# Patient Record
Sex: Female | Born: 1935 | Race: White | Hispanic: No | Marital: Married | Smoking: Never smoker
Health system: Southern US, Community
[De-identification: ages and names within clinical notes are randomized; demographics above are authoritative.]

## PROBLEM LIST (undated history)

## (undated) DIAGNOSIS — I714 Abdominal aortic aneurysm, without rupture, unspecified: Secondary | ICD-10-CM

## (undated) DIAGNOSIS — D492 Neoplasm of unspecified behavior of bone, soft tissue, and skin: Secondary | ICD-10-CM

## (undated) DIAGNOSIS — M79672 Pain in left foot: Secondary | ICD-10-CM

## (undated) DIAGNOSIS — R51 Headache: Secondary | ICD-10-CM

## (undated) DIAGNOSIS — M1990 Unspecified osteoarthritis, unspecified site: Secondary | ICD-10-CM

## (undated) DIAGNOSIS — E559 Vitamin D deficiency, unspecified: Secondary | ICD-10-CM

## (undated) DIAGNOSIS — I2510 Atherosclerotic heart disease of native coronary artery without angina pectoris: Secondary | ICD-10-CM

## (undated) DIAGNOSIS — E782 Mixed hyperlipidemia: Secondary | ICD-10-CM

## (undated) DIAGNOSIS — I493 Ventricular premature depolarization: Secondary | ICD-10-CM

## (undated) DIAGNOSIS — M899 Disorder of bone, unspecified: Secondary | ICD-10-CM

## (undated) DIAGNOSIS — R0689 Other abnormalities of breathing: Secondary | ICD-10-CM

## (undated) DIAGNOSIS — N959 Unspecified menopausal and perimenopausal disorder: Secondary | ICD-10-CM

## (undated) DIAGNOSIS — M949 Disorder of cartilage, unspecified: Secondary | ICD-10-CM

## (undated) DIAGNOSIS — D3709 Neoplasm of uncertain behavior of other specified sites of the oral cavity: Secondary | ICD-10-CM

## (undated) DIAGNOSIS — E785 Hyperlipidemia, unspecified: Secondary | ICD-10-CM

## (undated) DIAGNOSIS — I10 Essential (primary) hypertension: Secondary | ICD-10-CM

## (undated) DIAGNOSIS — N815 Vaginal enterocele: Secondary | ICD-10-CM

## (undated) DIAGNOSIS — R0602 Shortness of breath: Secondary | ICD-10-CM

## (undated) DIAGNOSIS — N3941 Urge incontinence: Secondary | ICD-10-CM

## (undated) DIAGNOSIS — Z7982 Long term (current) use of aspirin: Secondary | ICD-10-CM

## (undated) DIAGNOSIS — R002 Palpitations: Secondary | ICD-10-CM

## (undated) DIAGNOSIS — M8580 Other specified disorders of bone density and structure, unspecified site: Secondary | ICD-10-CM

## (undated) DIAGNOSIS — M25872 Other specified joint disorders, left ankle and foot: Secondary | ICD-10-CM

## (undated) DIAGNOSIS — R7309 Other abnormal glucose: Secondary | ICD-10-CM

## (undated) DIAGNOSIS — R0600 Dyspnea, unspecified: Secondary | ICD-10-CM

## (undated) DIAGNOSIS — E039 Hypothyroidism, unspecified: Secondary | ICD-10-CM

## (undated) DIAGNOSIS — R7303 Prediabetes: Secondary | ICD-10-CM

## (undated) DIAGNOSIS — R319 Hematuria, unspecified: Secondary | ICD-10-CM

## (undated) DIAGNOSIS — R030 Elevated blood-pressure reading, without diagnosis of hypertension: Secondary | ICD-10-CM

## (undated) DIAGNOSIS — R5381 Other malaise: Secondary | ICD-10-CM

## (undated) DIAGNOSIS — R0989 Other specified symptoms and signs involving the circulatory and respiratory systems: Secondary | ICD-10-CM

## (undated) DIAGNOSIS — Z01818 Encounter for other preprocedural examination: Secondary | ICD-10-CM

## (undated) DIAGNOSIS — R5383 Other fatigue: Secondary | ICD-10-CM

## (undated) DIAGNOSIS — J301 Allergic rhinitis due to pollen: Secondary | ICD-10-CM

## (undated) DIAGNOSIS — I471 Supraventricular tachycardia: Secondary | ICD-10-CM

## (undated) DIAGNOSIS — I658 Occlusion and stenosis of other precerebral arteries: Secondary | ICD-10-CM

## (undated) DIAGNOSIS — Z01419 Encounter for gynecological examination (general) (routine) without abnormal findings: Secondary | ICD-10-CM

## (undated) DIAGNOSIS — E669 Obesity, unspecified: Secondary | ICD-10-CM

## (undated) DIAGNOSIS — R600 Localized edema: Secondary | ICD-10-CM

## (undated) DIAGNOSIS — I6529 Occlusion and stenosis of unspecified carotid artery: Secondary | ICD-10-CM

## (undated) DIAGNOSIS — I6523 Occlusion and stenosis of bilateral carotid arteries: Secondary | ICD-10-CM

## (undated) DIAGNOSIS — M19079 Primary osteoarthritis, unspecified ankle and foot: Secondary | ICD-10-CM

## (undated) DIAGNOSIS — D3701 Neoplasm of uncertain behavior of lip: Secondary | ICD-10-CM

## (undated) DIAGNOSIS — N8111 Cystocele, midline: Secondary | ICD-10-CM

## (undated) DIAGNOSIS — Z79899 Other long term (current) drug therapy: Secondary | ICD-10-CM

## (undated) DIAGNOSIS — R9430 Abnormal result of cardiovascular function study, unspecified: Secondary | ICD-10-CM

## (undated) DIAGNOSIS — R55 Syncope and collapse: Secondary | ICD-10-CM

## (undated) DIAGNOSIS — K599 Functional intestinal disorder, unspecified: Secondary | ICD-10-CM

## (undated) DIAGNOSIS — N200 Calculus of kidney: Secondary | ICD-10-CM

## (undated) DIAGNOSIS — R079 Chest pain, unspecified: Secondary | ICD-10-CM

## (undated) DIAGNOSIS — N8110 Cystocele, unspecified: Secondary | ICD-10-CM

## (undated) DIAGNOSIS — N8112 Cystocele, lateral: Secondary | ICD-10-CM

## (undated) DIAGNOSIS — D3705 Neoplasm of uncertain behavior of pharynx: Secondary | ICD-10-CM

## (undated) HISTORY — DX: Abdominal aortic aneurysm, without rupture, unspecified: I71.40

## (undated) HISTORY — DX: Neoplasm of unspecified behavior of bone, soft tissue, and skin: D49.2

## (undated) HISTORY — DX: Chest pain, unspecified: R07.9

## (undated) HISTORY — DX: Hyperlipidemia, unspecified: E78.5

## (undated) HISTORY — DX: Cystocele, unspecified: N81.10

## (undated) HISTORY — DX: Hematuria, unspecified: R31.9

## (undated) HISTORY — DX: Urge incontinence: N39.41

## (undated) HISTORY — DX: Other specified joint disorders, left ankle and foot: M25.872

## (undated) HISTORY — PX: CYSTOSCOPY: SUR368

## (undated) HISTORY — DX: Headache: R51

## (undated) HISTORY — DX: Morbid (severe) obesity due to excess calories: E66.01

## (undated) HISTORY — PX: REPLACEMENT TOTAL KNEE: SUR1224

## (undated) HISTORY — DX: Hypothyroidism, unspecified: E03.9

## (undated) HISTORY — DX: Elevated blood-pressure reading, without diagnosis of hypertension: R03.0

## (undated) HISTORY — DX: Calculus of kidney: N20.0

## (undated) HISTORY — DX: Other malaise: R53.81

## (undated) HISTORY — DX: Palpitations: R00.2

## (undated) HISTORY — DX: Neoplasm of uncertain behavior of pharynx: D37.05

## (undated) HISTORY — DX: Vitamin D deficiency, unspecified: E55.9

## (undated) HISTORY — DX: Mixed hyperlipidemia: E78.2

## (undated) HISTORY — DX: Unspecified osteoarthritis, unspecified site: M19.90

## (undated) HISTORY — DX: Long term (current) use of aspirin: Z79.82

## (undated) HISTORY — DX: Allergic rhinitis due to pollen: J30.1

## (undated) HISTORY — DX: Encounter for other preprocedural examination: Z01.818

## (undated) HISTORY — DX: Cystocele, midline: N81.11

## (undated) HISTORY — DX: Occlusion and stenosis of bilateral carotid arteries: I65.23

## (undated) HISTORY — DX: Unspecified menopausal and perimenopausal disorder: N95.9

## (undated) HISTORY — DX: Dyspnea, unspecified: R06.00

## (undated) HISTORY — DX: Essential (primary) hypertension: I10

## (undated) HISTORY — DX: Localized edema: R60.0

## (undated) HISTORY — DX: Other specified disorders of bone density and structure, unspecified site: M85.80

## (undated) HISTORY — DX: Supraventricular tachycardia: I47.1

## (undated) HISTORY — DX: Ventricular premature depolarization: I49.3

## (undated) HISTORY — DX: Primary osteoarthritis, unspecified ankle and foot: M19.079

## (undated) HISTORY — DX: Other long term (current) drug therapy: Z79.899

## (undated) HISTORY — DX: Other specified symptoms and signs involving the circulatory and respiratory systems: R09.89

## (undated) HISTORY — DX: Neoplasm of uncertain behavior of other specified sites of the oral cavity: D37.09

## (undated) HISTORY — DX: Shortness of breath: R06.02

## (undated) HISTORY — DX: Encounter for gynecological examination (general) (routine) without abnormal findings: Z01.419

## (undated) HISTORY — DX: Prediabetes: R73.03

## (undated) HISTORY — DX: Vaginal enterocele: N81.5

## (undated) HISTORY — DX: Other abnormal glucose: R73.09

## (undated) HISTORY — DX: Functional intestinal disorder, unspecified: K59.9

## (undated) HISTORY — DX: Other fatigue: R53.83

## (undated) HISTORY — DX: Abdominal aortic aneurysm, without rupture: I71.4

## (undated) HISTORY — DX: Neoplasm of uncertain behavior of lip: D37.01

## (undated) HISTORY — DX: Cystocele, lateral: N81.12

## (undated) HISTORY — DX: Occlusion and stenosis of unspecified carotid artery: I65.29

## (undated) HISTORY — DX: Abnormal result of cardiovascular function study, unspecified: R94.30

## (undated) HISTORY — DX: Syncope and collapse: R55

## (undated) HISTORY — DX: Occlusion and stenosis of other precerebral arteries: I65.8

## (undated) HISTORY — DX: Pain in left foot: M79.672

## (undated) HISTORY — DX: Disorder of bone, unspecified: M89.9

## (undated) HISTORY — DX: Disorder of cartilage, unspecified: M94.9

## (undated) HISTORY — DX: Other abnormalities of breathing: R06.89

## (undated) HISTORY — DX: Obesity, unspecified: E66.9

---

## 1998-06-03 ENCOUNTER — Emergency Department (HOSPITAL_COMMUNITY): Admission: EM | Admit: 1998-06-03 | Discharge: 1998-06-03 | Payer: Self-pay | Admitting: Emergency Medicine

## 1998-06-03 ENCOUNTER — Encounter: Payer: Self-pay | Admitting: Emergency Medicine

## 2004-07-15 ENCOUNTER — Emergency Department (HOSPITAL_COMMUNITY): Admission: EM | Admit: 2004-07-15 | Discharge: 2004-07-15 | Payer: Self-pay | Admitting: Podiatry

## 2004-12-05 ENCOUNTER — Encounter: Admission: RE | Admit: 2004-12-05 | Discharge: 2004-12-05 | Payer: Self-pay | Admitting: Family Medicine

## 2005-12-10 ENCOUNTER — Encounter: Admission: RE | Admit: 2005-12-10 | Discharge: 2005-12-10 | Payer: Self-pay | Admitting: Family Medicine

## 2006-12-25 ENCOUNTER — Encounter: Admission: RE | Admit: 2006-12-25 | Discharge: 2006-12-25 | Payer: Self-pay | Admitting: Family Medicine

## 2007-12-26 ENCOUNTER — Encounter: Admission: RE | Admit: 2007-12-26 | Discharge: 2007-12-26 | Payer: Self-pay | Admitting: Family Medicine

## 2009-01-03 ENCOUNTER — Encounter: Admission: RE | Admit: 2009-01-03 | Discharge: 2009-01-03 | Payer: Self-pay | Admitting: Family Medicine

## 2010-01-05 ENCOUNTER — Encounter: Admission: RE | Admit: 2010-01-05 | Discharge: 2010-01-05 | Payer: Self-pay | Admitting: Family Medicine

## 2010-12-13 ENCOUNTER — Other Ambulatory Visit: Payer: Self-pay | Admitting: Family Medicine

## 2010-12-13 DIAGNOSIS — Z1231 Encounter for screening mammogram for malignant neoplasm of breast: Secondary | ICD-10-CM

## 2011-01-17 ENCOUNTER — Ambulatory Visit: Payer: Self-pay

## 2011-01-22 ENCOUNTER — Ambulatory Visit
Admission: RE | Admit: 2011-01-22 | Discharge: 2011-01-22 | Disposition: A | Discharge status: Home / Self Care | Payer: Medicare Other | Source: Ambulatory Visit | Attending: Family Medicine | Admitting: Family Medicine

## 2011-01-22 DIAGNOSIS — Z1231 Encounter for screening mammogram for malignant neoplasm of breast: Secondary | ICD-10-CM

## 2011-11-02 DIAGNOSIS — D3701 Neoplasm of uncertain behavior of lip: Secondary | ICD-10-CM | POA: Insufficient documentation

## 2011-11-02 DIAGNOSIS — D3709 Neoplasm of uncertain behavior of other specified sites of the oral cavity: Secondary | ICD-10-CM

## 2011-11-02 DIAGNOSIS — D3705 Neoplasm of uncertain behavior of pharynx: Secondary | ICD-10-CM

## 2011-11-02 DIAGNOSIS — J301 Allergic rhinitis due to pollen: Secondary | ICD-10-CM

## 2011-11-02 HISTORY — DX: Allergic rhinitis due to pollen: J30.1

## 2011-11-02 HISTORY — DX: Neoplasm of uncertain behavior of lip: D37.01

## 2012-03-07 ENCOUNTER — Other Ambulatory Visit: Payer: Self-pay | Admitting: Family Medicine

## 2012-03-07 DIAGNOSIS — Z1231 Encounter for screening mammogram for malignant neoplasm of breast: Secondary | ICD-10-CM

## 2012-04-16 ENCOUNTER — Ambulatory Visit
Admission: RE | Admit: 2012-04-16 | Discharge: 2012-04-16 | Disposition: A | Discharge status: Home / Self Care | Payer: Medicare Other | Source: Ambulatory Visit | Attending: Family Medicine | Admitting: Family Medicine

## 2012-04-16 DIAGNOSIS — Z1231 Encounter for screening mammogram for malignant neoplasm of breast: Secondary | ICD-10-CM

## 2012-12-09 DIAGNOSIS — R0689 Other abnormalities of breathing: Secondary | ICD-10-CM

## 2012-12-09 DIAGNOSIS — R5383 Other fatigue: Secondary | ICD-10-CM

## 2012-12-09 DIAGNOSIS — I658 Occlusion and stenosis of other precerebral arteries: Secondary | ICD-10-CM

## 2012-12-09 DIAGNOSIS — I10 Essential (primary) hypertension: Secondary | ICD-10-CM | POA: Insufficient documentation

## 2012-12-09 DIAGNOSIS — R5381 Other malaise: Secondary | ICD-10-CM

## 2012-12-09 DIAGNOSIS — R0989 Other specified symptoms and signs involving the circulatory and respiratory systems: Secondary | ICD-10-CM

## 2012-12-09 DIAGNOSIS — R0600 Dyspnea, unspecified: Secondary | ICD-10-CM | POA: Insufficient documentation

## 2012-12-09 DIAGNOSIS — I6529 Occlusion and stenosis of unspecified carotid artery: Secondary | ICD-10-CM | POA: Insufficient documentation

## 2012-12-09 DIAGNOSIS — Z7982 Long term (current) use of aspirin: Secondary | ICD-10-CM

## 2012-12-09 DIAGNOSIS — R030 Elevated blood-pressure reading, without diagnosis of hypertension: Secondary | ICD-10-CM | POA: Insufficient documentation

## 2012-12-09 DIAGNOSIS — R519 Headache, unspecified: Secondary | ICD-10-CM

## 2012-12-09 DIAGNOSIS — R079 Chest pain, unspecified: Secondary | ICD-10-CM

## 2012-12-09 DIAGNOSIS — Z79899 Other long term (current) drug therapy: Secondary | ICD-10-CM

## 2012-12-09 DIAGNOSIS — R0602 Shortness of breath: Secondary | ICD-10-CM | POA: Insufficient documentation

## 2012-12-09 DIAGNOSIS — R9430 Abnormal result of cardiovascular function study, unspecified: Secondary | ICD-10-CM

## 2012-12-09 DIAGNOSIS — Z01818 Encounter for other preprocedural examination: Secondary | ICD-10-CM

## 2012-12-09 DIAGNOSIS — I6523 Occlusion and stenosis of bilateral carotid arteries: Secondary | ICD-10-CM | POA: Insufficient documentation

## 2012-12-09 DIAGNOSIS — R51 Headache: Secondary | ICD-10-CM

## 2012-12-09 HISTORY — DX: Essential (primary) hypertension: I10

## 2012-12-09 HISTORY — DX: Dyspnea, unspecified: R06.00

## 2012-12-09 HISTORY — DX: Occlusion and stenosis of unspecified carotid artery: I65.29

## 2012-12-09 HISTORY — DX: Dyspnea, unspecified: R06.89

## 2012-12-09 HISTORY — DX: Occlusion and stenosis of other precerebral arteries: I65.8

## 2012-12-09 HISTORY — DX: Abnormal result of cardiovascular function study, unspecified: R94.30

## 2012-12-09 HISTORY — DX: Elevated blood-pressure reading, without diagnosis of hypertension: R03.0

## 2012-12-09 HISTORY — DX: Other fatigue: R53.83

## 2012-12-09 HISTORY — DX: Occlusion and stenosis of bilateral carotid arteries: I65.23

## 2012-12-09 HISTORY — DX: Other long term (current) drug therapy: Z79.899

## 2012-12-09 HISTORY — DX: Chest pain, unspecified: R07.9

## 2012-12-09 HISTORY — DX: Long term (current) use of aspirin: Z79.82

## 2012-12-09 HISTORY — DX: Encounter for other preprocedural examination: Z01.818

## 2012-12-09 HISTORY — DX: Shortness of breath: R06.02

## 2012-12-09 HISTORY — DX: Other malaise: R53.81

## 2012-12-09 HISTORY — DX: Headache, unspecified: R51.9

## 2012-12-09 HISTORY — DX: Other specified symptoms and signs involving the circulatory and respiratory systems: R09.89

## 2013-05-04 ENCOUNTER — Other Ambulatory Visit: Payer: Self-pay

## 2013-05-04 DIAGNOSIS — Z1231 Encounter for screening mammogram for malignant neoplasm of breast: Secondary | ICD-10-CM

## 2013-06-05 ENCOUNTER — Ambulatory Visit
Admission: RE | Admit: 2013-06-05 | Discharge: 2013-06-05 | Disposition: A | Discharge status: Home / Self Care | Payer: Medicare Other | Source: Ambulatory Visit

## 2013-06-05 DIAGNOSIS — Z1231 Encounter for screening mammogram for malignant neoplasm of breast: Secondary | ICD-10-CM

## 2013-10-05 ENCOUNTER — Encounter: Payer: Self-pay | Admitting: Cardiovascular Disease

## 2013-10-05 ENCOUNTER — Ambulatory Visit (INDEPENDENT_AMBULATORY_CARE_PROVIDER_SITE_OTHER): Payer: Medicare Other | Admitting: Cardiovascular Disease

## 2013-10-05 VITALS — BP 150/94 | HR 78 | Ht 64.0 in | Wt 214.0 lb

## 2013-10-05 DIAGNOSIS — R5383 Other fatigue: Secondary | ICD-10-CM

## 2013-10-05 DIAGNOSIS — R55 Syncope and collapse: Secondary | ICD-10-CM

## 2013-10-05 DIAGNOSIS — R002 Palpitations: Secondary | ICD-10-CM

## 2013-10-05 DIAGNOSIS — I10 Essential (primary) hypertension: Secondary | ICD-10-CM

## 2013-10-05 DIAGNOSIS — R5381 Other malaise: Secondary | ICD-10-CM

## 2013-10-05 HISTORY — DX: Palpitations: R00.2

## 2013-10-05 HISTORY — DX: Essential (primary) hypertension: I10

## 2013-10-05 HISTORY — DX: Syncope and collapse: R55

## 2013-10-05 MED ORDER — ACEBUTOLOL HCL 200 MG PO CAPS: 200.0000 mg | ORAL_CAPSULE | Freq: Every day | ORAL | Status: DC

## 2013-10-05 NOTE — Patient Instructions (Signed)
Your physician recommends that you schedule a follow-up appointment in: [place] physician has recommended you make the following change in your medication:  STOP  BYSTOLIC  START SECTRAL  546 MG  [place] physician has recommended that you wear an event monitor. Event monitors are medical devices that record the heart's electrical activity. Doctors most often [place] these monitors to diagnose arrhythmias. Arrhythmias are problems with the speed or rhythm of the heartbeat. The monitor is a small, portable device. You can wear one while you do your normal daily activities. This is usually used to diagnose what is causing palpitations/syncope (passing out).  Your physician has requested that you have an echocardiogram. Echocardiography is a painless test that uses sound waves to create images of your heart. It provides your doctor with information about the size and shape of your heart and how well your heart's chambers and valves are working. This procedure takes approximately one hour. There are no restrictions for this procedure.

## 2013-10-05 NOTE — Progress Notes (Signed)
Patient ID: Shannon Walker, female   DOB: September 02, 1932, 80 y.o.   MRN: [id number]   80 yo referred by Dr Bea Graff for palpitations.  She has HTN Rx with [employer] giving her hard time about it.  Previously seen by [facility] with normal stress tests and event monitor ? Results.  She has no documented cardiac disease.  She has flip flops mostly at night No sycope She also has these stereotypical spells last month where "something comes over me"  Feels drained and like she is going to pass out. Lasts a few minutes then she's tired all day  Not related to palpitations or postural changes.  No chest pain or dyspnea She seem a bit depressed and at times tired of caring for husband.  She wants to change bystolic.  Apparently insurance is giving her problem  Been on it for years for HTN  Borderline DM From obesity with A1c6.1 No other CAD risk factors   Hct 45.6  Cr .66  LDL 115 TSH 1.4      ROS: Denies fever, malais, weight loss, blurry vision, decreased visual acuity, cough, sputum, SOB, hemoptysis, pleuritic pain, palpitaitons, heartburn, abdominal pain, melena, lower extremity edema, claudication, or rash.  All other systems reviewed and negative   General: Affect appropriate Healthy:  appears stated age 80: normal Neck supple with no adenopathy JVP normal no bruits no thyromegaly Lungs clear with no wheezing and good diaphragmatic motion Heart:  S1/S2 no murmur,rub, gallop or click PMI normal Abdomen: benighn, BS positve, no tenderness, no AAA no bruit.  No HSM or HJR Distal pulses intact with no bruits No edema Neuro non-focal Skin warm and dry No muscular weakness  Medications Current Outpatient Prescriptions  Medication Sig Dispense Refill  . Cholecalciferol (VITAMIN D PO) Take by mouth.      . levothyroxine (SYNTHROID, LEVOTHROID) 100 MCG tablet Take 100 mcg by mouth daily before breakfast.      . nebivolol (BYSTOLIC) 5 MG tablet Take 5 mg by mouth daily.      . Bromfenac  Sodium (PROLENSA) 0.07 % SOLN Apply to eye daily.      Marland Kitchen gatifloxacin (ZYMAXID) 0.5 % SOLN 1 drop.       No current facility-administered medications for this visit.    Allergies Iodine  Family History: No family history on file.  Social History: History   Social History  . Marital Status: Married    Spouse Name: N/A    Number of Children: N/A  . Years of Education: N/A   Occupational History  . Not on file.   Social History Main Topics  . Smoking status: Never Smoker   . Smokeless tobacco: Not on file  . Alcohol Use: Not on file  . Drug Use: Not on file  . Sexual Activity: Not on file   Other Topics Concern  . Not on file   Social History Narrative  . No narrative on file    Electrocardiogram:  SR rate 78 poor R wave progression cannot r/o anterior MI   Assessment and Plan

## 2013-10-05 NOTE — Assessment & Plan Note (Signed)
Not clear why sympathomimetic agent chosen in first place.  She wants a new one  Will see if sectral is covered by insurance

## 2013-10-05 NOTE — Assessment & Plan Note (Signed)
Benign sounding non exertional and more at night  Event monitor

## 2013-10-05 NOTE — Assessment & Plan Note (Signed)
Etiology not clear with normal exam and no dyspnea or chest pain doubt cardiac etiology Check event monitior  and echo

## 2013-10-14 ENCOUNTER — Encounter (INDEPENDENT_AMBULATORY_CARE_PROVIDER_SITE_OTHER): Payer: Medicare Other

## 2013-10-14 ENCOUNTER — Ambulatory Visit (HOSPITAL_COMMUNITY): Payer: Medicare Other | Attending: Internal Medicine | Admitting: Cardiology

## 2013-10-14 ENCOUNTER — Encounter: Payer: Self-pay | Admitting: *Deleted

## 2013-10-14 DIAGNOSIS — R002 Palpitations: Secondary | ICD-10-CM

## 2013-10-14 DIAGNOSIS — R55 Syncope and collapse: Secondary | ICD-10-CM

## 2013-10-14 DIAGNOSIS — R5383 Other fatigue: Secondary | ICD-10-CM

## 2013-10-14 NOTE — Progress Notes (Signed)
Patient ID: Shannon Walker, female   DOB: 31-Aug-1932, 80 y.o.   MRN: [id number] E-Cardio verite 30 day cardiac event monitor applied to patient.

## 2013-10-14 NOTE — Progress Notes (Signed)
Echo performed. 

## 2013-11-20 ENCOUNTER — Telehealth: Payer: Self-pay | Admitting: *Deleted

## 2013-11-20 NOTE — Telephone Encounter (Signed)
PT AWARE OF MONITOR  RESULTS  PER  DR NISHAN  NSR  ARTIFACT  NO ARRHYTHMIA ./CY

## 2014-01-08 ENCOUNTER — Telehealth: Payer: Self-pay | Admitting: Cardiovascular Disease

## 2014-01-08 NOTE — Telephone Encounter (Signed)
CALLED OFFICE   RECORDING  STATES  OFFICE IS CLOSED FOR THE  DAY .Adonis Housekeeper

## 2014-01-08 NOTE — Telephone Encounter (Signed)
New message      Question about patients bp medication

## 2014-01-12 NOTE — Telephone Encounter (Signed)
CLOSED  FOR  LUNCH WILL TRY LATER .Shannon Walker

## 2014-01-15 NOTE — Telephone Encounter (Signed)
SPOKE WITH  [facility]  PT  THINKS   THAT   CURRENT MED  BYSTOLIC  IS NOT  THE PREFERRED  CHOICE  OF   DR Johnsie Cancel  . REVIEWED  OFFICE  NOTE APPEARS   THE  MAIN   ISSUE  IS  INS  COVERING  MED  PER  ANGIE  WILL DISCUSS WITH  PT .Adonis Housekeeper

## 2014-06-01 ENCOUNTER — Other Ambulatory Visit: Payer: Self-pay | Admitting: Internal Medicine

## 2014-06-01 DIAGNOSIS — R42 Dizziness and giddiness: Secondary | ICD-10-CM

## 2014-06-02 ENCOUNTER — Ambulatory Visit
Admission: RE | Admit: 2014-06-02 | Discharge: 2014-06-02 | Disposition: A | Discharge status: Home / Self Care | Payer: Medicare Other | Source: Ambulatory Visit | Attending: Internal Medicine | Admitting: Internal Medicine

## 2014-06-02 DIAGNOSIS — R42 Dizziness and giddiness: Secondary | ICD-10-CM

## 2014-06-02 MED ORDER — GADOBENATE DIMEGLUMINE 529 MG/ML IV SOLN
19.0000 mL | Freq: Once | INTRAVENOUS | Status: AC | PRN
Start: 2014-06-02 — End: 2014-06-02
  Administered 2014-06-02: 19 mL via INTRAVENOUS

## 2014-06-30 ENCOUNTER — Encounter (HOSPITAL_COMMUNITY): Payer: Self-pay | Admitting: Physical Medicine and Rehabilitation

## 2014-06-30 ENCOUNTER — Emergency Department (HOSPITAL_COMMUNITY): Payer: Medicare Other

## 2014-06-30 ENCOUNTER — Emergency Department (HOSPITAL_COMMUNITY)
Admission: EM | Admit: 2014-06-30 | Discharge: 2014-06-30 | Disposition: A | Discharge status: Home / Self Care | Payer: Medicare Other | Attending: Emergency Medicine | Admitting: Emergency Medicine

## 2014-06-30 DIAGNOSIS — Z87442 Personal history of urinary calculi: Secondary | ICD-10-CM | POA: Diagnosis not present

## 2014-06-30 DIAGNOSIS — Z79899 Other long term (current) drug therapy: Secondary | ICD-10-CM | POA: Insufficient documentation

## 2014-06-30 DIAGNOSIS — Z8639 Personal history of other endocrine, nutritional and metabolic disease: Secondary | ICD-10-CM | POA: Insufficient documentation

## 2014-06-30 DIAGNOSIS — Z87448 Personal history of other diseases of urinary system: Secondary | ICD-10-CM | POA: Insufficient documentation

## 2014-06-30 DIAGNOSIS — R002 Palpitations: Secondary | ICD-10-CM

## 2014-06-30 DIAGNOSIS — Z8739 Personal history of other diseases of the musculoskeletal system and connective tissue: Secondary | ICD-10-CM | POA: Insufficient documentation

## 2014-06-30 DIAGNOSIS — I10 Essential (primary) hypertension: Secondary | ICD-10-CM | POA: Insufficient documentation

## 2014-06-30 DIAGNOSIS — Z85828 Personal history of other malignant neoplasm of skin: Secondary | ICD-10-CM | POA: Diagnosis not present

## 2014-06-30 DIAGNOSIS — Z7982 Long term (current) use of aspirin: Secondary | ICD-10-CM | POA: Diagnosis not present

## 2014-06-30 LAB — CBC WITH DIFFERENTIAL/PLATELET
BASOS ABS: 0 10*3/uL (ref 0.0–0.1)
BASOS PCT: 0 % (ref 0–1)
EOS ABS: 0.3 10*3/uL (ref 0.0–0.7)
Eosinophils Relative: 4 % (ref 0–5)
HEMATOCRIT: 44.2 % (ref 36.0–46.0)
HEMOGLOBIN: 15.1 g/dL — AB (ref 12.0–15.0)
Lymphocytes Relative: 42 % (ref 12–46)
Lymphs Abs: 2.7 10*3/uL (ref 0.7–4.0)
MCH: 33 pg (ref 26.0–34.0)
MCHC: 34.2 g/dL (ref 30.0–36.0)
MCV: 96.5 fL (ref 78.0–100.0)
MONOS PCT: 9 % (ref 3–12)
Monocytes Absolute: 0.6 10*3/uL (ref 0.1–1.0)
NEUTROS PCT: 45 % (ref 43–77)
Neutro Abs: 2.8 10*3/uL (ref 1.7–7.7)
Platelets: 271 10*3/uL (ref 150–400)
RBC: 4.58 MIL/uL (ref 3.87–5.11)
RDW: 13.1 % (ref 11.5–15.5)
WBC: 6.5 10*3/uL (ref 4.0–10.5)

## 2014-06-30 LAB — COMPREHENSIVE METABOLIC PANEL
ALBUMIN: 3.8 g/dL (ref 3.5–5.2)
ALT: 26 U/L (ref 0–35)
AST: 27 U/L (ref 0–37)
Alkaline Phosphatase: 87 U/L (ref 39–117)
Anion gap: 6 (ref 5–15)
BUN: 11 mg/dL (ref 6–23)
CALCIUM: 9 mg/dL (ref 8.4–10.5)
CO2: 28 mmol/L (ref 19–32)
CREATININE: 0.75 mg/dL (ref 0.50–1.10)
Chloride: 105 mmol/L (ref 96–112)
GFR calc non Af Amer: 78 mL/min — ABNORMAL LOW (ref >90)
GLUCOSE: 158 mg/dL — AB (ref 70–99)
POTASSIUM: 3.7 mmol/L (ref 3.5–5.1)
SODIUM: 139 mmol/L (ref 135–145)
TOTAL PROTEIN: 7.2 g/dL (ref 6.0–8.3)
Total Bilirubin: 0.2 mg/dL — ABNORMAL LOW (ref 0.3–1.2)

## 2014-06-30 LAB — I-STAT TROPONIN, ED: TROPONIN I, POC: 0 ng/mL (ref 0.00–0.08)

## 2014-06-30 LAB — D-DIMER, QUANTITATIVE: D-Dimer, Quant: 0.56 ug/mL-FEU — ABNORMAL HIGH (ref 0.00–0.48)

## 2014-06-30 LAB — BRAIN NATRIURETIC PEPTIDE: B Natriuretic Peptide: 16.9 pg/mL (ref 0.0–100.0)

## 2014-06-30 MED ORDER — ACEBUTOLOL HCL 200 MG PO CAPS: 200.0000 mg | ORAL_CAPSULE | Freq: Every day | ORAL | Status: DC

## 2014-06-30 NOTE — Discharge Instructions (Signed)
See Dr. Tamala Julian or Dr. Johnsie Cancel for follow up care and treatment in 2-3 weeks.   Palpitations A palpitation is the feeling that your heartbeat is irregular or is faster than normal. It may feel like your heart is fluttering or skipping a beat. Palpitations are usually not a serious problem. However, in some cases, you may need further medical evaluation. CAUSES  Palpitations can be caused by:  Smoking.  Caffeine or other stimulants, such as diet pills or energy drinks.  Alcohol.  Stress and anxiety.  Strenuous physical activity.  Fatigue.  Certain medicines.  Heart disease, especially if you have a history of irregular heart rhythms (arrhythmias), such as atrial fibrillation, atrial flutter, or supraventricular tachycardia.  An improperly working pacemaker or defibrillator. DIAGNOSIS  To find the cause of your palpitations, your health care provider will take your medical history and perform a physical exam. Your health care provider may also have you take a test called an ambulatory electrocardiogram (ECG). An ECG records your heartbeat patterns over a 24-hour period. You may also have other tests, such as:  Transthoracic echocardiogram (TTE). During echocardiography, sound waves are used to evaluate how blood flows through your heart.  Transesophageal echocardiogram (TEE).  Cardiac monitoring. This allows your health care provider to monitor your heart rate and rhythm in real time.  Holter monitor. This is a portable device that records your heartbeat and can help diagnose heart arrhythmias. It allows your health care provider to track your heart activity for several days, if needed.  Stress tests by exercise or by giving medicine that makes the heart beat faster. TREATMENT  Treatment of palpitations depends on the cause of your symptoms and can vary greatly. Most cases of palpitations do not require any treatment other than time, relaxation, and monitoring your symptoms. Other  causes, such as atrial fibrillation, atrial flutter, or supraventricular tachycardia, usually require further treatment. HOME CARE INSTRUCTIONS   Avoid:  Caffeinated coffee, tea, soft drinks, diet pills, and energy drinks.  Chocolate.  Alcohol.  Stop smoking if you smoke.  Reduce your stress and anxiety. Things that can help you relax include:  A method of controlling things in your body, such as your heartbeats, with your mind (biofeedback).  Yoga.  Meditation.  Physical activity such as swimming, jogging, or walking.  Get plenty of rest and sleep. SEEK MEDICAL CARE IF:   You continue to have a fast or irregular heartbeat beyond 24 hours.  Your palpitations occur more often. SEEK IMMEDIATE MEDICAL CARE IF:  You have chest pain or shortness of breath.  You have a severe headache.  You feel dizzy or you faint. MAKE SURE YOU:  Understand these instructions.  Will watch your condition.  Will get help right away if you are not doing well or get worse. Document Released: 05/11/2000 Document Revised: 05/19/2013 Document Reviewed: 07/13/2011 [facility] Patient Information 2015 [place], [place]. This information is not intended to replace advice given to you by your health care provider. Make sure you discuss any questions you have with your health care provider.

## 2014-06-30 NOTE — ED Notes (Signed)
Pt presents to department for evaluation of palpitations. Pt states she feels like her heart is "racing." symptoms ongoing intermittently for several months. Denies chest pain. Respirations unlabored. Pt is alert and oriented x4.

## 2014-06-30 NOTE — ED Notes (Signed)
Pt taken to rest room in a wheelchair by this nurse.

## 2014-06-30 NOTE — ED Provider Notes (Addendum)
CSN: [id number]     Arrival date & time 06/30/14  0721 History   First MD Initiated Contact with Patient 06/30/14 [id number]     Chief Complaint  Patient presents with  . Palpitations     (Consider location/radiation/quality/duration/timing/severity/associated sxs/prior Treatment) HPI  Shannon Walker is a 81 y.o. female who is here for palpitations.  She notices palpitations, usually in the morning, but sometimes throughout the day, for many months.  She has previously been evaluated by her PCP, and a cardiologist.  She indicates that she is not happy with the assessment by her cardiologist.  He prescribed a medication, beta blocker, but she stopped taking it.  She denies any associated chest pain, weakness, dizziness, nausea, vomiting, cough or shortness of breath.  She took her usual morning medicines today, and ate some breakfast.  There are no other known modifying factors.    Past Medical History  Diagnosis Date  . Vaginal enterocele, congenital or acquired   . Unspecified functional disorder of intestine   . Cystocele, midline   . Cystocele, lateral   . Routine gynecological examination   . Hematuria, unspecified   . Other and unspecified hyperlipidemia   . Hypertension   . Unspecified hypothyroidism   . Other malaise and fatigue   . Unspecified menopausal and postmenopausal disorder   . Morbid obesity   . Calculus of kidney   . Localized osteoarthrosis not specified whether primary or secondary, unspecified site   . Disorder of bone and cartilage, unspecified   . Other abnormal glucose   . Neoplasm of unspecified nature of bone, soft tissue, and skin   . Urge incontinence   . Vitamin D deficiency    History reviewed. No pertinent past surgical history. No family history on file. History  Substance Use Topics  . Smoking status: Never Smoker   . Smokeless tobacco: Not on file  . Alcohol Use: No   OB History    No data available     Review of Systems  All other  systems reviewed and are negative.     Allergies  Iodine  Home Medications   Prior to Admission medications   Medication Sig Start Date End Date Taking? Authorizing Provider  amLODipine (NORVASC) 5 MG tablet Take 5 mg by mouth daily.   Yes Historical Provider, MD  aspirin EC 81 MG tablet Take 81 mg by mouth daily.   Yes Historical Provider, MD  levothyroxine (SYNTHROID, LEVOTHROID) 100 MCG tablet Take 100 mcg by mouth daily before breakfast.   Yes Historical Provider, MD  Vitamin D, Cholecalciferol, 400 UNITS CAPS Take 400 Units by mouth daily.   Yes Historical Provider, MD  acebutolol (SECTRAL) 200 MG capsule Take 1 capsule (200 mg total) by mouth daily. Patient not taking: Reported on 06/30/2014 10/05/13   Josue Hector, MD   BP 138/87 mmHg  Pulse 85  Temp(Src) 97.8 F (36.6 C) (Oral)  Resp 16  SpO2 98% Physical Exam  Constitutional: She is oriented to person, place, and time. She appears well-developed.  Elderly, frail  HENT:  Head: Normocephalic and atraumatic.  Right Ear: External ear normal.  Left Ear: External ear normal.  Eyes: Conjunctivae and EOM are normal. Pupils are equal, round, and reactive to light.  Neck: Normal range of motion and phonation normal. Neck supple.  Cardiovascular: Normal rate, regular rhythm and normal heart sounds.   Pulmonary/Chest: Effort normal and breath sounds normal. She exhibits no bony tenderness.  Abdominal: Soft. There is no tenderness.  Musculoskeletal: Normal range of motion. She exhibits no edema or tenderness.  Neurological: She is alert and oriented to person, place, and time. No cranial nerve deficit or sensory deficit. She exhibits normal muscle tone. Coordination normal.  Skin: Skin is warm, dry and intact.  Psychiatric: She has a normal mood and affect. Her behavior is normal. Judgment and thought content normal.  Nursing note and vitals reviewed.   ED Course  Procedures (including critical care time)  Documentation of  the medical records indicates that she was evaluated for this problem in May, last year, was prescribed acebutolol.  Medications - No data to display  Patient Vitals for the past 24 hrs:  BP Temp Temp src Pulse Resp SpO2  06/30/14 0915 126/70 mmHg - - 70 14 94 %  06/30/14 0900 118/74 mmHg - - 68 15 93 %  06/30/14 0845 114/65 mmHg - - 72 15 95 %  06/30/14 0830 139/76 mmHg - - 76 14 96 %  06/30/14 0815 121/69 mmHg - - 77 14 98 %  06/30/14 0755 138/87 mmHg - - 85 16 98 %  06/30/14 0726 148/84 mmHg 97.8 F (36.6 C) Oral 110 18 98 %    9:56 AM Reevaluation with update and discussion. After initial assessment and treatment, an updated evaluation reveals she is comfortable now.  She has not had any recurrent palpitations.  She is interested in restarting her Sectral, to treat palpitations.  Findings discussed with patient and husband, all questions answered. [place] Review Labs Reviewed  CBC WITH DIFFERENTIAL/PLATELET - Abnormal; Notable for the following:    Hemoglobin 15.1 (*)    All other components within normal limits  COMPREHENSIVE METABOLIC PANEL  D-DIMER, QUANTITATIVE  BRAIN NATRIURETIC PEPTIDE  I-STAT TROPOININ, ED    Imaging Review Dg Chest 2 View  06/30/2014   CLINICAL DATA:  On and off palpitations for the last few days - has had this issue in the past and doctors say that have not found anything wrong - nonsmoker, no heart or lung issues known, htn  EXAM: CHEST - 2 VIEW  COMPARISON:  None available  FINDINGS: Low lung volumes. Somewhat coarse interstitial markings with some patchy subsegmental atelectasis versus early interstitial infiltrates or scarring in the lung bases. No confluent airspace disease. Heart size upper limits normal for technique. Surgical clips near the GE junction and in the right upper abdomen. No pneumothorax. No effusion. There is a dextroscoliosis of before thoracolumbar spine.  IMPRESSION: 1. Low volumes with patchy bibasilar subsegmental  atelectasis versus early infiltrates.   Electronically Signed   By: Arne Cleveland M.D.   On: 06/30/2014 07:53     EKG Interpretation   Date/Time:  Wednesday June 30 2014 07:27:18 EST Ventricular Rate:  103 PR Interval:  132 QRS Duration: 64 QT Interval:  322 QTC Calculation: 421 R Axis:   -44 Text Interpretation:  Sinus tachycardia Left axis deviation Low voltage  QRS Possible Anterolateral infarct , age undetermined Abnormal ECG No old  tracing to compare Confirmed by [facility]  MD, Delila Kuklinski [phone]) on 06/30/2014  7:45:19 AM      MDM   Final diagnoses:  Palpitations      Palpitations, with noncompliance with prescribed medical treatment.  She has a normal age adjusted d-dimer. No evidence for ACS, PE, pneumonia, or metabolic instability.  Nursing Notes Reviewed/ Care Coordinated Applicable Imaging Reviewed Interpretation of Laboratory Data incorporated into ED treatment  The patient appears reasonably screened and/or stabilized for  discharge and I doubt any other medical condition or other [facility] requiring further screening, evaluation, or treatment in the ED at this time prior to discharge.  Plan: Home Medications- Restart Sectral; Home Treatments- rest; return here if the recommended treatment, does not improve the symptoms; Recommended follow up- Cardiology f/u in 2-3 weeks and prn     Richarda Blade, MD 06/30/14 [place], MD 06/30/14 1038

## 2014-08-19 ENCOUNTER — Other Ambulatory Visit: Payer: Self-pay

## 2014-08-19 DIAGNOSIS — Z1231 Encounter for screening mammogram for malignant neoplasm of breast: Secondary | ICD-10-CM

## 2014-08-25 ENCOUNTER — Ambulatory Visit
Admission: RE | Admit: 2014-08-25 | Discharge: 2014-08-25 | Disposition: A | Discharge status: Home / Self Care | Payer: Medicare Other | Source: Ambulatory Visit

## 2014-08-25 DIAGNOSIS — Z1231 Encounter for screening mammogram for malignant neoplasm of breast: Secondary | ICD-10-CM

## 2015-08-11 ENCOUNTER — Other Ambulatory Visit: Payer: Self-pay

## 2015-08-11 DIAGNOSIS — Z1231 Encounter for screening mammogram for malignant neoplasm of breast: Secondary | ICD-10-CM

## 2015-08-26 ENCOUNTER — Ambulatory Visit
Admission: RE | Admit: 2015-08-26 | Discharge: 2015-08-26 | Disposition: A | Discharge status: Home / Self Care | Payer: Medicare Other | Source: Ambulatory Visit

## 2015-08-26 DIAGNOSIS — Z1231 Encounter for screening mammogram for malignant neoplasm of breast: Secondary | ICD-10-CM

## 2015-09-16 DIAGNOSIS — M8580 Other specified disorders of bone density and structure, unspecified site: Secondary | ICD-10-CM | POA: Insufficient documentation

## 2015-09-16 DIAGNOSIS — E039 Hypothyroidism, unspecified: Secondary | ICD-10-CM

## 2015-09-16 DIAGNOSIS — R7303 Prediabetes: Secondary | ICD-10-CM

## 2015-09-16 DIAGNOSIS — E669 Obesity, unspecified: Secondary | ICD-10-CM

## 2015-09-16 DIAGNOSIS — E782 Mixed hyperlipidemia: Secondary | ICD-10-CM

## 2015-09-16 DIAGNOSIS — D492 Neoplasm of unspecified behavior of bone, soft tissue, and skin: Secondary | ICD-10-CM

## 2015-09-16 DIAGNOSIS — M19079 Primary osteoarthritis, unspecified ankle and foot: Secondary | ICD-10-CM

## 2015-09-16 DIAGNOSIS — N8110 Cystocele, unspecified: Secondary | ICD-10-CM | POA: Insufficient documentation

## 2015-09-16 DIAGNOSIS — N959 Unspecified menopausal and perimenopausal disorder: Secondary | ICD-10-CM | POA: Insufficient documentation

## 2015-09-16 DIAGNOSIS — E559 Vitamin D deficiency, unspecified: Secondary | ICD-10-CM | POA: Insufficient documentation

## 2015-09-16 HISTORY — DX: Other specified disorders of bone density and structure, unspecified site: M85.80

## 2015-09-16 HISTORY — DX: Prediabetes: R73.03

## 2015-09-16 HISTORY — DX: Mixed hyperlipidemia: E78.2

## 2015-09-16 HISTORY — DX: Primary osteoarthritis, unspecified ankle and foot: M19.079

## 2015-09-16 HISTORY — DX: Cystocele, unspecified: N81.10

## 2015-09-16 HISTORY — DX: Unspecified menopausal and perimenopausal disorder: N95.9

## 2015-09-16 HISTORY — DX: Hypothyroidism, unspecified: E03.9

## 2015-09-16 HISTORY — DX: Obesity, unspecified: E66.9

## 2015-09-16 HISTORY — DX: Neoplasm of unspecified behavior of bone, soft tissue, and skin: D49.2

## 2016-05-11 DIAGNOSIS — N3941 Urge incontinence: Secondary | ICD-10-CM | POA: Insufficient documentation

## 2016-07-27 ENCOUNTER — Other Ambulatory Visit: Payer: Self-pay | Admitting: Internal Medicine

## 2016-07-27 DIAGNOSIS — Z1231 Encounter for screening mammogram for malignant neoplasm of breast: Secondary | ICD-10-CM

## 2016-08-27 ENCOUNTER — Ambulatory Visit
Admission: RE | Admit: 2016-08-27 | Discharge: 2016-08-27 | Disposition: A | Discharge status: Home / Self Care | Payer: Medicare Other | Source: Ambulatory Visit | Attending: Internal Medicine | Admitting: Internal Medicine

## 2016-08-27 DIAGNOSIS — Z1231 Encounter for screening mammogram for malignant neoplasm of breast: Secondary | ICD-10-CM

## 2017-05-23 DIAGNOSIS — I471 Supraventricular tachycardia, unspecified: Secondary | ICD-10-CM | POA: Insufficient documentation

## 2017-05-23 DIAGNOSIS — I493 Ventricular premature depolarization: Secondary | ICD-10-CM

## 2017-05-23 HISTORY — DX: Supraventricular tachycardia: I47.1

## 2017-05-23 HISTORY — DX: Supraventricular tachycardia, unspecified: I47.10

## 2017-05-23 HISTORY — DX: Ventricular premature depolarization: I49.3

## 2017-06-12 DIAGNOSIS — R600 Localized edema: Secondary | ICD-10-CM

## 2017-06-12 DIAGNOSIS — M25872 Other specified joint disorders, left ankle and foot: Secondary | ICD-10-CM

## 2017-06-12 DIAGNOSIS — M79672 Pain in left foot: Secondary | ICD-10-CM

## 2017-06-12 HISTORY — DX: Localized edema: R60.0

## 2017-06-12 HISTORY — DX: Other specified joint disorders, left ankle and foot: M25.872

## 2017-06-12 HISTORY — DX: Pain in left foot: M79.672

## 2017-08-06 NOTE — Progress Notes (Signed)
Cardiology Office Note:    Date:  08/07/2017   ID:  Shannon Walker, DOB 01-Dec-1932, MRN [id number]  PCP:  Shannon Camel, NP  Cardiologist:  Shannon More, MD   Referring MD: Shannon Mina., MD  ASSESSMENT:    1. SVT (supraventricular tachycardia) ([place])   2. Essential hypertension    PLAN:    In order of problems listed above:  1. Having multiple episodes during the day and at night of brief racing of her heart.  During the day she takes a deep breath and coughs to break the episode at night she will need to get up and pace in her bedroom for 5-10 minutes before it stops.  She has documented SVT previously has been on a beta-blocker and I asked her to resume 1 and if not symptomatically improved [place] with an event monitor and consider an antiarrhythmic drug at the next visit.  At this time I do not think she requires anticoagulation and does not have a documented atrial fibrillation.  She will closely monitor heart rate blood pressure at home record and notify us if abnormal  2. Is poorly controlled not a target blood pressure runs 191-478 systolic at home and should respond to in addition of beta-blocker to her calcium channel blocker  Next appointment in 3 weeks   Medication Adjustments/Labs and Tests Ordered: Current medicines are reviewed at length with the patient today.  Concerns regarding medicines are outlined above.  Orders Placed This Encounter  Procedures  . EKG 12-Lead  . EKG 12-Lead   Meds ordered this encounter  Medications  . acebutolol (SECTRAL) 200 MG capsule    Sig: Take 1 capsule (200 mg total) by mouth 2 (two) times daily.    Dispense:  180 capsule    Refill:  3     Chief Complaint  Patient presents with  . New Patient (Initial Visit)    to evaluate fast heart rate    History of Present Illness:    Shannon Walker is a 84 y.o. female with a history of SVT, hypertension, hypothyroidism, and hyperlipdemia who is being seen today for  the evaluation of PSVT at the request of Shannon Mina., MD.  I had seen her in the past since December she is been having episodes of brief rapid heart rhythm has documented SVT and atrial tachycardia and wants to reenter my practice.  It has been greater than 3 years.  She notices that her heart will race during the day she will cough and it stops at night it is Walker severe last up to 5-10 minutes and improved spontaneously.  This is caused her to be exhausted but she has no exercise intolerance chest pain shortness of breath TIA or syncope.  She is taking no proarrhythmic drugs.  She took ACE butyl all in the past and was discontinued when she had symptomatic hypotension.  Presently her problem is hypertension and she remains out of control despite taking calcium channel blocker twice daily.  She has thyroid disease which is managed by her PCP Past Medical History:  Diagnosis Date  . Acquired hypothyroidism 09/16/2015   Last Assessment & Plan:  Relevant Hx: Course: Daily Update: Today's Plan:continue to follow her reviewed her last level for her  Electronically signed by: Shannon Camel, NP 11/16/15 1241  . Allergic rhinitis due to pollen 11/02/2011  . Arthritis, midfoot 09/16/2015  . Bilateral carotid artery stenosis 12/09/2012  . Calculus of kidney   . Chest  pain 12/09/2012  . Cystocele, lateral   . Cystocele, midline   . Cystocele, unspecified (CODE) 09/16/2015  . Disorder of bone and cartilage, unspecified   . Dyspnea and respiratory abnormality 12/09/2012  . Edema of left foot 06/12/2017  . Elevated blood pressure reading 12/09/2012  . Encounter for long-term (current) use of aspirin 12/09/2012  . Encounter for long-term (current) use of other medications 12/09/2012  . Headache 12/09/2012  . Hematuria, unspecified   . HTN (hypertension) 10/05/2013  . Hypertension   . Left foot pain 06/12/2017  . Localized osteoarthrosis not specified whether primary or secondary, unspecified site   .  Malaise and fatigue 12/09/2012   Last Assessment & Plan:  Relevant Hx: Course: Daily Update: Today's Plan:she is feeling her energy is returning for her of which she is really pleased with  Electronically signed by: Shannon Camel, NP 11/16/15 1247  . Menopausal disorder 09/16/2015   Last Assessment & Plan:  Relevant Hx: Course: Daily Update: Today's Plan:this is stable for her and discussed UTI and relationship with lack of estrogen her UA here today was clear for her which she was pleased with  Electronically signed by: Shannon Camel, NP 11/16/15 1246  . Mixed hyperlipidemia 09/16/2015   Last Assessment & Plan:  Relevant Hx: Course: Daily Update: Today's Plan:plan to see her lipids when she returns in September and update them then  Electronically signed by: Shannon Camel, NP 11/16/15 1246  . Morbid obesity ([place])   . Neoplasm of uncertain behavior of lip, oral cavity, 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  . Neoplasm of unspecified nature of bone, soft tissue, and skin   . Nonspecific abnormal cardiovascular system function study 12/09/2012  . Obesity with body mass index of 30.0-39.9 09/16/2015   Last Assessment & Plan:  Relevant Hx: Course: Daily Update: Today's Plan:she is doing the next 56 days and has lost 10 pounds of which she is very proud of as this is a lifestyle change for her and she is pleased with her efforts since she has never really been able to lose weight before and is hopeful she will be able to walk some with the weight loss  Electronically signed by: Welton Flakes  . Occlusion and stenosis of carotid artery 12/09/2012  . Occlusion and stenosis of multiple and bilateral precerebral arteries 12/09/2012  . Osteopenia 09/16/2015   Last Assessment & Plan:  Relevant Hx: Course: Daily Update: Today's Plan:reviewed with her the dexa scan last done advised her of need to take calcium and vitamin d  Electronically signed by: Shannon Camel, NP 09/19/15 2042    . Other abnormal glucose   . Other and unspecified hyperlipidemia   . Other malaise and fatigue   . Other specified pre-operative examination 12/09/2012  . Palpitations 10/05/2013  . Pre-syncope 10/05/2013  . Prediabetes 09/16/2015   Last Assessment & Plan:  Relevant Hx: Course: Daily Update: Today's Plan:her last level was stable for her at 5.9 and will continue to follow and hopefully will be lower for her with her weight loss  Electronically signed by: Shannon Camel, NP 11/16/15 1242  . Routine gynecological examination   . Shortness of breath 12/09/2012  . Subfibular impingement of left lower extremity 06/12/2017  . SVT (supraventricular tachycardia) ([place]) 05/23/2017  . Symptomatic PVCs 05/23/2017  . Symptoms involving cardiovascular system 12/09/2012  . Unspecified functional disorder of intestine   . Unspecified hypothyroidism   . Unspecified menopausal and postmenopausal disorder   . Urge incontinence   .  Vaginal enterocele, congenital or acquired   . Vitamin D deficiency     Past Surgical History:  Procedure Laterality Date  . CYSTOSCOPY    . REPLACEMENT TOTAL KNEE      Current Medications: Current Meds  Medication Sig  . amLODipine (NORVASC) 5 MG tablet Take 5 mg by mouth 2 (two) times daily.   Marland Kitchen aspirin EC 81 MG tablet Take 81 mg by mouth daily.  Marland Kitchen levothyroxine (SYNTHROID, LEVOTHROID) 50 MCG tablet TAKE 2 TABLETS BY MOUTH  MONDAY AND FRIDAY AND TAKE  1 TABLET ON ALL OTHER DAYS  . Magnesium Gluconate 550 MG TABS Take 1 capsule by mouth daily.  . Vitamin D, Cholecalciferol, 1000 units CAPS Take 2,000 Units by mouth daily.      Allergies:   Bee venom; Celecoxib; Iodine; Sulfamethoxazole-trimethoprim; Nabumetone; and Other   Social History   Socioeconomic History  . Marital status: Married    Spouse name: None  . Number of children: None  . Years of education: None  . Highest education level: None  Social Needs  . Financial resource strain: None  .  Food insecurity - worry: None  . Food insecurity - inability: None  . Transportation needs - medical: None  . Transportation needs - non-medical: None  Occupational History  . None  Tobacco Use  . Smoking status: Never Smoker  . Smokeless tobacco: Never Used  Substance and Sexual Activity  . Alcohol use: No  . Drug use: No  . Sexual activity: None  Other Topics Concern  . None  Social History Narrative  . None     Family History: The patient's family history includes Clotting disorder in her father; Diabetes in her brother and sister; Heart Problems in her sister; Heart attack in her mother.  ROS:   Review of Systems  Constitution: Negative.  HENT: Negative.   Eyes: Negative.   Cardiovascular: Positive for palpitations.  Respiratory: Negative.   Endocrine: Negative.   Hematologic/Lymphatic: Negative.   Skin: Negative.   Musculoskeletal: Negative.   Gastrointestinal: Positive for abdominal pain (kidney stone).  Genitourinary: Negative.   Neurological: Negative.   Psychiatric/Behavioral: Negative.   Allergic/Immunologic: Negative.    Please see the history of present illness.     All other systems reviewed and are negative.  EKGs/Labs/Other Studies Reviewed:    The following studies were reviewed today:  Holter monitor demonstrated the following: Occasional PVCs, occasional short runs of PAT longest run 14 beats. One occasion she felt palpitations which are correlated with a PVC. No clear symptom noted with the run of SVT. It appeared to be atrial tachycardia rate of approximately 140. There was no abnormal bradycardia and no ventricular tachycardia     EKG:  EKG is  ordered today.  The ekg ordered today demonstrates Sinus tachycardia 103 BPM, LAD  Recent Labs: No results found for requested labs within last 8760 hours.  Recent Lipid Panel No results found for: CHOL, TRIG, HDL, CHOLHDL, VLDL, LDLCALC, LDLDIRECT  Physical Exam:    VS:  BP (!) 146/94 (BP  Location: Right Arm, Patient Position: Sitting, Cuff Size: Large)   Pulse (!) 103   Ht 5\' 3"  (1.6 m)   Wt 214 lb 12.8 oz (97.4 kg)   SpO2 97%   BMI 38.05 kg/m     Wt Readings from Last 3 Encounters:  08/07/17 214 lb 12.8 oz (97.4 kg)  06/02/14 200 lb (90.7 kg)  10/05/13 214 lb (97.1 kg)     GEN:  Well  nourished, well developed in no acute distress HEENT: Normal NECK: No JVD; No carotid bruits LYMPHATICS: No lymphadenopathy CARDIAC:  RRR, no murmurs, rubs, gallops RESPIRATORY:  Clear to auscultation without rales, wheezing or rhonchi  ABDOMEN: Soft, non-tender, non-distended MUSCULOSKELETAL:  No edema; No deformity  SKIN: Warm and dry NEUROLOGIC:  Alert and oriented x 3 PSYCHIATRIC:  Normal affect     Signed, Shannon More, MD  08/07/2017 4:38 PM    [facility]

## 2017-08-07 ENCOUNTER — Ambulatory Visit: Payer: Medicare Other | Admitting: Cardiology

## 2017-08-07 ENCOUNTER — Encounter: Payer: Self-pay | Admitting: Cardiology

## 2017-08-07 VITALS — BP 146/94 | HR 103 | Ht 63.0 in | Wt 214.8 lb

## 2017-08-07 DIAGNOSIS — I10 Essential (primary) hypertension: Secondary | ICD-10-CM | POA: Diagnosis not present

## 2017-08-07 DIAGNOSIS — I471 Supraventricular tachycardia: Secondary | ICD-10-CM

## 2017-08-07 MED ORDER — ACEBUTOLOL HCL 200 MG PO CAPS: 200.0000 mg | ORAL_CAPSULE | Freq: Two times a day (BID) | ORAL | 3 refills | Status: DC

## 2017-08-07 NOTE — Patient Instructions (Signed)
Medication Instructions:  Your physician has recommended you make the following change in your medication: START acebutolol 20 mg twice daily  Labwork: None  Testing/Procedures: You had an EKG today.  Go to your PCP's office in 1 week for an EKG.   Follow-Up: Your physician recommends that you schedule a follow-up appointment in: 3 weeks.  Any Other Special Instructions Will Be Listed Below (If Applicable).     If you need a refill on your cardiac medications before your next appointment, please call your pharmacy.

## 2017-08-16 ENCOUNTER — Telehealth: Payer: Self-pay | Admitting: Cardiology

## 2017-08-16 MED ORDER — AMLODIPINE BESYLATE 5 MG PO TABS: 5.0000 mg | ORAL_TABLET | Freq: Two times a day (BID) | ORAL | 3 refills | Status: DC

## 2017-08-16 NOTE — Telephone Encounter (Signed)
Spoke with Dr. Bettina Gavia, advised for patient to stop taking acebutolol at this time. Patient verbalized understanding. No further questions.

## 2017-08-16 NOTE — Telephone Encounter (Signed)
Patient states taking the acebutolol makes her heart skip beats

## 2017-08-19 ENCOUNTER — Telehealth: Payer: Self-pay | Admitting: Cardiology

## 2017-08-19 DIAGNOSIS — I471 Supraventricular tachycardia, unspecified: Secondary | ICD-10-CM

## 2017-08-19 NOTE — Addendum Note (Signed)
Addended by: Warner Mccreedy E on: 08/19/2017 10:36 AM   Modules accepted: Orders

## 2017-08-19 NOTE — Telephone Encounter (Signed)
Patient advised to come to office for an EKG. Patient scheduled for tomorrow 08/20/17 at 11 am. Patient advised that Dr. Bettina Gavia wants her to wear a 2 week event monitor. Patient verbalized understanding. Patient scheduled for event monitor tomorrow 08/20/17 at 11:30 am. No further questions.

## 2017-08-19 NOTE — Telephone Encounter (Signed)
Heart rate has been 111 since 3oclock this morning

## 2017-08-19 NOTE — Telephone Encounter (Signed)
1. office EKG 2 week event monitor

## 2017-08-19 NOTE — Telephone Encounter (Signed)
Patient's heart rate is 110 beats per minute, she is very nervous. Patient went to the ED at [facility] last weekend and the doctor told her she was sure it was afib, but by the time she got there it had quit. This doctor recommended a monitor. Patient's blood pressure goes up when her heart rate goes up. BP 150/100 at 3 am this morning. No chest pain or shortness of breath. Please advise.

## 2017-08-20 ENCOUNTER — Ambulatory Visit: Payer: Medicare Other

## 2017-08-20 ENCOUNTER — Ambulatory Visit (INDEPENDENT_AMBULATORY_CARE_PROVIDER_SITE_OTHER): Payer: Medicare Other | Admitting: Cardiology

## 2017-08-20 VITALS — BP 142/98 | HR 116

## 2017-08-20 DIAGNOSIS — I471 Supraventricular tachycardia: Secondary | ICD-10-CM

## 2017-08-20 NOTE — Progress Notes (Signed)
Patient presented today for EKG due to tachy rhythm. Patient reported feeling fatigued, short of breath, and stated her "heart was racing." EKG showed sinus tachycardia per Dr. Bettina Gavia, no changes to be made at the moment. Continue with the plan for monitor being placed today. Patient stated that she could not tolerate the beta blocker as it caused her to feel as though she was having palpitations and skipped beats. Patient was agreeable to the plan.

## 2017-08-21 DIAGNOSIS — F419 Anxiety disorder, unspecified: Secondary | ICD-10-CM | POA: Insufficient documentation

## 2017-08-21 HISTORY — DX: Anxiety disorder, unspecified: F41.9

## 2017-08-23 DIAGNOSIS — K219 Gastro-esophageal reflux disease without esophagitis: Secondary | ICD-10-CM

## 2017-08-23 HISTORY — DX: Gastro-esophageal reflux disease without esophagitis: K21.9

## 2017-08-26 ENCOUNTER — Other Ambulatory Visit: Payer: Self-pay | Admitting: Internal Medicine

## 2017-08-26 DIAGNOSIS — Z1231 Encounter for screening mammogram for malignant neoplasm of breast: Secondary | ICD-10-CM

## 2017-08-28 ENCOUNTER — Ambulatory Visit: Payer: Medicare Other | Admitting: Cardiology

## 2017-09-02 DIAGNOSIS — I471 Supraventricular tachycardia: Secondary | ICD-10-CM

## 2017-09-09 NOTE — Progress Notes (Signed)
Cardiology Office Note:    Date:  09/10/2017   ID:  Shannon Walker, DOB 12-Nov-1932, MRN [id number]  PCP:  Mayer Camel, NP  Cardiologist:  Shirlee More, MD    Referring MD: Bess Harvest*    ASSESSMENT:    1. SVT (supraventricular tachycardia) ([place])   2. Essential hypertension   3. Symptomatic PVCs    PLAN:    In order of problems listed above:  1. For arrhythmia and SVT and symptomatic PVCs appears stable and her clinical problem appears to be symptomatic sinus tachycardia without an obvious precipitant.  The rates are too slow for ectopic atrial tachycardia or inappropriate tachycardia I see unable to reassure her and I placed her on a different selective beta-blocker to mitigate symptoms.  There is no obvious precipitant I have discussed with the patient and her daughter and she is compliant with medications we will recheck her TSH and T4 but I doubt that she is thyrotoxic.  Stable blood pressure 2. Continue current treatment 4 weeks   Next appointment: 4 weeks   Medication Adjustments/Labs and Tests Ordered: Current medicines are reviewed at length with the patient today.  Concerns regarding medicines are outlined above.  Orders Placed This Encounter  Procedures  . TSH + free T4   Meds ordered this encounter  Medications  . metoprolol succinate (TOPROL-XL) 50 MG 24 hr tablet    Sig: Take 1 tablet (50 mg total) by mouth daily. Take with or immediately following a meal.    Dispense:  30 tablet    Refill:  11    Chief Complaint  Patient presents with  . Follow-up    SVT PVC's  . Palpitations    History of Present Illness:    Shannon Walker is a 84 y.o. female with a hx of SVT, hypertension, hypothyroidism, and hyperlipdemia last seen 08/07/17. She was seen in the ED 08/10/17 for palpitation and weakness found to be in sinus rhythm. ASSESSMENT:    1. SVT (supraventricular tachycardia) ([place])   2. Essential hypertension    PLAN:      In order of problems listed above:  1. Having multiple episodes during the day and at night of brief racing of her heart.  During the day she takes a deep breath and coughs to break the episode at night she will need to get up and pace in her bedroom for 5-10 minutes before it stops.  She has documented SVT previously has been on a beta-blocker and I asked her to resume 1 and if not symptomatically improved we will use   an event monitor and consider an antiarrhythmic drug at the next visit.  At this time I do not think she requires anticoagulation and does not have a documented atrial fibrillation.  She will closely monitor heart rate blood pressure at home record and notify us if abnormal  2. Is poorly controlled not a target blood pressure runs [id number] systolic at home and should respond to in addition of beta-blocker to her calcium channel blocker  Compliance with diet, lifestyle and medications: Yes She is seen along with her daughter.  She believes that a beta-blocker worsened her palpitation.  When I asked her about her level of symptoms I get 3 different answers the first is that she is better the second is she is unchanged and the third is that she is worsened.  She now is having a problem at nighttime of her heart racing rates of 110-120 the last  for 30 minutes to an hour and spontaneously resolved.  She wore an event monitor these episodes are sinus tachycardia and she has no frequent or complex arrhythmia.  There is no obvious precipitant her thyroid hormone is unchanged she takes no over-the-counter proarrhythmic drugs.  Potentially if she is sensitive as her beta-blocker was stopped have convinced her to go back on a different selective beta-blocker Toprol and if symptoms persist may require repeat event monitoring.  For completeness I will recheck her TSH and T4 free today.  She was placed on Celexa for anxiety and has had no improvement. Past Medical History:  Diagnosis Date  .  Acquired hypothyroidism 09/16/2015   Last Assessment & Plan:  Relevant Hx: Course: Daily Update: Today's Plan:continue to follow her reviewed her last level for her  Electronically signed by: Mayer Camel, NP 11/16/15 1241  . Allergic rhinitis due to pollen 11/02/2011  . Arthritis, midfoot 09/16/2015  . Bilateral carotid artery stenosis 12/09/2012  . Calculus of kidney   . Chest pain 12/09/2012  . Cystocele, lateral   . Cystocele, midline   . Cystocele, unspecified (CODE) 09/16/2015  . Disorder of bone and cartilage, unspecified   . Dyspnea and respiratory abnormality 12/09/2012  . Edema of left foot 06/12/2017  . Elevated blood pressure reading 12/09/2012  . Encounter for long-term (current) use of aspirin 12/09/2012  . Encounter for long-term (current) use of other medications 12/09/2012  . Headache 12/09/2012  . Hematuria, unspecified   . HTN (hypertension) 10/05/2013  . Hypertension   . Left foot pain 06/12/2017  . Localized osteoarthrosis not specified whether primary or secondary, unspecified site   . Malaise and fatigue 12/09/2012   Last Assessment & Plan:  Relevant Hx: Course: Daily Update: Today's Plan:she is feeling her energy is returning for her of which she is really pleased with  Electronically signed by: Mayer Camel, NP 11/16/15 1247  . Menopausal disorder 09/16/2015   Last Assessment & Plan:  Relevant Hx: Course: Daily Update: Today's Plan:this is stable for her and discussed UTI and relationship with lack of estrogen her UA here today was clear for her which she was pleased with  Electronically signed by: Mayer Camel, NP 11/16/15 1246  . Mixed hyperlipidemia 09/16/2015   Last Assessment & Plan:  Relevant Hx: Course: Daily Update: Today's Plan:plan to see her lipids when she returns in September and update them then  Electronically signed by: Mayer Camel, NP 11/16/15 1246  . Morbid obesity ([place])   . Neoplasm of uncertain behavior  of lip, oral cavity, 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  . Neoplasm of unspecified nature of bone, soft tissue, and skin   . Nonspecific abnormal cardiovascular system function study 12/09/2012  . Obesity with body mass index of 30.0-39.9 09/16/2015   Last Assessment & Plan:  Relevant Hx: Course: Daily Update: Today's Plan:she is doing the next 56 days and has lost 10 pounds of which she is very proud of as this is a lifestyle change for her and she is pleased with her efforts since she has never really been able to lose weight before and is hopeful she will be able to walk some with the weight loss  Electronically signed by: Welton Flakes  . Occlusion and stenosis of carotid artery 12/09/2012  . Occlusion and stenosis of multiple and bilateral precerebral arteries 12/09/2012  . Osteopenia 09/16/2015   Last Assessment & Plan:  Relevant Hx: Course: Daily Update: Today's Plan:reviewed with her the dexa scan last  done advised her of need to take calcium and vitamin d  Electronically signed by: Mayer Camel, NP 09/19/15 2042  . Other abnormal glucose   . Other and unspecified hyperlipidemia   . Other malaise and fatigue   . Other specified pre-operative examination 12/09/2012  . Palpitations 10/05/2013  . Pre-syncope 10/05/2013  . Prediabetes 09/16/2015   Last Assessment & Plan:  Relevant Hx: Course: Daily Update: Today's Plan:her last level was stable for her at 5.9 and will continue to follow and hopefully will be lower for her with her weight loss  Electronically signed by: Mayer Camel, NP 11/16/15 1242  . Routine gynecological examination   . Shortness of breath 12/09/2012  . Subfibular impingement of left lower extremity 06/12/2017  . SVT (supraventricular tachycardia) ([place]) 05/23/2017  . Symptomatic PVCs 05/23/2017  . Symptoms involving cardiovascular system 12/09/2012  . Unspecified functional disorder of intestine   . Unspecified hypothyroidism   . Unspecified menopausal and  postmenopausal disorder   . Urge incontinence   . Vaginal enterocele, congenital or acquired   . Vitamin D deficiency     Past Surgical History:  Procedure Laterality Date  . CYSTOSCOPY    . REPLACEMENT TOTAL KNEE      Current Medications: Current Meds  Medication Sig  . amLODipine (NORVASC) 5 MG tablet Take 1 tablet (5 mg total) by mouth 2 (two) times daily.  Marland Kitchen aspirin EC 81 MG tablet Take 81 mg by mouth daily.  . citalopram (CELEXA) 10 MG tablet Take 10 mg by mouth daily.  Marland Kitchen levothyroxine (SYNTHROID, LEVOTHROID) 50 MCG tablet TAKE 2 TABLETS BY MOUTH  MONDAY AND FRIDAY AND TAKE  1 TABLET ON ALL OTHER DAYS  . Magnesium Gluconate 550 MG TABS Take 1 capsule by mouth daily.  . Vitamin D, Cholecalciferol, 1000 units CAPS Take 2,000 Units by mouth daily.      Allergies:   Bee venom; Celecoxib; Iodine; Sulfamethoxazole-trimethoprim; Nabumetone; and Other   Social History   Socioeconomic History  . Marital status: Married    Spouse name: Not on file  . Number of children: Not on file  . Years of education: Not on file  . Highest education level: Not on file  Occupational History  . Not on file  Social Needs  . Financial resource strain: Not on file  . Food insecurity:    Worry: Not on file    Inability: Not on file  . Transportation needs:    Medical: Not on file    Non-medical: Not on file  Tobacco Use  . Smoking status: Never Smoker  . Smokeless tobacco: Never Used  Substance and Sexual Activity  . Alcohol use: No  . Drug use: No  . Sexual activity: Not on file  Lifestyle  . Physical activity:    Days per week: Not on file    Minutes per session: Not on file  . Stress: Not on file  Relationships  . Social connections:    Talks on phone: Not on file    Gets together: Not on file    Attends religious service: Not on file    Active member of club or organization: Not on file    Attends meetings of clubs or organizations: Not on file    Relationship status: Not on  file  Other Topics Concern  . Not on file  Social History Narrative  . Not on file     Family History: The patient's family history includes Clotting disorder in her  father; Diabetes in her brother and sister; Heart Problems in her sister; Heart attack in her mother. ROS:   Please see the history of present illness.    All other systems reviewed and are negative.  EKGs/Labs/Other Studies Reviewed:    The following studies were reviewed today:   Holter monitor: Narrative & Impression    Dates:                                    08/20/2017 2 09/02/2017 Indication:                              SVT Ordering;                               Dr. Bettina Gavia Interpreting:                           Dr. Bettina Gavia Baseline transmission:             Sinus tachycardia 122 bpm Atrial arrhythmia:                    One event with atrial premature contractions Ventricular arrhythmia:            5 events with ventricular premature contractions Conduction abnormality:          None Bradycardia:                           None Symptoms:                             No symptomatic events Conclusion:                             Atrial and ventricular premature contractions asymptomatic   Recent Labs: recent CBC and TSH normal No results found for requested labs within last 8760 hours.  Recent Lipid Panel No results found for: CHOL, TRIG, HDL, CHOLHDL, VLDL, LDLCALC, LDLDIRECT  Physical Exam:    VS:  BP 136/88 (BP Location: Right Arm, Patient Position: Sitting, Cuff Size: Large)   Pulse 99   Ht 5\' 3"  (1.6 m)   Wt 216 lb (98 kg)   SpO2 98%   BMI 38.26 kg/m     Wt Readings from Last 3 Encounters:  09/10/17 216 lb (98 kg)  08/07/17 214 lb 12.8 oz (97.4 kg)  06/02/14 200 lb (90.7 kg)     GEN:  Well nourished, well developed in no acute distress HEENT: Normal NECK: No JVD; No carotid bruits LYMPHATICS: No lymphadenopathy CARDIAC: RRR, no murmurs, rubs, gallops RESPIRATORY:  Clear to auscultation  without rales, wheezing or rhonchi  ABDOMEN: Soft, non-tender, non-distended MUSCULOSKELETAL:  No edema; No deformity  SKIN: Warm and dry NEUROLOGIC:  Alert and oriented x 3 PSYCHIATRIC:  Normal affect    Signed, Shirlee More, MD  09/10/2017 11:55 AM    [place]

## 2017-09-10 ENCOUNTER — Ambulatory Visit: Payer: Medicare Other | Admitting: Cardiology

## 2017-09-10 ENCOUNTER — Encounter: Payer: Self-pay | Admitting: Cardiology

## 2017-09-10 VITALS — BP 136/88 | HR 99 | Ht 63.0 in | Wt 216.0 lb

## 2017-09-10 DIAGNOSIS — I10 Essential (primary) hypertension: Secondary | ICD-10-CM

## 2017-09-10 DIAGNOSIS — I493 Ventricular premature depolarization: Secondary | ICD-10-CM | POA: Diagnosis not present

## 2017-09-10 DIAGNOSIS — I471 Supraventricular tachycardia: Secondary | ICD-10-CM

## 2017-09-10 MED ORDER — METOPROLOL SUCCINATE ER 50 MG PO TB24: 50.0000 mg | ORAL_TABLET | Freq: Every day | ORAL | 11 refills | Status: DC

## 2017-09-10 NOTE — Patient Instructions (Addendum)
Medication Instructions:  Your physician has recommended you make the following change in your medication:  START metoprolol (Toprol XL) 50 mg daily  Labwork: Your physician recommends that you have the following labs drawn: TSH, T4  Testing/Procedures: None  Follow-Up: Your physician recommends that you schedule a follow-up appointment in: 4 weeks.  Any Other Special Instructions Will Be Listed Below (If Applicable).     If you need a refill on your cardiac medications before your next appointment, please call your pharmacy.    1. Avoid all over-the-counter antihistamines except Claritin/Loratadine and Zyrtec/Cetrizine. 2. Avoid all combination including cold sinus allergies flu decongestant and sleep medications 3. You can use Robitussin DM Mucinex and Mucinex DM for cough. 4. can use Tylenol aspirin ibuprofen and naproxen but no combinations such as sleep or sinus.

## 2017-09-11 LAB — TSH+FREE T4
Free T4: 1.27 ng/dL (ref 0.82–1.77)
TSH: 2.67 u[IU]/mL (ref 0.450–4.500)

## 2017-09-13 ENCOUNTER — Ambulatory Visit
Admission: RE | Admit: 2017-09-13 | Discharge: 2017-09-13 | Disposition: A | Discharge status: Home / Self Care | Payer: Medicare Other | Source: Ambulatory Visit | Attending: Internal Medicine | Admitting: Internal Medicine

## 2017-09-13 DIAGNOSIS — Z1231 Encounter for screening mammogram for malignant neoplasm of breast: Secondary | ICD-10-CM

## 2017-10-10 ENCOUNTER — Ambulatory Visit: Payer: Medicare Other | Admitting: Cardiology

## 2017-12-04 ENCOUNTER — Other Ambulatory Visit: Payer: Self-pay

## 2017-12-04 ENCOUNTER — Telehealth: Payer: Self-pay | Admitting: Cardiology

## 2017-12-04 DIAGNOSIS — I471 Supraventricular tachycardia: Secondary | ICD-10-CM

## 2017-12-04 NOTE — Telephone Encounter (Signed)
Frequent, lets do a 48 hr holter for a quick answer

## 2017-12-04 NOTE — Telephone Encounter (Signed)
Spoke with patient regarding a 48 hour holter monitor, she was agreeable. Order has been placed will have scheduled. Thank you.

## 2017-12-04 NOTE — Telephone Encounter (Signed)
Patient states that she is having irregular hearrtrate at 120 and states she has to have some hlep she cant go on like this!!  mostyl happens at night. Please call her.

## 2017-12-04 NOTE — Telephone Encounter (Signed)
Patient states that as soon as she lays down to rest her heart rate begins to race and she is unable to rest due to this. Patient states that the only symptoms she is experiencing is she begins to feel hot. You had stated in your last note that you may consider an event monitor if her symptoms continue, not sure if you wanted to do longer than the two week monitor she had previously. Please advise?

## 2017-12-06 DIAGNOSIS — F330 Major depressive disorder, recurrent, mild: Secondary | ICD-10-CM

## 2017-12-06 HISTORY — DX: Major depressive disorder, recurrent, mild: F33.0

## 2018-01-01 ENCOUNTER — Ambulatory Visit (INDEPENDENT_AMBULATORY_CARE_PROVIDER_SITE_OTHER): Payer: Medicare Other

## 2018-01-01 DIAGNOSIS — I471 Supraventricular tachycardia: Secondary | ICD-10-CM | POA: Diagnosis not present

## 2018-01-31 ENCOUNTER — Ambulatory Visit (INDEPENDENT_AMBULATORY_CARE_PROVIDER_SITE_OTHER): Payer: Medicare Other | Admitting: Cardiology

## 2018-01-31 VITALS — BP 134/84 | HR 94 | Ht 63.0 in | Wt 214.4 lb

## 2018-01-31 DIAGNOSIS — I10 Essential (primary) hypertension: Secondary | ICD-10-CM

## 2018-01-31 DIAGNOSIS — I493 Ventricular premature depolarization: Secondary | ICD-10-CM

## 2018-01-31 DIAGNOSIS — I471 Supraventricular tachycardia: Secondary | ICD-10-CM | POA: Diagnosis not present

## 2018-01-31 MED ORDER — AMLODIPINE BESYLATE 5 MG PO TABS: 5.0000 mg | ORAL_TABLET | Freq: Two times a day (BID) | ORAL | 2 refills | Status: DC

## 2018-01-31 NOTE — Progress Notes (Signed)
Cardiology Office Note:    Date:  01/31/2018   ID:  Shannon Walker, DOB April 01, 1933, MRN [id number]  PCP:  Mayer Camel, NP  Cardiologist:  Shirlee More, MD    Referring MD: Bess Harvest*    ASSESSMENT:    1. SVT (supraventricular tachycardia) ([place])   2. Symptomatic PVCs   3. Essential hypertension    PLAN:    In order of problems listed above:  1. Stable she will continue beta-blocker 2. Improved stable continue beta-blocker 3. BP at target continue current treatment beta-blocker calcium channel blocker every 8 her prescription for Norvasc   Next appointment: One year   Medication Adjustments/Labs and Tests Ordered: Current medicines are reviewed at length with the patient today.  Concerns regarding medicines are outlined above.  No orders of the defined types were placed in this encounter.  No orders of the defined types were placed in this encounter.   No chief complaint on file.   History of Present Illness:    Shannon Walker is a 84 y.o. female with a hx of SVT, PVC's, hypertension, hypothyroidism, and hyperlipdemia  last seen 09/10/17. Compliance with diet, lifestyle and medications: Yes  Palpitations improved not frequent or severe and no sustained rapid heart rhythm.  She feels fatigued and tells me that she thinks that stress and anxiety play a role where she had someone to speak to encourage her to see her family physician for evaluation.  No chest pain syncope TIA Past Medical History:  Diagnosis Date  . Acquired hypothyroidism 09/16/2015   Last Assessment & Plan:  Relevant Hx: Course: Daily Update: Today's Plan:continue to follow her reviewed her last level for her  Electronically signed by: Mayer Camel, NP 11/16/15 1241  . Allergic rhinitis due to pollen 11/02/2011  . Arthritis, midfoot 09/16/2015  . Bilateral carotid artery stenosis 12/09/2012  . Calculus of kidney   . Chest pain 12/09/2012  . Cystocele, lateral    . Cystocele, midline   . Cystocele, unspecified (CODE) 09/16/2015  . Disorder of bone and cartilage, unspecified   . Dyspnea and respiratory abnormality 12/09/2012  . Edema of left foot 06/12/2017  . Elevated blood pressure reading 12/09/2012  . Encounter for long-term (current) use of aspirin 12/09/2012  . Encounter for long-term (current) use of other medications 12/09/2012  . Headache 12/09/2012  . Hematuria, unspecified   . HTN (hypertension) 10/05/2013  . Hypertension   . Left foot pain 06/12/2017  . Localized osteoarthrosis not specified whether primary or secondary, unspecified site   . Malaise and fatigue 12/09/2012   Last Assessment & Plan:  Relevant Hx: Course: Daily Update: Today's Plan:she is feeling her energy is returning for her of which she is really pleased with  Electronically signed by: Mayer Camel, NP 11/16/15 1247  . Menopausal disorder 09/16/2015   Last Assessment & Plan:  Relevant Hx: Course: Daily Update: Today's Plan:this is stable for her and discussed UTI and relationship with lack of estrogen her UA here today was clear for her which she was pleased with  Electronically signed by: Mayer Camel, NP 11/16/15 1246  . Mixed hyperlipidemia 09/16/2015   Last Assessment & Plan:  Relevant Hx: Course: Daily Update: Today's Plan:plan to see her lipids when she returns in September and update them then  Electronically signed by: Mayer Camel, NP 11/16/15 1246  . Morbid obesity ([place])   . Neoplasm of uncertain behavior of lip, oral cavity, 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  . Neoplasm  of unspecified nature of bone, soft tissue, and skin   . Nonspecific abnormal cardiovascular system function study 12/09/2012  . Obesity with body mass index of 30.0-39.9 09/16/2015   Last Assessment & Plan:  Relevant Hx: Course: Daily Update: Today's Plan:she is doing the next 56 days and has lost 10 pounds of which she is very proud of as this is a lifestyle change for her and  she is pleased with her efforts since she has never really been able to lose weight before and is hopeful she will be able to walk some with the weight loss  Electronically signed by: Welton Flakes  . Occlusion and stenosis of carotid artery 12/09/2012  . Occlusion and stenosis of multiple and bilateral precerebral arteries 12/09/2012  . Osteopenia 09/16/2015   Last Assessment & Plan:  Relevant Hx: Course: Daily Update: Today's Plan:reviewed with her the dexa scan last done advised her of need to take calcium and vitamin d  Electronically signed by: Mayer Camel, NP 09/19/15 2042  . Other abnormal glucose   . Other and unspecified hyperlipidemia   . Other malaise and fatigue   . Other specified pre-operative examination 12/09/2012  . Palpitations 10/05/2013  . Pre-syncope 10/05/2013  . Prediabetes 09/16/2015   Last Assessment & Plan:  Relevant Hx: Course: Daily Update: Today's Plan:her last level was stable for her at 5.9 and will continue to follow and hopefully will be lower for her with her weight loss  Electronically signed by: Mayer Camel, NP 11/16/15 1242  . Routine gynecological examination   . Shortness of breath 12/09/2012  . Subfibular impingement of left lower extremity 06/12/2017  . SVT (supraventricular tachycardia) ([place]) 05/23/2017  . Symptomatic PVCs 05/23/2017  . Symptoms involving cardiovascular system 12/09/2012  . Unspecified functional disorder of intestine   . Unspecified hypothyroidism   . Unspecified menopausal and postmenopausal disorder   . Urge incontinence   . Vaginal enterocele, congenital or acquired   . Vitamin D deficiency     Past Surgical History:  Procedure Laterality Date  . CYSTOSCOPY    . REPLACEMENT TOTAL KNEE      Current Medications: Current Meds  Medication Sig  . ALPRAZolam (XANAX) 0.5 MG tablet TAKE 1/2 TO 1 TABLET BY MOUTH EVERY 8 HOURS AS NEEDED FOR RESCUE/ANXIETY   Refills after prescription expiration require  follow-up evaluation at the office.  Marland Kitchen amLODipine (NORVASC) 5 MG tablet Take 1 tablet (5 mg total) by mouth 2 (two) times daily.  Marland Kitchen aspirin EC 81 MG tablet Take 81 mg by mouth daily.  . citalopram (CELEXA) 10 MG tablet Take 10 mg by mouth daily.  Marland Kitchen levothyroxine (SYNTHROID, LEVOTHROID) 50 MCG tablet TAKE 2 TABLETS BY MOUTH  MONDAY AND FRIDAY AND TAKE  1 TABLET ON ALL OTHER DAYS  . Magnesium Gluconate 550 MG TABS Take 1 capsule by mouth daily.  . metoprolol succinate (TOPROL-XL) 50 MG 24 hr tablet Take 1 tablet (50 mg total) by mouth daily. Take with or immediately following a meal.  . Vitamin D, Cholecalciferol, 1000 units CAPS Take 2,000 Units by mouth daily.      Allergies:   Bee venom; Celecoxib; Iodine; Sulfamethoxazole-trimethoprim; Iodinated diagnostic agents; and Nabumetone   Social History   Socioeconomic History  . Marital status: Married    Spouse name: Not on file  . Number of children: Not on file  . Years of education: Not on file  . Highest education level: Not on file  Occupational History  .  Not on file  Social Needs  . Financial resource strain: Not on file  . Food insecurity:    Worry: Not on file    Inability: Not on file  . Transportation needs:    Medical: Not on file    Non-medical: Not on file  Tobacco Use  . Smoking status: Never Smoker  . Smokeless tobacco: Never Used  Substance and Sexual Activity  . Alcohol use: No  . Drug use: No  . Sexual activity: Not on file  Lifestyle  . Physical activity:    Days per week: Not on file    Minutes per session: Not on file  . Stress: Not on file  Relationships  . Social connections:    Talks on phone: Not on file    Gets together: Not on file    Attends religious service: Not on file    Active member of club or organization: Not on file    Attends meetings of clubs or organizations: Not on file    Relationship status: Not on file  Other Topics Concern  . Not on file  Social History Narrative  . Not on  file     Family History: The patient's family history includes Clotting disorder in her father; Diabetes in her brother and sister; Heart Problems in her sister; Heart attack in her mother. ROS:   Please see the history of present illness.    All other systems reviewed and are negative.  EKGs/Labs/Other Studies Reviewed:    The following studies were reviewed today  Recent Labs: 09/10/2017: TSH 2.670  Recent Lipid Panel No results found for: CHOL, TRIG, HDL, CHOLHDL, VLDL, LDLCALC, LDLDIRECT  Physical Exam:    VS:  BP 134/84 (BP Location: Right Arm, Patient Position: Sitting, Cuff Size: Large)   Pulse 94   Ht 5\' 3"  (1.6 m)   Wt 214 lb 6.4 oz (97.3 kg)   SpO2 98%   BMI 37.98 kg/m     Wt Readings from Last 3 Encounters:  01/31/18 214 lb 6.4 oz (97.3 kg)  09/10/17 216 lb (98 kg)  08/07/17 214 lb 12.8 oz (97.4 kg)     GEN:  Well nourished, well developed in no acute distress HEENT: Normal NECK: No JVD; No carotid bruits LYMPHATICS: No lymphadenopathy CARDIAC: RRR, no murmurs, rubs, gallops RESPIRATORY:  Clear to auscultation without rales, wheezing or rhonchi  ABDOMEN: Soft, non-tender, non-distended MUSCULOSKELETAL:  No edema; No deformity  SKIN: Warm and dry NEUROLOGIC:  Alert and oriented x 3 PSYCHIATRIC:  Normal affect    Signed, Shirlee More, MD  01/31/2018 8:50 AM    [place]

## 2018-01-31 NOTE — Addendum Note (Signed)
Addended by: Austin Miles on: 01/31/2018 09:15 AM   Modules accepted: Orders

## 2018-01-31 NOTE — Patient Instructions (Signed)
Medication Instructions:  Your physician recommends that you continue on your current medications as directed. Please refer to the Current Medication list given to you today.   Labwork: None  Testing/Procedures: None  Follow-Up: Your physician wants you to follow-up in: 1 year. You will receive a reminder letter in the mail two months in advance. If you don't receive a letter, please call our office to schedule the follow-up appointment.   If you need a refill on your cardiac medications before your next appointment, please call your pharmacy.   Thank you for choosing [facility]! Robyne Peers, RN [phone]

## 2019-01-28 ENCOUNTER — Other Ambulatory Visit: Payer: Self-pay | Admitting: Internal Medicine

## 2019-01-28 DIAGNOSIS — Z1231 Encounter for screening mammogram for malignant neoplasm of breast: Secondary | ICD-10-CM

## 2019-03-17 ENCOUNTER — Other Ambulatory Visit: Payer: Self-pay

## 2019-03-17 ENCOUNTER — Ambulatory Visit
Admission: RE | Admit: 2019-03-17 | Discharge: 2019-03-17 | Disposition: A | Discharge status: Home / Self Care | Payer: Medicare Other | Source: Ambulatory Visit | Attending: Internal Medicine | Admitting: Internal Medicine

## 2019-03-17 DIAGNOSIS — Z1231 Encounter for screening mammogram for malignant neoplasm of breast: Secondary | ICD-10-CM

## 2019-05-07 DIAGNOSIS — M79641 Pain in right hand: Secondary | ICD-10-CM

## 2019-05-07 HISTORY — DX: Pain in right hand: M79.641

## 2019-06-22 DIAGNOSIS — G5601 Carpal tunnel syndrome, right upper limb: Secondary | ICD-10-CM

## 2019-06-22 HISTORY — DX: Carpal tunnel syndrome, right upper limb: G56.01

## 2019-10-16 DIAGNOSIS — I671 Cerebral aneurysm, nonruptured: Secondary | ICD-10-CM

## 2019-10-16 HISTORY — DX: Cerebral aneurysm, nonruptured: I67.1

## 2019-10-28 DIAGNOSIS — N362 Urethral caruncle: Secondary | ICD-10-CM

## 2019-10-28 HISTORY — DX: Urethral caruncle: N36.2

## 2020-02-05 DIAGNOSIS — M1A00X0 Idiopathic chronic gout, unspecified site, without tophus (tophi): Secondary | ICD-10-CM | POA: Insufficient documentation

## 2020-02-05 HISTORY — DX: Idiopathic chronic gout, unspecified site, without tophus (tophi): M1A.00X0

## 2020-02-11 ENCOUNTER — Other Ambulatory Visit: Payer: Self-pay | Admitting: Internal Medicine

## 2020-02-11 DIAGNOSIS — Z1231 Encounter for screening mammogram for malignant neoplasm of breast: Secondary | ICD-10-CM

## 2020-03-17 ENCOUNTER — Ambulatory Visit
Admission: RE | Admit: 2020-03-17 | Discharge: 2020-03-17 | Disposition: A | Discharge status: Home / Self Care | Payer: Medicare Other | Source: Ambulatory Visit | Attending: Internal Medicine | Admitting: Internal Medicine

## 2020-03-17 ENCOUNTER — Other Ambulatory Visit: Payer: Self-pay

## 2020-03-17 DIAGNOSIS — Z1231 Encounter for screening mammogram for malignant neoplasm of breast: Secondary | ICD-10-CM

## 2020-05-16 DIAGNOSIS — M150 Primary generalized (osteo)arthritis: Secondary | ICD-10-CM

## 2020-05-16 DIAGNOSIS — M8949 Other hypertrophic osteoarthropathy, multiple sites: Secondary | ICD-10-CM | POA: Insufficient documentation

## 2020-05-16 DIAGNOSIS — M159 Polyosteoarthritis, unspecified: Secondary | ICD-10-CM | POA: Insufficient documentation

## 2020-05-16 DIAGNOSIS — I700 Atherosclerosis of aorta: Secondary | ICD-10-CM | POA: Insufficient documentation

## 2020-05-16 HISTORY — DX: Atherosclerosis of aorta: I70.0

## 2020-05-16 HISTORY — DX: Primary generalized (osteo)arthritis: M15.0

## 2020-05-27 ENCOUNTER — Encounter: Payer: Self-pay | Admitting: Cardiology

## 2020-06-28 DIAGNOSIS — N815 Vaginal enterocele: Secondary | ICD-10-CM | POA: Insufficient documentation

## 2020-06-28 DIAGNOSIS — R7309 Other abnormal glucose: Secondary | ICD-10-CM | POA: Insufficient documentation

## 2020-06-28 DIAGNOSIS — N200 Calculus of kidney: Secondary | ICD-10-CM | POA: Insufficient documentation

## 2020-06-28 DIAGNOSIS — I714 Abdominal aortic aneurysm, without rupture, unspecified: Secondary | ICD-10-CM | POA: Insufficient documentation

## 2020-06-28 DIAGNOSIS — N8111 Cystocele, midline: Secondary | ICD-10-CM | POA: Insufficient documentation

## 2020-06-28 DIAGNOSIS — N8112 Cystocele, lateral: Secondary | ICD-10-CM | POA: Insufficient documentation

## 2020-07-11 NOTE — Progress Notes (Signed)
Cardiology Office Note:    Date:  07/12/2020   ID:  Shannon Walker, DOB 1932-07-06, MRN [id number]  PCP:  Mayer Camel, NP  Cardiologist:  Shirlee More, MD    Referring MD: Bess Harvest*    ASSESSMENT:    1. SVT (supraventricular tachycardia) ([place])   2. Symptomatic PVCs   3. Primary hypertension   4. Mixed hyperlipidemia   5. AAA (abdominal aortic aneurysm) without rupture (HCC)    PLAN:    In order of problems listed above:  1. She is having recurrent symptoms of rapid heart rhythm at night I am unsure whether this is a response sinus tachycardia versus arrhythmia.  We offered to place another ZIO monitor she is a bit frustrated I am going to put her on a low-dose calcium channel blocker short acting at bedtime along with her beta-blocker to try to blunt the symptoms and if ongoing and persistent she will need a ZIO monitor. 2. Stable BP blood pressure at target continue current treatment including long-acting calcium channel blocker 3. Stable continue current treatment fish oil 4. Do a formal ultrasound to define indeed if she has aneurysm suggested on screening in the community   Next appointment: 6 weeks   Medication Adjustments/Labs and Tests Ordered: Current medicines are reviewed at length with the patient today.  Concerns regarding medicines are outlined above.  Orders Placed This Encounter  Procedures  . EKG 12-Lead  . VAS [place] AAA DUPLEX   No orders of the defined types were placed in this encounter.   No chief complaint on file.   History of Present Illness:    Shannon Walker is a 87 y.o. female with a hx of SVT PVCs hypertension hyperlipidemia and hypothyroidism last seen by me 01/31/2018.  Most recently seen [facility] Dr. Elvis Coil 09/23/2018 and a virtual visit.  She had recurrent symptoms of palpitation and event monitor was ordered and I cannot see the report within Care Everywhere. Compliance with diet, lifestyle  and medications: Yes  Her daughter is present participates in evaluation decision making. She tells me she had a [facility] screening was told her abdominal aorta was over 3 cm and advised for me to check a duplex of her abdominal aorta regarding aneurysm. She is frustrated almost every evening she gets asleep wakes up feels her heart racing and each time she gets out of bed feels better and each time she is supine it starts again.  She checked her blood pressure during it it was normal and her heart rate was 114 bpm. No chest pain edema shortness of breath or syncope.  The episodes occur only at night.  Generally waxes and wanes throughout the night.  Recent labs PCP 05/16/2020: A1c normal at 5.8% CMP with a potassium 4.3 creatinine 0.7 GFR 78 cc Cholesterol 222 LDL 149 triglycerides 190 HDL 53 Hemoglobin 15.9 Past Medical History:  Diagnosis Date  . Acquired hypothyroidism 09/16/2015   Last Assessment & Plan:  Relevant Hx: Course: Daily Update: Today's Plan:continue to follow her reviewed her last level for her  Electronically signed by: Mayer Camel, NP 11/16/15 1241  . Allergic rhinitis due to pollen 11/02/2011  . Aneurysm of abdominal aorta (HCC)   . Arthritis, midfoot 09/16/2015  . Bilateral carotid artery stenosis 12/09/2012  . Calculus of kidney   . Chest pain 12/09/2012  . Cystocele, lateral   . Cystocele, midline   . Cystocele, unspecified (CODE) 09/16/2015  . Disorder of bone and  cartilage, unspecified   . Dyspnea and respiratory abnormality 12/09/2012  . Edema of left foot 06/12/2017  . Elevated blood pressure reading 12/09/2012  . Encounter for long-term (current) use of aspirin 12/09/2012  . Encounter for long-term (current) use of other medications 12/09/2012  . Headache 12/09/2012  . Hematuria, unspecified   . HTN (hypertension) 10/05/2013  . Hypertension   . Left foot pain 06/12/2017  . Localized osteoarthrosis not specified whether primary or secondary, unspecified  site   . Malaise and fatigue 12/09/2012   Last Assessment & Plan:  Relevant Hx: Course: Daily Update: Today's Plan:she is feeling her energy is returning for her of which she is really pleased with  Electronically signed by: Mayer Camel, NP 11/16/15 1247  . Menopausal disorder 09/16/2015   Last Assessment & Plan:  Relevant Hx: Course: Daily Update: Today's Plan:this is stable for her and discussed UTI and relationship with lack of estrogen her UA here today was clear for her which she was pleased with  Electronically signed by: Mayer Camel, NP 11/16/15 1246  . Mixed hyperlipidemia 09/16/2015   Last Assessment & Plan:  Relevant Hx: Course: Daily Update: Today's Plan:plan to see her lipids when she returns in September and update them then  Electronically signed by: Mayer Camel, NP 11/16/15 1246  . Morbid obesity ([place])   . Neoplasm of uncertain behavior of lip, oral cavity, 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  . Neoplasm of unspecified nature of bone, soft tissue, and skin   . Nonspecific abnormal cardiovascular system function study 12/09/2012  . Obesity with body mass index of 30.0-39.9 09/16/2015   Last Assessment & Plan:  Relevant Hx: Course: Daily Update: Today's Plan:she is doing the next 56 days and has lost 10 pounds of which she is very proud of as this is a lifestyle change for her and she is pleased with her efforts since she has never really been able to lose weight before and is hopeful she will be able to walk some with the weight loss  Electronically signed by: Welton Flakes  . Occlusion and stenosis of carotid artery 12/09/2012  . Occlusion and stenosis of multiple and bilateral precerebral arteries 12/09/2012  . Osteopenia 09/16/2015   Last Assessment & Plan:  Relevant Hx: Course: Daily Update: Today's Plan:reviewed with her the dexa scan last done advised her of need to take calcium and vitamin d  Electronically signed by: Mayer Camel, NP  09/19/15 2042  . Other abnormal glucose   . Other and unspecified hyperlipidemia   . Other malaise and fatigue   . Other specified pre-operative examination 12/09/2012  . Palpitations 10/05/2013  . Pre-syncope 10/05/2013  . Prediabetes 09/16/2015   Last Assessment & Plan:  Relevant Hx: Course: Daily Update: Today's Plan:her last level was stable for her at 5.9 and will continue to follow and hopefully will be lower for her with her weight loss  Electronically signed by: Mayer Camel, NP 11/16/15 1242  . Routine gynecological examination   . Shortness of breath 12/09/2012  . Subfibular impingement of left lower extremity 06/12/2017  . SVT (supraventricular tachycardia) ([place]) 05/23/2017  . Symptomatic PVCs 05/23/2017  . Symptoms involving cardiovascular system 12/09/2012  . Unspecified functional disorder of intestine   . Unspecified hypothyroidism   . Unspecified menopausal and postmenopausal disorder   . Urge incontinence   . Vaginal enterocele, congenital or acquired   . Vitamin D deficiency     Past Surgical History:  Procedure Laterality Date  .  CYSTOSCOPY    . REPLACEMENT TOTAL KNEE      Current Medications: Current Meds  Medication Sig  . ALPRAZolam (XANAX) 0.5 MG tablet TAKE 1/2 TO 1 TABLET BY MOUTH EVERY 8 HOURS AS NEEDED FOR RESCUE/ANXIETY   Refills after prescription expiration require follow-up evaluation at the office.  Marland Kitchen amLODipine (NORVASC) 5 MG tablet Take 1 tablet (5 mg total) by mouth 2 (two) times daily.  Marland Kitchen aspirin EC 81 MG tablet Take 81 mg by mouth daily.  Marland Kitchen levothyroxine (SYNTHROID, LEVOTHROID) 50 MCG tablet Take 50 mcg by mouth. TAKE 1 TABLET EVERYDAY ( 50 mcg )  EXCEPT Friday TAKE 2 TABLETS ( 100 mcg)  . Magnesium Gluconate 550 MG TABS Take 1 capsule by mouth daily.  . metoprolol succinate (TOPROL-XL) 50 MG 24 hr tablet Take 1 tablet (50 mg total) by mouth daily. Take with or immediately following a meal. (Patient taking differently: Take 50 mg by  mouth daily. Take 0.5 tablet (25 mg) daily)  . Omega-3 1000 MG CAPS Take 1 tablet by mouth daily.  . Vitamin D, Cholecalciferol, 1000 units CAPS Take 2,000 Units by mouth daily.   . [DISCONTINUED] vitamin B-12 (CYANOCOBALAMIN) 100 MCG tablet Take 1 tablet by mouth daily.     Allergies:   Bee venom, Celecoxib, Iodine, Sulfamethoxazole-trimethoprim, Iodinated diagnostic agents, and Nabumetone   Social History   Socioeconomic History  . Marital status: Married    Spouse name: Not on file  . Number of children: Not on file  . Years of education: Not on file  . Highest education level: Not on file  Occupational History  . Not on file  Tobacco Use  . Smoking status: Never Smoker  . Smokeless tobacco: Never Used  Vaping Use  . Vaping Use: Never used  Substance and Sexual Activity  . Alcohol use: No  . Drug use: No  . Sexual activity: Not on file  Other Topics Concern  . Not on file  Social History Narrative  . Not on file   Social Determinants of Health   Financial Resource Strain: Not on file  Food Insecurity: Not on file  Transportation Needs: Not on file  Physical Activity: Not on file  Stress: Not on file  Social Connections: Not on file     Family History: The patient's family history includes Clotting disorder in her father; Diabetes in her brother and sister; Heart Problems in her sister; Heart attack in her mother. ROS:   Please see the history of present illness.    All other systems reviewed and are negative.  EKGs/Labs/Other Studies Reviewed:    The following studies were reviewed today:  EKG:  EKG ordered today and personally reviewed.  The ekg ordered today demonstrates sinus rhythm normal EKG   Physical Exam:    VS:  BP 132/84   Pulse 92   Ht 5\' 3"  (1.6 m)   Wt 210 lb 3.2 oz (95.3 kg)   SpO2 98%   BMI 37.24 kg/m     Wt Readings from Last 3 Encounters:  07/12/20 210 lb 3.2 oz (95.3 kg)  01/31/18 214 lb 6.4 oz (97.3 kg)  09/10/17 216 lb (98 kg)      GEN:  Well nourished, well developed in no acute distress HEENT: Normal NECK: No JVD; No carotid bruits LYMPHATICS: No lymphadenopathy CARDIAC: RRR, no murmurs, rubs, gallops RESPIRATORY:  Clear to auscultation without rales, wheezing or rhonchi  ABDOMEN: Soft, non-tender, non-distended MUSCULOSKELETAL:  No edema; No deformity  SKIN:  Warm and dry NEUROLOGIC:  Alert and oriented x 3 PSYCHIATRIC:  Normal affect    Signed, Shirlee More, MD  07/12/2020 1:43 PM    [facility]

## 2020-07-12 ENCOUNTER — Encounter: Payer: Self-pay | Admitting: Cardiology

## 2020-07-12 ENCOUNTER — Ambulatory Visit: Payer: Medicare Other | Admitting: Cardiology

## 2020-07-12 ENCOUNTER — Other Ambulatory Visit: Payer: Self-pay

## 2020-07-12 VITALS — BP 132/84 | HR 92 | Ht 63.0 in | Wt 210.2 lb

## 2020-07-12 DIAGNOSIS — I493 Ventricular premature depolarization: Secondary | ICD-10-CM

## 2020-07-12 DIAGNOSIS — I714 Abdominal aortic aneurysm, without rupture, unspecified: Secondary | ICD-10-CM

## 2020-07-12 DIAGNOSIS — I10 Essential (primary) hypertension: Secondary | ICD-10-CM

## 2020-07-12 DIAGNOSIS — I471 Supraventricular tachycardia: Secondary | ICD-10-CM

## 2020-07-12 DIAGNOSIS — E782 Mixed hyperlipidemia: Secondary | ICD-10-CM

## 2020-07-12 MED ORDER — DILTIAZEM HCL 30 MG PO TABS: 30.0000 mg | ORAL_TABLET | Freq: Every day | ORAL | 3 refills | Status: DC

## 2020-07-12 NOTE — Patient Instructions (Signed)
Medication Instructions:  Your physician has recommended you make the following change in your medication:  START: Cardizem 30 mg take one tablet by mouth daily at bedtime.  *If you need a refill on your cardiac medications before your next appointment, please call your pharmacy*   Lab Work: None If you have labs (blood work) drawn today and your tests are completely normal, you will receive your results only by: Marland Kitchen MyChart Message (if you have MyChart) OR . A paper copy in the mail If you have any lab test that is abnormal or we need to change your treatment, we will call you to review the results.   Testing/Procedures: Your physician has requested that you have an abdominal aorta duplex. During this test, an ultrasound is used to evaluate the aorta. Allow 30 minutes for this exam. Do not eat after midnight the day before and avoid carbonated beverages    Follow-Up: At [facility], you and your health needs are our priority.  As part of our continuing mission to provide you with exceptional heart care, we have created designated Provider Care Teams.  These Care Teams include your primary Cardiologist (physician) and Advanced Practice Providers (APPs -  Physician Assistants and Nurse Practitioners) who all work together to provide you with the care you need, when you need it.  We recommend signing up for the patient portal called "MyChart".  Sign up information is provided on this After Visit Summary.  MyChart is used to connect with patients for Virtual Visits (Telemedicine).  Patients are able to view lab/test results, encounter notes, upcoming appointments, etc.  Non-urgent messages can be sent to your provider as well.   To learn more about what you can do with MyChart, go to [email or web address].    Your next appointment:   6 week(s)  The format for your next appointment:   In Person  Provider:   Shirlee More, MD   Other Instructions

## 2020-08-11 ENCOUNTER — Other Ambulatory Visit: Payer: Medicare Other

## 2020-08-23 DIAGNOSIS — M25571 Pain in right ankle and joints of right foot: Secondary | ICD-10-CM

## 2020-08-23 HISTORY — DX: Pain in right ankle and joints of right foot: M25.571

## 2020-08-26 ENCOUNTER — Other Ambulatory Visit: Payer: Self-pay

## 2020-08-26 ENCOUNTER — Ambulatory Visit (INDEPENDENT_AMBULATORY_CARE_PROVIDER_SITE_OTHER): Payer: Medicare Other

## 2020-08-26 DIAGNOSIS — I714 Abdominal aortic aneurysm, without rupture, unspecified: Secondary | ICD-10-CM

## 2020-08-26 NOTE — Progress Notes (Signed)
Abdominal aortic duplex exam performed.  Jimmy Jhoana Upham RDCS, RVT 

## 2020-08-29 ENCOUNTER — Telehealth: Payer: Self-pay

## 2020-08-29 NOTE — Telephone Encounter (Signed)
-----   Message from Richardo Priest, MD sent at 08/26/2020  8:47 PM EDT ----- She has aneurysm present larger than suspected from the screening results and will need to recheck in 6 mos

## 2020-08-29 NOTE — Progress Notes (Signed)
Cardiology Office Note:    Date:  08/30/2020   ID:  Shannon Walker, DOB Jun 25, 1932, MRN [id number]  PCP:  Mayer Camel, NP  Cardiologist:  Shirlee More, MD    Referring MD: Bess Harvest*    ASSESSMENT:    1. AAA (abdominal aortic aneurysm) without rupture ([place])   2. SVT (supraventricular tachycardia) ([place])   3. Symptomatic PVCs   4. Primary hypertension    PLAN:    In order of problems listed above:  1. Plan to recheck duplex 6 months if stable once yearly 2. She was not improved with taking calcium channel blocker at bedtime, switch to a more selective better antiarrhythmic beta-blocker Sectral. 3. Stable BP at target continue current treatment   Next appointment: 6 months   Medication Adjustments/Labs and Tests Ordered: Current medicines are reviewed at length with the patient today.  Concerns regarding medicines are outlined above.  Orders Placed This Encounter  Procedures  . VAS [place] AAA DUPLEX   Meds ordered this encounter  Medications  . acebutolol (SECTRAL) 200 MG capsule    Sig: Take 1 capsule (200 mg total) by mouth 2 (two) times daily.    Dispense:  180 capsule    Refill:  3    Chief Complaint  Patient presents with  . Follow-up    After duplex abdominal aorta    History of Present Illness:    Shannon Walker is a 87 y.o. female with a hx of SVT, symptomatic PVCs hypertension hyperlipidemia and abdominal aortic aneurysm last seen 07/12/2020 . Compliance with diet, lifestyle and medications: Yes   I reviewed the results of her duplex with her we will plan to do a follow-up in 6 months and if stable once yearly.  General referral is rapid progression or greater than 50 to 55 mm.  She is still bothered by palpitation and rapid heart rhythm particularly at nighttime was not helped by a calcium channel blocker stopped and will switch her to a speed along to see if we get better symptomatic relief.  No edema chest pain  shortness of breath  [facility] screening indicated mild aneurysm abdominal aorta showing she had a full vascular duplex swelling maximum diameter 3.9 cm distal abdominal aorta. Past Medical History:  Diagnosis Date  . Acquired hypothyroidism 09/16/2015   Last Assessment & Plan:  Relevant Hx: Course: Daily Update: Today's Plan:continue to follow her reviewed her last level for her  Electronically signed by: Mayer Camel, NP 11/16/15 1241  . Allergic rhinitis due to pollen 11/02/2011  . Aneurysm of abdominal aorta (HCC)   . Arthritis, midfoot 09/16/2015  . Bilateral carotid artery stenosis 12/09/2012  . Calculus of kidney   . Chest pain 12/09/2012  . Cystocele, lateral   . Cystocele, midline   . Cystocele, unspecified (CODE) 09/16/2015  . Disorder of bone and cartilage, unspecified   . Dyspnea and respiratory abnormality 12/09/2012  . Edema of left foot 06/12/2017  . Elevated blood pressure reading 12/09/2012  . Encounter for long-term (current) use of aspirin 12/09/2012  . Encounter for long-term (current) use of other medications 12/09/2012  . Headache 12/09/2012  . Hematuria, unspecified   . HTN (hypertension) 10/05/2013  . Hypertension   . Left foot pain 06/12/2017  . Localized osteoarthrosis not specified whether primary or secondary, unspecified site   . Malaise and fatigue 12/09/2012   Last Assessment & Plan:  Relevant Hx: Course: Daily Update: Today's Plan:she is feeling her energy is returning for her  of which she is really pleased with  Electronically signed by: Mayer Camel, NP 11/16/15 1247  . Menopausal disorder 09/16/2015   Last Assessment & Plan:  Relevant Hx: Course: Daily Update: Today's Plan:this is stable for her and discussed UTI and relationship with lack of estrogen her UA here today was clear for her which she was pleased with  Electronically signed by: Mayer Camel, NP 11/16/15 1246  . Mixed hyperlipidemia 09/16/2015   Last Assessment &  Plan:  Relevant Hx: Course: Daily Update: Today's Plan:plan to see her lipids when she returns in September and update them then  Electronically signed by: Mayer Camel, NP 11/16/15 1246  . Morbid obesity ([place])   . Neoplasm of uncertain behavior of lip, oral cavity, 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  . Neoplasm of unspecified nature of bone, soft tissue, and skin   . Nonspecific abnormal cardiovascular system function study 12/09/2012  . Obesity with body mass index of 30.0-39.9 09/16/2015   Last Assessment & Plan:  Relevant Hx: Course: Daily Update: Today's Plan:she is doing the next 56 days and has lost 10 pounds of which she is very proud of as this is a lifestyle change for her and she is pleased with her efforts since she has never really been able to lose weight before and is hopeful she will be able to walk some with the weight loss  Electronically signed by: Welton Flakes  . Occlusion and stenosis of carotid artery 12/09/2012  . Occlusion and stenosis of multiple and bilateral precerebral arteries 12/09/2012  . Osteopenia 09/16/2015   Last Assessment & Plan:  Relevant Hx: Course: Daily Update: Today's Plan:reviewed with her the dexa scan last done advised her of need to take calcium and vitamin d  Electronically signed by: Mayer Camel, NP 09/19/15 2042  . Other abnormal glucose   . Other and unspecified hyperlipidemia   . Other malaise and fatigue   . Other specified pre-operative examination 12/09/2012  . Palpitations 10/05/2013  . Pre-syncope 10/05/2013  . Prediabetes 09/16/2015   Last Assessment & Plan:  Relevant Hx: Course: Daily Update: Today's Plan:her last level was stable for her at 5.9 and will continue to follow and hopefully will be lower for her with her weight loss  Electronically signed by: Mayer Camel, NP 11/16/15 1242  . Routine gynecological examination   . Shortness of breath 12/09/2012  . Subfibular impingement of left lower extremity  06/12/2017  . SVT (supraventricular tachycardia) ([place]) 05/23/2017  . Symptomatic PVCs 05/23/2017  . Symptoms involving cardiovascular system 12/09/2012  . Unspecified functional disorder of intestine   . Unspecified hypothyroidism   . Unspecified menopausal and postmenopausal disorder   . Urge incontinence   . Vaginal enterocele, congenital or acquired   . Vitamin D deficiency     Past Surgical History:  Procedure Laterality Date  . CYSTOSCOPY    . REPLACEMENT TOTAL KNEE      Current Medications: Current Meds  Medication Sig  . acebutolol (SECTRAL) 200 MG capsule Take 1 capsule (200 mg total) by mouth 2 (two) times daily.  Marland Kitchen allopurinol (ZYLOPRIM) 100 MG tablet Take 1 tablet by mouth daily.  Marland Kitchen ALPRAZolam (XANAX) 0.5 MG tablet TAKE 1/2 TO 1 TABLET BY MOUTH EVERY 8 HOURS AS NEEDED FOR RESCUE/ANXIETY   Refills after prescription expiration require follow-up evaluation at the office.  Marland Kitchen amLODipine (NORVASC) 5 MG tablet Take 1 tablet (5 mg total) by mouth 2 (two) times daily.  Marland Kitchen aspirin EC 81  MG tablet Take 81 mg by mouth daily.  . colchicine 0.6 MG tablet Take 1 tablet by mouth 2 (two) times daily.  . cyanocobalamin 100 MCG tablet Take 1 tablet by mouth daily.  Marland Kitchen diltiazem (CARDIZEM) 30 MG tablet Take 1 tablet (30 mg total) by mouth at bedtime.  Marland Kitchen levothyroxine (SYNTHROID, LEVOTHROID) 50 MCG tablet Take 50 mcg by mouth. TAKE 1 TABLET EVERYDAY ( 50 mcg )  EXCEPT Friday TAKE 2 TABLETS ( 100 mcg)  . Magnesium Gluconate 550 MG TABS Take 1 capsule by mouth daily.  . Omega-3 1000 MG CAPS Take 1 tablet by mouth daily.  . Vitamin D, Cholecalciferol, 1000 units CAPS Take 2,000 Units by mouth daily.   . [DISCONTINUED] metoprolol succinate (TOPROL-XL) 50 MG 24 hr tablet Take 1 tablet (50 mg total) by mouth daily. Take with or immediately following a meal. (Patient taking differently: Take 50 mg by mouth daily. 0.5 TABLET ONCE DAILY)     Allergies:   Bee venom, Celecoxib, Iodine,  Sulfamethoxazole-trimethoprim, Codeine, Iodinated diagnostic agents, and Nabumetone   Social History   Socioeconomic History  . Marital status: Married    Spouse name: Not on file  . Number of children: Not on file  . Years of education: Not on file  . Highest education level: Not on file  Occupational History  . Not on file  Tobacco Use  . Smoking status: Never Smoker  . Smokeless tobacco: Never Used  Vaping Use  . Vaping Use: Never used  Substance and Sexual Activity  . Alcohol use: No  . Drug use: No  . Sexual activity: Not on file  Other Topics Concern  . Not on file  Social History Narrative  . Not on file   Social Determinants of Health   Financial Resource Strain: Not on file  Food Insecurity: Not on file  Transportation Needs: Not on file  Physical Activity: Not on file  Stress: Not on file  Social Connections: Not on file     Family History: The patient's family history includes Clotting disorder in her father; Diabetes in her brother and sister; Heart Problems in her sister; Heart attack in her mother. ROS:   Please see the history of present illness.    All other systems reviewed and are negative.  EKGs/Labs/Other Studies Reviewed:    The following studies were reviewed today:    Recent Labs: 10/01/2019 creatinine 0.97 BUN 14 glucose random 137  Physical Exam:    VS:  BP 112/78 (BP Location: Right Arm, Patient Position: Sitting)   Pulse (!) 108   Ht 5\' 3"  (1.6 m)   Wt 211 lb (95.7 kg)   SpO2 94%   BMI 37.38 kg/m     Wt Readings from Last 3 Encounters:  08/30/20 211 lb (95.7 kg)  07/12/20 210 lb 3.2 oz (95.3 kg)  01/31/18 214 lb 6.4 oz (97.3 kg)     GEN:  Well nourished, well developed in no acute distress HEENT: Normal NECK: No JVD; No carotid bruits LYMPHATICS: No lymphadenopathy CARDIAC: RRR, no murmurs, rubs, gallops RESPIRATORY:  Clear to auscultation without rales, wheezing or rhonchi  ABDOMEN: Soft, non-tender,  non-distended MUSCULOSKELETAL:  No edema; No deformity  SKIN: Warm and dry NEUROLOGIC:  Alert and oriented x 3 PSYCHIATRIC:  Normal affect    Signed, Shirlee More, MD  08/30/2020 3:33 PM    [facility]

## 2020-08-29 NOTE — Telephone Encounter (Signed)
Spoke with patient regarding results and recommendation.  Patient verbalizes understanding and is agreeable to plan of care. Advised patient to call back with any issues or concerns.  

## 2020-08-30 ENCOUNTER — Encounter: Payer: Self-pay | Admitting: Cardiology

## 2020-08-30 ENCOUNTER — Other Ambulatory Visit: Payer: Self-pay

## 2020-08-30 ENCOUNTER — Ambulatory Visit: Payer: Medicare Other | Admitting: Cardiology

## 2020-08-30 VITALS — BP 112/78 | HR 108 | Ht 63.0 in | Wt 211.0 lb

## 2020-08-30 DIAGNOSIS — I714 Abdominal aortic aneurysm, without rupture, unspecified: Secondary | ICD-10-CM

## 2020-08-30 DIAGNOSIS — I471 Supraventricular tachycardia: Secondary | ICD-10-CM | POA: Diagnosis not present

## 2020-08-30 DIAGNOSIS — I10 Essential (primary) hypertension: Secondary | ICD-10-CM

## 2020-08-30 DIAGNOSIS — I493 Ventricular premature depolarization: Secondary | ICD-10-CM | POA: Diagnosis not present

## 2020-08-30 MED ORDER — ACEBUTOLOL HCL 200 MG PO CAPS: 200.0000 mg | ORAL_CAPSULE | Freq: Two times a day (BID) | ORAL | 3 refills | Status: DC

## 2020-08-30 NOTE — Patient Instructions (Signed)
Medication Instructions:  Your physician has recommended you make the following change in your medication: STOP: Metoprolol  START: Acebutolol 200 mg take one tablet by mouth twice daily.  *If you need a refill on your cardiac medications before your next appointment, please call your pharmacy*   Lab Work: None If you have labs (blood work) drawn today and your tests are completely normal, you will receive your results only by: Marland [place] MyChart Message (if you have MyChart) OR . A paper copy in the mail If you have any lab test that is abnormal or we need to change your treatment, we will call you to review the results.   Testing/Procedures: Your physician has requested that you have an abdominal aorta duplex in October. During this test, an ultrasound is used to evaluate the aorta. Allow 30 minutes for this exam. Do not eat after midnight the day before and avoid carbonated beverages   Follow-Up: At [facility], you and your health needs are our priority.  As part of our continuing mission to provide you with exceptional heart care, we have created designated Provider Care Teams.  These Care Teams include your primary Cardiologist (physician) and Advanced Practice Providers (APPs -  Physician Assistants and Nurse Practitioners) who all work together to provide you with the care you need, when you need it.  We recommend signing up for the patient portal called "MyChart".  Sign up information is provided on this After Visit Summary.  MyChart is used to connect with patients for Virtual Visits (Telemedicine).  Patients are able to view lab/test results, encounter notes, upcoming appointments, etc.  Non-urgent messages can be sent to your provider as well.   To learn more about what you can do with MyChart, go to [email or web address].    Your next appointment:   6 month(s)  The format for your next appointment:   In Person  Provider:   Shirlee More, MD   Other Instructions

## 2020-08-31 ENCOUNTER — Ambulatory Visit: Payer: Medicare Other | Admitting: Cardiology

## 2021-03-01 ENCOUNTER — Other Ambulatory Visit: Payer: Self-pay | Admitting: Internal Medicine

## 2021-03-01 DIAGNOSIS — Z1231 Encounter for screening mammogram for malignant neoplasm of breast: Secondary | ICD-10-CM

## 2021-03-13 DIAGNOSIS — R778 Other specified abnormalities of plasma proteins: Secondary | ICD-10-CM | POA: Diagnosis not present

## 2021-03-13 DIAGNOSIS — R079 Chest pain, unspecified: Secondary | ICD-10-CM | POA: Diagnosis not present

## 2021-03-13 DIAGNOSIS — I340 Nonrheumatic mitral (valve) insufficiency: Secondary | ICD-10-CM | POA: Diagnosis not present

## 2021-03-13 DIAGNOSIS — I10 Essential (primary) hypertension: Secondary | ICD-10-CM | POA: Diagnosis not present

## 2021-03-13 DIAGNOSIS — R002 Palpitations: Secondary | ICD-10-CM | POA: Diagnosis not present

## 2021-03-20 DIAGNOSIS — I209 Angina pectoris, unspecified: Secondary | ICD-10-CM

## 2021-03-20 HISTORY — DX: Angina pectoris, unspecified: I20.9

## 2021-03-28 ENCOUNTER — Other Ambulatory Visit: Payer: Self-pay

## 2021-03-29 ENCOUNTER — Encounter: Payer: Self-pay | Admitting: Cardiology

## 2021-03-29 ENCOUNTER — Ambulatory Visit: Payer: Medicare Other | Admitting: Cardiology

## 2021-03-29 ENCOUNTER — Other Ambulatory Visit: Payer: Self-pay

## 2021-03-29 VITALS — BP 144/84 | HR 90 | Ht 62.0 in | Wt 207.6 lb

## 2021-03-29 DIAGNOSIS — Z91041 Radiographic dye allergy status: Secondary | ICD-10-CM

## 2021-03-29 DIAGNOSIS — R778 Other specified abnormalities of plasma proteins: Secondary | ICD-10-CM

## 2021-03-29 DIAGNOSIS — I209 Angina pectoris, unspecified: Secondary | ICD-10-CM | POA: Diagnosis not present

## 2021-03-29 DIAGNOSIS — I10 Essential (primary) hypertension: Secondary | ICD-10-CM

## 2021-03-29 MED ORDER — PREDNISONE 50 MG PO TABS: ORAL_TABLET | ORAL | 0 refills | Status: DC

## 2021-03-29 MED ORDER — ISOSORBIDE MONONITRATE ER 30 MG PO TB24: 30.0000 mg | ORAL_TABLET | Freq: Every day | ORAL | 3 refills | Status: DC

## 2021-03-29 MED ORDER — DIPHENHYDRAMINE HCL 50 MG PO TABS: 50.0000 mg | ORAL_TABLET | Freq: Once | ORAL | 0 refills | Status: DC

## 2021-03-29 NOTE — Progress Notes (Signed)
Cardiology Office Note:    Date:  03/29/2021   ID:  Shannon Walker, DOB 07-25-1932, MRN [id number]  PCP:  Mayer Camel, NP  Cardiologist:  Shirlee More, MD    Referring MD: Bess Harvest*    ASSESSMENT:    1. Angina pectoris (HCC)   2. Elevated troponin level   3. Allergy to contrast media (used for diagnostic x-rays)   4. Essential hypertension    PLAN:    In order of problems listed above:  She is having typical anginal symptoms and unusual nocturnal pattern.  Then had a late evening sustained-release nitroglycerin to her Medical regimen and we will go ahead and perform cardiac CTA with dye prep Stable hypertension continue rate limiting calcium channel blocker and beta-blocker.   Next appointment: 6 weeks   Medication Adjustments/Labs and Tests Ordered: Current medicines are reviewed at length with the patient today.  Concerns regarding medicines are outlined above.  Orders Placed This Encounter  Procedures   EKG 12-Lead   No orders of the defined types were placed in this encounter.   Chief complaint I continue to have chest pain at nighttime and was just recently in the hospital 2 weeks ago  History of Present Illness:    Shannon Walker is a 87 y.o. female with a hx of SVT symptomatic PVCs hypertension hyperlipidemia and abdominal aortic aneurysm last seen 08/30/2020.  Her aortic duplex performed 08/26/2020 showed maximum diameter abdominal aorta 39 mm.  Compliance with diet, lifestyle and medications: Yes  This is a welcome to very complex visit I was unaware she was at [facility] 03/13/2021 I have no record she was seen by my partners and she presented with what seemed to be typical angina occurring at rest nocturnally relieved with nitroglycerin her EKG was normal however troponin acid was elevated initial 0.12 peaks 0.40 CBC normal hemoglobin 14.4 creatinine normal 0.70 sodium 126 independently reviewed EKG showing sinus  rhythm and normal.  She was advised undergo further evaluation including coronary angiography she declined and was discharged from the hospital. She is very apprehensive and continues to have episodes that tend to occur about 12 midnight where she gets typical angina substernal pressure relieved after taking aspirin and nitroglycerin she does not have exertional symptoms not pleuritic in nature no associated GI symptoms or shortness of breath.  She is here to discuss modalities for further evaluation we discussed doing a perfusion study she does not want to we discussed a cardiac CTA she is concerned because she had a contrast dye allergy with urticaria more than 30 years ago I told her she can be steroid prepped and I think the best test for with elevated troponins either direct referral to coronary angiography or cardiac CTA and the patient and daughter elect to undergo cardiac CTA. Past Medical History:  Diagnosis Date   Acquired hypothyroidism 09/16/2015   Last Assessment & Plan:  Relevant Hx: Course: Daily Update: Today's Plan:continue to follow her reviewed her last level for her  Electronically signed by: Mayer Camel, NP 11/16/15 1241   Allergic rhinitis due to pollen 11/02/2011   Aneurysm of abdominal aorta    Arthritis, midfoot 09/16/2015   Bilateral carotid artery stenosis 12/09/2012   Calculus of kidney    Chest pain 12/09/2012   Cystocele, lateral    Cystocele, midline    Cystocele, unspecified (CODE) 09/16/2015   Disorder of bone and cartilage, unspecified    Dyspnea and respiratory abnormality 12/09/2012   Edema of  left foot 06/12/2017   Elevated blood pressure reading 12/09/2012   Encounter for long-term (current) use of aspirin 12/09/2012   Encounter for long-term (current) use of other medications 12/09/2012   Headache 12/09/2012   Hematuria, unspecified    HTN (hypertension) 10/05/2013   Hypertension    Left foot pain 06/12/2017   Localized osteoarthrosis not specified  whether primary or secondary, unspecified site    Malaise and fatigue 12/09/2012   Last Assessment & Plan:  Relevant Hx: Course: Daily Update: Today's Plan:she is feeling her energy is returning for her of which she is really pleased with  Electronically signed by: Mayer Camel, NP 11/16/15 1247   Menopausal disorder 09/16/2015   Last Assessment & Plan:  Relevant Hx: Course: Daily Update: Today's Plan:this is stable for her and discussed UTI and relationship with lack of estrogen her UA here today was clear for her which she was pleased with  Electronically signed by: Mayer Camel, NP 11/16/15 1246   Mixed hyperlipidemia 09/16/2015   Last Assessment & Plan:  Relevant Hx: Course: Daily Update: Today's Plan:plan to see her lipids when she returns in September and update them then  Electronically signed by: Mayer Camel, NP 11/16/15 1246   Morbid obesity ([place])    Neoplasm of uncertain behavior of lip, oral cavity, 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   Neoplasm of unspecified nature of bone, soft tissue, and skin    Nonspecific abnormal cardiovascular system function study 12/09/2012   Obesity with body mass index of 30.0-39.9 09/16/2015   Last Assessment & Plan:  Relevant Hx: Course: Daily Update: Today's Plan:she is doing the next 56 days and has lost 10 pounds of which she is very proud of as this is a lifestyle change for her and she is pleased with her efforts since she has never really been able to lose weight before and is hopeful she will be able to walk some with the weight loss  Electronically signed by: Ruthell Rummage Brow   Occlusion and stenosis of carotid artery 12/09/2012   Occlusion and stenosis of multiple and bilateral precerebral arteries 12/09/2012   Osteopenia 09/16/2015   Last Assessment & Plan:  Relevant Hx: Course: Daily Update: Today's Plan:reviewed with her the dexa scan last done advised her of need to take calcium and vitamin d  Electronically signed by:  Mayer Camel, NP 09/19/15 2042   Other abnormal glucose    Other and unspecified hyperlipidemia    Other malaise and fatigue    Other specified pre-operative examination 12/09/2012   Palpitations 10/05/2013   Pre-syncope 10/05/2013   Prediabetes 09/16/2015   Last Assessment & Plan:  Relevant Hx: Course: Daily Update: Today's Plan:her last level was stable for her at 5.9 and will continue to follow and hopefully will be lower for her with her weight loss  Electronically signed by: Mayer Camel, NP 11/16/15 1242   Routine gynecological examination    Shortness of breath 12/09/2012   Subfibular impingement of left lower extremity 06/12/2017   SVT (supraventricular tachycardia) ([place]) 05/23/2017   Symptomatic PVCs 05/23/2017   Symptoms involving cardiovascular system 12/09/2012   Unspecified functional disorder of intestine    Unspecified hypothyroidism    Unspecified menopausal and postmenopausal disorder    Urge incontinence    Vaginal enterocele, congenital or acquired    Vitamin D deficiency     Past Surgical History:  Procedure Laterality Date   CYSTOSCOPY     REPLACEMENT TOTAL KNEE  Current Medications: Current Meds  Medication Sig   ALPRAZolam (XANAX) 0.5 MG tablet TAKE 1/2 TO 1 TABLET BY MOUTH EVERY 8 HOURS AS NEEDED FOR RESCUE/ANXIETY   Refills after prescription expiration require follow-up evaluation at the office.   aspirin EC 81 MG tablet Take 81 mg by mouth daily.   cyanocobalamin 100 MCG tablet Take 1 tablet by mouth daily.   diltiazem (CARDIZEM) 30 MG tablet Take 1 tablet (30 mg total) by mouth at bedtime.   Garlic 416 MG CAPS Take 500 mg by mouth daily.   levothyroxine (SYNTHROID) 50 MCG tablet Take 50 mcg by mouth daily before breakfast.   Magnesium Gluconate 550 MG TABS Take 1 capsule by mouth daily.   metoprolol tartrate (LOPRESSOR) 25 MG tablet Take 25 mg by mouth 2 (two) times daily.   nitroGLYCERIN (NITROSTAT) 0.3 MG SL tablet Place  0.3 mg under the tongue as needed for chest pain.   Omega-3 1000 MG CAPS Take 1 tablet by mouth daily.   Vitamin D, Cholecalciferol, 1000 units CAPS Take 2,000 Units by mouth daily.      Allergies:   Bee venom, Celecoxib, Iodine, Sulfamethoxazole-trimethoprim, Codeine, Iodinated diagnostic agents, and Nabumetone   Social History   Socioeconomic History   Marital status: Married    Spouse name: Not on file   Number of children: Not on file   Years of education: Not on file   Highest education level: Not on file  Occupational History   Not on file  Tobacco Use   Smoking status: Never   Smokeless tobacco: Never  Vaping Use   Vaping Use: Never used  Substance and Sexual Activity   Alcohol use: No   Drug use: No   Sexual activity: Not on file  Other Topics Concern   Not on file  Social History Narrative   Not on file   Social Determinants of Health   Financial Resource Strain: Not on file  Food Insecurity: Not on file  Transportation Needs: Not on file  Physical Activity: Not on file  Stress: Not on file  Social Connections: Not on file     Family History: The patient's family history includes Clotting disorder in her father; Diabetes in her brother and sister; Heart Problems in her sister; Heart attack in her mother. ROS:   Please see the history of present illness.    All other systems reviewed and are negative.  EKGs/Labs/Other Studies Reviewed:    The following studies were reviewed today:  EKG:  EKG ordered today and personally reviewed.  The ekg ordered today demonstrates sinus rhythm normal EKG    Physical Exam:    VS:  BP (!) 144/84   Pulse 90   Ht 5\' 2"  (1.575 m)   Wt 207 lb 9.6 oz (94.2 kg)   SpO2 97%   BMI 37.97 kg/m     Wt Readings from Last 3 Encounters:  03/29/21 207 lb 9.6 oz (94.2 kg)  08/30/20 211 lb (95.7 kg)  07/12/20 210 lb 3.2 oz (95.3 kg)     GEN:  Well nourished, well developed in no acute distress HEENT: Normal NECK: No JVD;  No carotid bruits LYMPHATICS: No lymphadenopathy CARDIAC: RRR, no murmurs, rubs, gallops RESPIRATORY:  Clear to auscultation without rales, wheezing or rhonchi  ABDOMEN: Soft, non-tender, non-distended MUSCULOSKELETAL:  No edema; No deformity  SKIN: Warm and dry NEUROLOGIC:  Alert and oriented x 3 PSYCHIATRIC:  Normal affect    Signed, Shirlee More, MD  03/29/2021 2:54 PM  [facility]

## 2021-03-29 NOTE — Patient Instructions (Addendum)
Medication Instructions:  Your physician has recommended you make the following change in your medication:  START: Imdur 30 mg take one tablet by mouth daily at 6pm.  *If you need a refill on your cardiac medications before your next appointment, please call your pharmacy*   Lab Work: Your physician recommends that you return for lab work in: Within one week of your cardiac CT  BMP If you have labs (blood work) drawn today and your tests are completely normal, you will receive your results only by: [place] (if you have MyChart) OR A paper copy in the mail If you have any lab test that is abnormal or we need to change your treatment, we will call you to review the results.   Testing/Procedures:   Your cardiac CT will be scheduled at the below location:   [facility] [address] [place],  [address] [phone]  If scheduled at [facility], please arrive at the [facility] main entrance (entrance A) of [facility] 30 minutes prior to test start time. You can use the FREE valet parking offered at the main entrance (encouraged to control the heart rate for the test) Proceed to the [facility] Radiology Department (first floor) to check-in and test prep.  Please follow these instructions carefully (unless otherwise directed):  On the Night Before the Test: Be sure to Drink plenty of water. Do not consume any caffeinated/decaffeinated beverages or chocolate 12 hours prior to your test. Do not take any antihistamines 12 hours prior to your test. If the patient has contrast allergy: Patient will need a prescription for Prednisone and very clear instructions (as follows): Prednisone 50 mg - take 13 hours prior to test Take another Prednisone 50 mg 7 hours prior to test Take another Prednisone 50 mg 1 hour prior to test Take Benadryl 50 mg 1 hour prior to test Patient must complete all four doses of above prophylactic  medications. Patient will need a ride after test due to Benadryl.  On the Day of the Test: Drink plenty of water until 1 hour prior to the test. Do not eat any food 4 hours prior to the test. You may take your regular medications prior to the test.  Take metoprolol (Lopressor) two hours prior to test. FEMALES- please wear underwire-free bra if available, avoid dresses & tight clothing  After the Test: Drink plenty of water. After receiving IV contrast, you may experience a mild flushed feeling. This is normal. On occasion, you may experience a mild rash up to 24 hours after the test. This is not dangerous. If this occurs, you can take Benadryl 25 mg and increase your fluid intake. If you experience trouble breathing, this can be serious. If it is severe call 911 IMMEDIATELY. If it is mild, please call our office. If you take any of these medications: Glipizide/Metformin, Avandament, Glucavance, please do not take 48 hours after completing test unless otherwise instructed.  Please allow 2-4 weeks for scheduling of routine cardiac CTs. Some insurance companies require a pre-authorization which may delay scheduling of this test.   For non-scheduling related questions, please contact the cardiac imaging nurse navigator should you have any questions/concerns: Marchia Bond, Cardiac Imaging Nurse Navigator Gordy Clement, Cardiac Imaging Nurse Navigator [facility] Direct Office Dial: [phone]   For scheduling needs, including cancellations and rescheduling, please call [place], [phone].    Follow-Up: At [facility], you and your health needs are our priority.  As part of our continuing mission to provide you with exceptional heart care, we have created designated Provider Care Teams.  These Care Teams include your primary Cardiologist (physician) and Advanced Practice Providers (APPs -  Physician Assistants and Nurse Practitioners) who all work together  to provide you with the care you need, when you need it.  We recommend signing up for the patient portal called "MyChart".  Sign up information is provided on this After Visit Summary.  MyChart is used to connect with patients for Virtual Visits (Telemedicine).  Patients are able to view lab/test results, encounter notes, upcoming appointments, etc.  Non-urgent messages can be sent to your provider as well.   To learn more about what you can do with MyChart, go to [email or web address].    Your next appointment:   6 week(s)  The format for your next appointment:   In Person  Provider:   Shirlee More, MD   Other Instructions

## 2021-03-30 ENCOUNTER — Ambulatory Visit
Admission: RE | Admit: 2021-03-30 | Discharge: 2021-03-30 | Disposition: A | Discharge status: Home / Self Care | Payer: Medicare Other | Source: Ambulatory Visit | Attending: Internal Medicine | Admitting: Internal Medicine

## 2021-03-30 DIAGNOSIS — Z1231 Encounter for screening mammogram for malignant neoplasm of breast: Secondary | ICD-10-CM

## 2021-04-19 ENCOUNTER — Telehealth: Payer: Self-pay | Admitting: Cardiology

## 2021-04-19 DIAGNOSIS — I209 Angina pectoris, unspecified: Secondary | ICD-10-CM

## 2021-04-19 NOTE — Telephone Encounter (Signed)
   Heather pt's granddaughter called, she said, when pt's saw Dr. Bettina Gavia last 11/02 he said pt needs to get test done either an echo or stress test. No order on file

## 2021-04-19 NOTE — Telephone Encounter (Signed)
Spoke to the patients granddaughter just now and let her know that it was a cardiac CT that was ordered and that they are waiting on the approval from insurance to get this scheduled.   Encouraged patient to call back with any questions or concerns.

## 2021-04-23 ENCOUNTER — Emergency Department (HOSPITAL_COMMUNITY): Payer: Medicare Other

## 2021-04-23 ENCOUNTER — Inpatient Hospital Stay (HOSPITAL_COMMUNITY)
Admission: EM | Admit: 2021-04-23 | Discharge: 2021-04-27 | DRG: 282 | Disposition: A | Discharge status: Home / Self Care | Payer: Medicare Other | Attending: Internal Medicine | Admitting: Internal Medicine

## 2021-04-23 ENCOUNTER — Other Ambulatory Visit: Payer: Self-pay

## 2021-04-23 ENCOUNTER — Encounter (HOSPITAL_COMMUNITY): Payer: Self-pay | Admitting: Emergency Medicine

## 2021-04-23 DIAGNOSIS — E039 Hypothyroidism, unspecified: Secondary | ICD-10-CM | POA: Diagnosis present

## 2021-04-23 DIAGNOSIS — Z79899 Other long term (current) drug therapy: Secondary | ICD-10-CM

## 2021-04-23 DIAGNOSIS — Z832 Family history of diseases of the blood and blood-forming organs and certain disorders involving the immune mechanism: Secondary | ICD-10-CM

## 2021-04-23 DIAGNOSIS — Z8249 Family history of ischemic heart disease and other diseases of the circulatory system: Secondary | ICD-10-CM

## 2021-04-23 DIAGNOSIS — I10 Essential (primary) hypertension: Secondary | ICD-10-CM | POA: Diagnosis present

## 2021-04-23 DIAGNOSIS — M549 Dorsalgia, unspecified: Secondary | ICD-10-CM | POA: Diagnosis not present

## 2021-04-23 DIAGNOSIS — Z96659 Presence of unspecified artificial knee joint: Secondary | ICD-10-CM | POA: Diagnosis present

## 2021-04-23 DIAGNOSIS — I214 Non-ST elevation (NSTEMI) myocardial infarction: Secondary | ICD-10-CM | POA: Diagnosis not present

## 2021-04-23 DIAGNOSIS — Z888 Allergy status to other drugs, medicaments and biological substances status: Secondary | ICD-10-CM

## 2021-04-23 DIAGNOSIS — E782 Mixed hyperlipidemia: Secondary | ICD-10-CM | POA: Diagnosis present

## 2021-04-23 DIAGNOSIS — Z79890 Hormone replacement therapy (postmenopausal): Secondary | ICD-10-CM

## 2021-04-23 DIAGNOSIS — I714 Abdominal aortic aneurysm, without rupture, unspecified: Secondary | ICD-10-CM | POA: Diagnosis present

## 2021-04-23 DIAGNOSIS — Z833 Family history of diabetes mellitus: Secondary | ICD-10-CM

## 2021-04-23 DIAGNOSIS — Z7982 Long term (current) use of aspirin: Secondary | ICD-10-CM

## 2021-04-23 DIAGNOSIS — R079 Chest pain, unspecified: Secondary | ICD-10-CM | POA: Diagnosis not present

## 2021-04-23 DIAGNOSIS — Z20822 Contact with and (suspected) exposure to covid-19: Secondary | ICD-10-CM | POA: Diagnosis present

## 2021-04-23 DIAGNOSIS — I25110 Atherosclerotic heart disease of native coronary artery with unstable angina pectoris: Secondary | ICD-10-CM | POA: Diagnosis present

## 2021-04-23 DIAGNOSIS — Z6837 Body mass index (BMI) 37.0-37.9, adult: Secondary | ICD-10-CM

## 2021-04-23 DIAGNOSIS — R0602 Shortness of breath: Secondary | ICD-10-CM | POA: Diagnosis present

## 2021-04-23 DIAGNOSIS — I700 Atherosclerosis of aorta: Secondary | ICD-10-CM | POA: Diagnosis present

## 2021-04-23 DIAGNOSIS — Z91041 Radiographic dye allergy status: Secondary | ICD-10-CM

## 2021-04-23 DIAGNOSIS — E669 Obesity, unspecified: Secondary | ICD-10-CM | POA: Diagnosis present

## 2021-04-23 DIAGNOSIS — Z885 Allergy status to narcotic agent status: Secondary | ICD-10-CM

## 2021-04-23 DIAGNOSIS — Z7952 Long term (current) use of systemic steroids: Secondary | ICD-10-CM

## 2021-04-23 DIAGNOSIS — Z91030 Bee allergy status: Secondary | ICD-10-CM

## 2021-04-23 DIAGNOSIS — Z66 Do not resuscitate: Secondary | ICD-10-CM | POA: Diagnosis not present

## 2021-04-23 DIAGNOSIS — Z882 Allergy status to sulfonamides status: Secondary | ICD-10-CM

## 2021-04-23 DIAGNOSIS — F419 Anxiety disorder, unspecified: Secondary | ICD-10-CM | POA: Diagnosis not present

## 2021-04-23 LAB — CBC
HCT: 45.6 % (ref 36.0–46.0)
Hemoglobin: 15.6 g/dL — ABNORMAL HIGH (ref 12.0–15.0)
MCH: 33.4 pg (ref 26.0–34.0)
MCHC: 34.2 g/dL (ref 30.0–36.0)
MCV: 97.6 fL (ref 80.0–100.0)
Platelets: 267 10*3/uL (ref 150–400)
RBC: 4.67 MIL/uL (ref 3.87–5.11)
RDW: 13.4 % (ref 11.5–15.5)
WBC: 8.7 10*3/uL (ref 4.0–10.5)
nRBC: 0 % (ref 0.0–0.2)

## 2021-04-23 LAB — BASIC METABOLIC PANEL
Anion gap: 6 (ref 5–15)
BUN: 12 mg/dL (ref 8–23)
CO2: 28 mmol/L (ref 22–32)
Calcium: 9.2 mg/dL (ref 8.9–10.3)
Chloride: 106 mmol/L (ref 98–111)
Creatinine, Ser: 0.86 mg/dL (ref 0.44–1.00)
GFR, Estimated: >60 mL/min (ref >60)
Glucose, Bld: 106 mg/dL — ABNORMAL HIGH (ref 70–99)
Potassium: 4.1 mmol/L (ref 3.5–5.1)
Sodium: 140 mmol/L (ref 135–145)

## 2021-04-23 LAB — D-DIMER, QUANTITATIVE: D-Dimer, Quant: 1.34 ug/mL-FEU — ABNORMAL HIGH (ref 0.00–0.50)

## 2021-04-23 LAB — TROPONIN I (HIGH SENSITIVITY)
Troponin I (High Sensitivity): 7 ng/L (ref <18)
Troponin I (High Sensitivity): 7 ng/L (ref <18)

## 2021-04-23 LAB — TSH: TSH: 1.836 u[IU]/mL (ref 0.350–4.500)

## 2021-04-23 MED ORDER — DIPHENHYDRAMINE HCL 25 MG PO CAPS: 50.0000 mg | ORAL_CAPSULE | Freq: Once | ORAL | Status: AC

## 2021-04-23 MED ORDER — DIPHENHYDRAMINE HCL 50 MG/ML IJ SOLN
50.0000 mg | Freq: Once | INTRAMUSCULAR | Status: AC
  Administered 2021-04-23: 18:00:00 50 mg via INTRAVENOUS
  Filled 2021-04-23: qty 1

## 2021-04-23 MED ORDER — HYDROCORTISONE SOD SUC (PF) 250 MG IJ SOLR: 200.0000 mg | Freq: Once | INTRAMUSCULAR | Status: DC

## 2021-04-23 MED ORDER — HYDROCORTISONE SOD SUC (PF) 100 MG IJ SOLR
100.0000 mg | Freq: Once | INTRAMUSCULAR | Status: AC
  Administered 2021-04-23: 14:00:00 100 mg via INTRAVENOUS
  Filled 2021-04-23: qty 2

## 2021-04-23 MED ORDER — IOHEXOL 350 MG/ML SOLN
72.0000 mL | Freq: Once | INTRAVENOUS | Status: AC | PRN
  Administered 2021-04-23: 23:00:00 72 mL via INTRAVENOUS

## 2021-04-23 MED ORDER — ASPIRIN 325 MG PO TABS
325.0000 mg | ORAL_TABLET | Freq: Every day | ORAL | Status: DC
  Administered 2021-04-23 – 2021-04-24 (×2): 325 mg via ORAL
  Filled 2021-04-23 (×2): qty 1

## 2021-04-23 MED ORDER — DIPHENHYDRAMINE HCL 50 MG/ML IJ SOLN
50.0000 mg | Freq: Once | INTRAMUSCULAR | Status: AC
  Administered 2021-04-23: 22:00:00 50 mg via INTRAVENOUS
  Filled 2021-04-23: qty 1

## 2021-04-23 MED ORDER — HYDROCORTISONE SOD SUC (PF) 100 MG IJ SOLR: 100.0000 mg | Freq: Once | INTRAMUSCULAR | Status: DC

## 2021-04-23 NOTE — ED Notes (Signed)
Previous IV was not working for CT for this pt to have CTA.  Multiple attempts were made by CT and this RN to start another.  Pt needs IV team to start appropriate IV for CTA.

## 2021-04-23 NOTE — ED Provider Notes (Addendum)
[facility] EMERGENCY DEPARTMENT Provider Note   CSN: [id number] Arrival date & time: 04/23/21  1009     History Chief Complaint  Patient presents with   Chest Pain    Shannon Walker is a 87 y.o. female.   Chest Pain Associated symptoms: no abdominal pain, no back pain, no cough, no fever, no palpitations, no shortness of breath and no vomiting    87 year old female with medical history below to include angina pectoris, hypertension, SVT, symptomatic PVCs, presyncope, palpitations, carotid stenosis presenting to the emergency department with a chief complaint of chest pain.  The patient states that she developed left-sided chest pressure earlier today with associated heart palpitations.  She took her heart rate at home and felt that her pulse was elevated to the 140s to 160s.  She endorsed some radiation of her chest pain to her left shoulder.  Symptoms woke her up at 4 AM.  She took 2 nitroglycerin at home and symptoms did subsequently resolved.  She denies any shortness of breath, nausea or vomiting.  She denies any fevers or chills or cough.  Past Medical History:  Diagnosis Date   Acquired hypothyroidism 09/16/2015   Last Assessment & Plan:  Relevant Hx: Course: Daily Update: Today's Plan:continue to follow her reviewed her last level for her  Electronically signed by: Mayer Camel, NP 11/16/15 1241   Allergic rhinitis due to pollen 11/02/2011   Aneurysm of abdominal aorta    Arthritis, midfoot 09/16/2015   Bilateral carotid artery stenosis 12/09/2012   Calculus of kidney    Chest pain 12/09/2012   Cystocele, lateral    Cystocele, midline    Cystocele, unspecified (CODE) 09/16/2015   Disorder of bone and cartilage, unspecified    Dyspnea and respiratory abnormality 12/09/2012   Edema of left foot 06/12/2017   Elevated blood pressure reading 12/09/2012   Encounter for long-term (current) use of aspirin 12/09/2012   Encounter for long-term (current)  use of other medications 12/09/2012   Headache 12/09/2012   Hematuria, unspecified    HTN (hypertension) 10/05/2013   Hypertension    Left foot pain 06/12/2017   Localized osteoarthrosis not specified whether primary or secondary, unspecified site    Malaise and fatigue 12/09/2012   Last Assessment & Plan:  Relevant Hx: Course: Daily Update: Today's Plan:she is feeling her energy is returning for her of which she is really pleased with  Electronically signed by: Mayer Camel, NP 11/16/15 1247   Menopausal disorder 09/16/2015   Last Assessment & Plan:  Relevant Hx: Course: Daily Update: Today's Plan:this is stable for her and discussed UTI and relationship with lack of estrogen her UA here today was clear for her which she was pleased with  Electronically signed by: Mayer Camel, NP 11/16/15 1246   Mixed hyperlipidemia 09/16/2015   Last Assessment & Plan:  Relevant Hx: Course: Daily Update: Today's Plan:plan to see her lipids when she returns in September and update them then  Electronically signed by: Mayer Camel, NP 11/16/15 1246   Morbid obesity ([place])    Neoplasm of uncertain behavior of lip, oral cavity, 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   Neoplasm of unspecified nature of bone, soft tissue, and skin    Nonspecific abnormal cardiovascular system function study 12/09/2012   Obesity with body mass index of 30.0-39.9 09/16/2015   Last Assessment & Plan:  Relevant Hx: Course: Daily Update: Today's Plan:she is doing the next 56 days and has lost 10 pounds of which she  is very proud of as this is a lifestyle change for her and she is pleased with her efforts since she has never really been able to lose weight before and is hopeful she will be able to walk some with the weight loss  Electronically signed by: Ruthell Rummage Brow   Occlusion and stenosis of carotid artery 12/09/2012   Occlusion and stenosis of multiple and bilateral precerebral arteries 12/09/2012   Osteopenia  09/16/2015   Last Assessment & Plan:  Relevant Hx: Course: Daily Update: Today's Plan:reviewed with her the dexa scan last done advised her of need to take calcium and vitamin d  Electronically signed by: Mayer Camel, NP 09/19/15 2042   Other abnormal glucose    Other and unspecified hyperlipidemia    Other malaise and fatigue    Other specified pre-operative examination 12/09/2012   Palpitations 10/05/2013   Pre-syncope 10/05/2013   Prediabetes 09/16/2015   Last Assessment & Plan:  Relevant Hx: Course: Daily Update: Today's Plan:her last level was stable for her at 5.9 and will continue to follow and hopefully will be lower for her with her weight loss  Electronically signed by: Mayer Camel, NP 11/16/15 1242   Routine gynecological examination    Shortness of breath 12/09/2012   Subfibular impingement of left lower extremity 06/12/2017   SVT (supraventricular tachycardia) ([place]) 05/23/2017   Symptomatic PVCs 05/23/2017   Symptoms involving cardiovascular system 12/09/2012   Unspecified functional disorder of intestine    Unspecified hypothyroidism    Unspecified menopausal and postmenopausal disorder    Urge incontinence    Vaginal enterocele, congenital or acquired    Vitamin D deficiency     Patient Active Problem List   Diagnosis Date Noted   Angina pectoris ([place]) 03/20/2021   Acute right ankle pain 08/23/2020   Vaginal enterocele, congenital or acquired    Morbid obesity ([place])    Other abnormal glucose    Aneurysm of abdominal aorta    Calculus of kidney    Cystocele, lateral    Cystocele, midline    Aortic atherosclerosis ([place]) 05/16/2020   Primary osteoarthritis involving multiple joints 05/16/2020   Idiopathic chronic gout 02/05/2020   Urethral caruncle 10/28/2019   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   Carpal tunnel syndrome of right wrist 06/22/2019   Pain in right hand 05/07/2019   Mild episode of recurrent major depressive disorder ([place]) 12/06/2017    Gastroesophageal reflux disease without esophagitis 08/23/2017   Anxiety 08/21/2017   Edema of left foot 06/12/2017   Left foot pain 06/12/2017   Subfibular impingement of left lower extremity 06/12/2017   SVT (supraventricular tachycardia) ([place]) 05/23/2017   Symptomatic PVCs 05/23/2017   Urge incontinence 05/11/2016   Acquired hypothyroidism 09/16/2015   Arthritis, midfoot 09/16/2015   Cystocele, unspecified (CODE) 09/16/2015   Menopausal disorder 09/16/2015   Mixed hyperlipidemia 09/16/2015   Obesity with body mass index of 30.0-39.9 09/16/2015   Prediabetes 09/16/2015   Osteopenia 09/16/2015   Tumor of soft tissue of neck 09/16/2015   Vitamin D deficiency 09/16/2015   Pre-syncope 10/05/2013   Palpitations 10/05/2013   HTN (hypertension) 10/05/2013   Headache 12/09/2012   Bilateral carotid artery stenosis 12/09/2012   Chest pain 12/09/2012   Dyspnea and respiratory abnormality 12/09/2012   Shortness of breath 12/09/2012   Elevated blood pressure reading 12/09/2012   Encounter for long-term (current) use of aspirin 12/09/2012   Malaise and fatigue 12/09/2012   Nonspecific abnormal cardiovascular system function study 12/09/2012   Occlusion and stenosis  of carotid artery 12/09/2012   Occlusion and stenosis of multiple and bilateral precerebral arteries 12/09/2012   Encounter for long-term (current) use of other medications 12/09/2012   Other specified pre-operative examination 12/09/2012   Symptoms involving cardiovascular system 12/09/2012   Essential hypertension 12/09/2012   Allergic rhinitis due to pollen 11/02/2011   Neoplasm of uncertain behavior of lip, oral cavity, 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    Past Surgical History:  Procedure Laterality Date   CYSTOSCOPY     REPLACEMENT TOTAL KNEE       OB History   No obstetric history on file.     Family History  Problem Relation Age of Onset   Heart attack Mother    Clotting disorder Father    Heart Problems Sister     Diabetes Sister    Diabetes Brother     Social History   Tobacco Use   Smoking status: Never   Smokeless tobacco: Never  Vaping Use   Vaping Use: Never used  Substance Use Topics   Alcohol use: No   Drug use: No    Home Medications Prior to Admission medications   Medication Sig Start Date End Date Taking? Authorizing Provider  ALPRAZolam (XANAX) 0.5 MG tablet TAKE 1/2 TO 1 TABLET BY MOUTH EVERY 8 HOURS AS NEEDED FOR RESCUE/ANXIETY   Refills after prescription expiration require follow-up evaluation at the office. 01/23/18   [provider]  aspirin EC 81 MG tablet Take 81 mg by mouth daily.    [provider]  cyanocobalamin 100 MCG tablet Take 1 tablet by mouth daily.    [provider]  diltiazem (CARDIZEM) 30 MG tablet Take 1 tablet (30 mg total) by mouth at bedtime. 07/12/20   Richardo Priest, MD  diphenhydrAMINE (BENADRYL) 50 MG tablet Take 1 tablet (50 mg total) by mouth once for 1 dose. 03/29/21 03/29/21  Richardo Priest, MD  Garlic 240 MG CAPS Take 500 mg by mouth daily.    [provider]  isosorbide mononitrate (IMDUR) 30 MG 24 hr tablet Take 1 tablet (30 mg total) by mouth daily. 03/29/21 06/27/21  Richardo Priest, MD  levothyroxine (SYNTHROID) 50 MCG tablet Take 50 mcg by mouth daily before breakfast.    [provider]  Magnesium Gluconate 550 MG TABS Take 1 capsule by mouth daily.    [provider]  metoprolol tartrate (LOPRESSOR) 25 MG tablet Take 25 mg by mouth 2 (two) times daily. 03/15/21   [provider]  nitroGLYCERIN (NITROSTAT) 0.3 MG SL tablet Place 0.3 mg under the tongue as needed for chest pain. 03/20/21   [provider]  Omega-3 1000 MG CAPS Take 1 tablet by mouth daily.    [provider]  predniSONE (DELTASONE) 50 MG tablet Take one tablet 13 hours, 7 hours, and 1 hour prior to your cardiac CT 03/29/21   Richardo Priest, MD  Vitamin D, Cholecalciferol, 1000 units CAPS Take  2,000 Units by mouth daily.     [provider]    Allergies    Bee venom, Celecoxib, Iodine, Sulfamethoxazole-trimethoprim, Codeine, Iodinated diagnostic agents, and Nabumetone  Review of Systems   Review of Systems  Constitutional:  Negative for chills and fever.  HENT:  Negative for ear pain and sore throat.   Eyes:  Negative for pain and visual disturbance.  Respiratory:  Negative for cough and shortness of breath.   Cardiovascular:  Positive for chest pain. Negative for palpitations.  Gastrointestinal:  Negative for abdominal  pain and vomiting.  Genitourinary:  Negative for dysuria and hematuria.  Musculoskeletal:  Negative for arthralgias and back pain.  Skin:  Negative for color change and rash.  Neurological:  Negative for seizures and syncope.  All other systems reviewed and are negative.  Physical Exam Updated Vital Signs BP (!) 161/89 (BP Location: Left Arm)   Pulse 86   Temp 97.7 F (36.5 C)   Resp 18   SpO2 98%   Physical Exam Vitals and nursing note reviewed.  Constitutional:      General: She is not in acute distress.    Appearance: She is well-developed.  HENT:     Head: Normocephalic and atraumatic.  Eyes:     Conjunctiva/sclera: Conjunctivae normal.  Cardiovascular:     Rate and Rhythm: Normal rate and regular rhythm.     Heart sounds: No murmur heard. Pulmonary:     Effort: Pulmonary effort is normal. No respiratory distress.     Breath sounds: Normal breath sounds.  Abdominal:     Palpations: Abdomen is soft.     Tenderness: There is no abdominal tenderness.  Musculoskeletal:        General: No swelling.     Cervical back: Neck supple.  Skin:    General: Skin is warm and dry.     Capillary Refill: Capillary refill takes less than 2 seconds.  Neurological:     Mental Status: She is alert.  Psychiatric:        Mood and Affect: Mood normal.    ED Results / Procedures / Treatments   Labs (all labs ordered are listed, but only  abnormal results are displayed) Labs Reviewed  BASIC METABOLIC PANEL  CBC  TSH  TROPONIN I (HIGH SENSITIVITY)    EKG None  Radiology No results found.  Procedures Procedures   Medications Ordered in ED Medications - No data to display  ED Course  I have reviewed the triage vital signs and the nursing notes.  Pertinent labs & imaging results that were available during my care of the patient were reviewed by me and considered in my medical decision making (see chart for details).    MDM Rules/Calculators/A&P                           87 year old female with medical history above to include angina pectoris, hypertension, SVT, symptomatic PVCs, presyncope, palpitations, carotid stenosis presenting to the emergency department with a chief complaint of chest pain.  The patient states that she developed left-sided chest pressure earlier today with associated heart palpitations.  She took her heart rate at home and felt that her pulse was elevated to the 140s to 160s.  She endorsed some radiation of her chest pain to her left shoulder.  Symptoms woke her up at 4 AM.  She took 2 nitroglycerin at home and symptoms did subsequently resolved.  She denies any shortness of breath, nausea or vomiting.  She denies any fevers or chills or cough.  On arrival, the patient was afebrile, hemodynamically stable, mildly hypertensive BP 161/89, saturating well on room air.  Patient's symptoms are most concerning for unstable angina versus angina versus ACS versus cardiac arrhythmia.  Work-up initiated to include EKG, chest x-ray, screening labs to include D-dimer, BMP, CBC, TSH, troponins.  The patient's EKG was without ischemic changes.  Initial troponin resulted 7.  A CBC was without a leukocytosis, anemia or platelet abnormality, BMP was generally unremarkable and TSH was  normal.  The patient's D-dimer did result elevated to 1.34.  She does have a contrast allergy.  Plan for premedication for CTA PE  imaging to rule out PE.  Following PE study, will plan for cardiology consultation given the patient's concerning chest pain symptoms today.  She states that she has never had an ischemic evaluation was scheduled for a coronary CT.  Patient administered aspirin 325 mg orally.  Signout given to Dr. Almyra Free at 1600. Final Clinical Impression(s) / ED Diagnoses Final diagnoses:  None    Rx / DC Orders ED Discharge Orders     None        Regan Lemming, MD 04/23/21 1944    Regan Lemming, MD 04/23/21 1947

## 2021-04-23 NOTE — ED Notes (Signed)
Pt complaining of CP and asking for nitro upon returning from [place] and shown to Dr. Betsey Holiday who was notified of pt complaint.

## 2021-04-23 NOTE — ED Provider Notes (Signed)
Emergency Medicine Provider Triage Evaluation Note  Shannon Walker , a 87 y.o. female  was evaluated in triage.  Pt complains of chest pain.  Review of Systems  Positive: Chest pain, heart  palpitation Negative: Dizzy, fever, lighthead, n/v/d, sob  Physical Exam  BP (!) 161/89 (BP Location: Left Arm)   Pulse 86   Temp 97.7 F (36.5 C)   Resp 18   SpO2 98%  Gen:   Awake, no distress   Resp:  Normal effort  MSK:   Moves extremities without difficulty  Other:    Medical Decision Making  Medically screening exam initiated at 10:33 AM.  Appropriate orders placed.  Shannon Walker was informed that the remainder of the evaluation will be completed by another provider, this initial triage assessment does not replace that evaluation, and the importance of remaining in the ED until their evaluation is complete.  Pt report chest pressure earlier today and heart palpitation.  Sts heart rate was 160, took 2 nitros.  Sxs did improve.     Domenic Moras, PA-C 04/23/21 1055    Godfrey Pick, MD 04/25/21 [phone]

## 2021-04-23 NOTE — ED Notes (Signed)
CT notified that Benadryl has been given.  Will likely come to get pt around 1830.

## 2021-04-23 NOTE — ED Triage Notes (Signed)
C/o pain to L shoulder, shoulder blade, and center of chest that woke her up at 4am.  Denies SOB, nausea, and vomiting.  States BP was 181/90 and HR was 141.  Took 2 NTG and pain decreased.

## 2021-04-23 NOTE — ED Notes (Signed)
CT notified that pt has necessary IV's for CTA

## 2021-04-24 ENCOUNTER — Encounter (HOSPITAL_COMMUNITY)
Admission: EM | Disposition: A | Discharge status: Home / Self Care | Payer: Self-pay | Source: Home / Self Care | Attending: Internal Medicine

## 2021-04-24 ENCOUNTER — Encounter (HOSPITAL_COMMUNITY): Payer: Self-pay | Admitting: Family Medicine

## 2021-04-24 ENCOUNTER — Inpatient Hospital Stay (HOSPITAL_COMMUNITY): Payer: Medicare Other

## 2021-04-24 DIAGNOSIS — I2510 Atherosclerotic heart disease of native coronary artery without angina pectoris: Secondary | ICD-10-CM | POA: Diagnosis not present

## 2021-04-24 DIAGNOSIS — Z79899 Other long term (current) drug therapy: Secondary | ICD-10-CM | POA: Diagnosis not present

## 2021-04-24 DIAGNOSIS — Z7982 Long term (current) use of aspirin: Secondary | ICD-10-CM | POA: Diagnosis not present

## 2021-04-24 DIAGNOSIS — I214 Non-ST elevation (NSTEMI) myocardial infarction: Secondary | ICD-10-CM

## 2021-04-24 DIAGNOSIS — I10 Essential (primary) hypertension: Secondary | ICD-10-CM | POA: Diagnosis present

## 2021-04-24 DIAGNOSIS — Z832 Family history of diseases of the blood and blood-forming organs and certain disorders involving the immune mechanism: Secondary | ICD-10-CM | POA: Diagnosis not present

## 2021-04-24 DIAGNOSIS — E782 Mixed hyperlipidemia: Secondary | ICD-10-CM

## 2021-04-24 DIAGNOSIS — Z96659 Presence of unspecified artificial knee joint: Secondary | ICD-10-CM | POA: Diagnosis present

## 2021-04-24 DIAGNOSIS — Z882 Allergy status to sulfonamides status: Secondary | ICD-10-CM | POA: Diagnosis not present

## 2021-04-24 DIAGNOSIS — Z66 Do not resuscitate: Secondary | ICD-10-CM | POA: Diagnosis not present

## 2021-04-24 DIAGNOSIS — Z7952 Long term (current) use of systemic steroids: Secondary | ICD-10-CM | POA: Diagnosis not present

## 2021-04-24 DIAGNOSIS — R079 Chest pain, unspecified: Secondary | ICD-10-CM | POA: Diagnosis present

## 2021-04-24 DIAGNOSIS — Z885 Allergy status to narcotic agent status: Secondary | ICD-10-CM | POA: Diagnosis not present

## 2021-04-24 DIAGNOSIS — E039 Hypothyroidism, unspecified: Secondary | ICD-10-CM | POA: Diagnosis present

## 2021-04-24 DIAGNOSIS — E669 Obesity, unspecified: Secondary | ICD-10-CM | POA: Diagnosis not present

## 2021-04-24 DIAGNOSIS — Z8249 Family history of ischemic heart disease and other diseases of the circulatory system: Secondary | ICD-10-CM | POA: Diagnosis not present

## 2021-04-24 DIAGNOSIS — M549 Dorsalgia, unspecified: Secondary | ICD-10-CM | POA: Diagnosis not present

## 2021-04-24 DIAGNOSIS — Z6837 Body mass index (BMI) 37.0-37.9, adult: Secondary | ICD-10-CM | POA: Diagnosis not present

## 2021-04-24 DIAGNOSIS — Z20822 Contact with and (suspected) exposure to covid-19: Secondary | ICD-10-CM | POA: Diagnosis present

## 2021-04-24 DIAGNOSIS — Z79890 Hormone replacement therapy (postmenopausal): Secondary | ICD-10-CM | POA: Diagnosis not present

## 2021-04-24 DIAGNOSIS — Z888 Allergy status to other drugs, medicaments and biological substances status: Secondary | ICD-10-CM | POA: Diagnosis not present

## 2021-04-24 DIAGNOSIS — I714 Abdominal aortic aneurysm, without rupture, unspecified: Secondary | ICD-10-CM | POA: Diagnosis present

## 2021-04-24 DIAGNOSIS — I25110 Atherosclerotic heart disease of native coronary artery with unstable angina pectoris: Secondary | ICD-10-CM | POA: Diagnosis present

## 2021-04-24 DIAGNOSIS — I700 Atherosclerosis of aorta: Secondary | ICD-10-CM | POA: Diagnosis present

## 2021-04-24 DIAGNOSIS — F419 Anxiety disorder, unspecified: Secondary | ICD-10-CM | POA: Diagnosis not present

## 2021-04-24 DIAGNOSIS — Z833 Family history of diabetes mellitus: Secondary | ICD-10-CM | POA: Diagnosis not present

## 2021-04-24 HISTORY — DX: Non-ST elevation (NSTEMI) myocardial infarction: I21.4

## 2021-04-24 HISTORY — PX: LEFT HEART CATH AND CORONARY ANGIOGRAPHY: CATH118249

## 2021-04-24 LAB — ECHOCARDIOGRAM COMPLETE
Area-P 1/2: 4.29 cm2
Height: 62 in
MV VTI: 2.02 cm2
S' Lateral: 2.77 cm
Weight: 3315.72 oz

## 2021-04-24 LAB — CBC
HCT: 43.6 % (ref 36.0–46.0)
Hemoglobin: 14.5 g/dL (ref 12.0–15.0)
MCH: 32.6 pg (ref 26.0–34.0)
MCHC: 33.3 g/dL (ref 30.0–36.0)
MCV: 98 fL (ref 80.0–100.0)
Platelets: 266 10*3/uL (ref 150–400)
RBC: 4.45 MIL/uL (ref 3.87–5.11)
RDW: 13.4 % (ref 11.5–15.5)
WBC: 7.8 10*3/uL (ref 4.0–10.5)
nRBC: 0 % (ref 0.0–0.2)

## 2021-04-24 LAB — RESP PANEL BY RT-PCR (FLU A&B, COVID) ARPGX2
Influenza A by PCR: NEGATIVE
Influenza B by PCR: NEGATIVE
SARS Coronavirus 2 by RT PCR: NEGATIVE

## 2021-04-24 LAB — BASIC METABOLIC PANEL
Anion gap: 9 (ref 5–15)
BUN: 11 mg/dL (ref 8–23)
CO2: 24 mmol/L (ref 22–32)
Calcium: 8.7 mg/dL — ABNORMAL LOW (ref 8.9–10.3)
Chloride: 105 mmol/L (ref 98–111)
Creatinine, Ser: 0.68 mg/dL (ref 0.44–1.00)
GFR, Estimated: >60 mL/min (ref >60)
Glucose, Bld: 117 mg/dL — ABNORMAL HIGH (ref 70–99)
Potassium: 3.6 mmol/L (ref 3.5–5.1)
Sodium: 138 mmol/L (ref 135–145)

## 2021-04-24 LAB — TROPONIN I (HIGH SENSITIVITY)
Troponin I (High Sensitivity): 210 ng/L (ref <18)
Troponin I (High Sensitivity): 475 ng/L (ref <18)
Troponin I (High Sensitivity): 478 ng/L (ref <18)

## 2021-04-24 LAB — HEPARIN LEVEL (UNFRACTIONATED): Heparin Unfractionated: 0.31 IU/mL (ref 0.30–0.70)

## 2021-04-24 SURGERY — LEFT HEART CATH AND CORONARY ANGIOGRAPHY
Anesthesia: LOCAL

## 2021-04-24 MED ORDER — SODIUM CHLORIDE 0.9 % IV SOLN: INTRAVENOUS | Status: AC

## 2021-04-24 MED ORDER — HEPARIN BOLUS VIA INFUSION
4000.0000 [IU] | Freq: Once | INTRAVENOUS | Status: AC
  Administered 2021-04-24: 04:00:00 4000 [IU] via INTRAVENOUS
  Filled 2021-04-24: qty 4000

## 2021-04-24 MED ORDER — SODIUM CHLORIDE 0.9 % IV SOLN: INTRAVENOUS | Status: DC

## 2021-04-24 MED ORDER — SODIUM CHLORIDE 0.9% FLUSH: 3.0000 mL | INTRAVENOUS | Status: DC | PRN

## 2021-04-24 MED ORDER — OMEGA-3-ACID ETHYL ESTERS 1 G PO CAPS
1000.0000 mg | ORAL_CAPSULE | Freq: Every day | ORAL | Status: DC
  Administered 2021-04-24 – 2021-04-27 (×4): 1000 mg via ORAL
  Filled 2021-04-24 (×4): qty 1

## 2021-04-24 MED ORDER — LIDOCAINE HCL (PF) 1 % IJ SOLN
INTRAMUSCULAR | Status: AC
  Filled 2021-04-24: qty 30

## 2021-04-24 MED ORDER — ASPIRIN EC 81 MG PO TBEC
81.0000 mg | DELAYED_RELEASE_TABLET | Freq: Every day | ORAL | Status: DC
  Administered 2021-04-25 – 2021-04-27 (×3): 81 mg via ORAL
  Filled 2021-04-24 (×3): qty 1

## 2021-04-24 MED ORDER — MAGNESIUM GLUCONATE 500 MG PO TABS
500.0000 mg | ORAL_TABLET | Freq: Every day | ORAL | Status: DC
  Administered 2021-04-24 – 2021-04-27 (×4): 500 mg via ORAL
  Filled 2021-04-24 (×4): qty 1

## 2021-04-24 MED ORDER — IOHEXOL 350 MG/ML SOLN
INTRAVENOUS | Status: DC | PRN
  Administered 2021-04-24: 13:00:00 40 mL

## 2021-04-24 MED ORDER — VERAPAMIL HCL 2.5 MG/ML IV SOLN
INTRAVENOUS | Status: AC
  Filled 2021-04-24: qty 2

## 2021-04-24 MED ORDER — HEPARIN (PORCINE) IN NACL 1000-0.9 UT/500ML-% IV SOLN
INTRAVENOUS | Status: AC
  Filled 2021-04-24: qty 500

## 2021-04-24 MED ORDER — METOPROLOL TARTRATE 5 MG/5ML IV SOLN: 5.0000 mg | Freq: Four times a day (QID) | INTRAVENOUS | Status: DC | PRN

## 2021-04-24 MED ORDER — SODIUM CHLORIDE 0.9 % IV SOLN: 250.0000 mL | INTRAVENOUS | Status: DC | PRN

## 2021-04-24 MED ORDER — MORPHINE SULFATE (PF) 2 MG/ML IV SOLN: 2.0000 mg | INTRAVENOUS | Status: DC | PRN

## 2021-04-24 MED ORDER — HEPARIN (PORCINE) IN NACL 1000-0.9 UT/500ML-% IV SOLN
INTRAVENOUS | Status: DC | PRN
  Administered 2021-04-24 (×2): 500 mL

## 2021-04-24 MED ORDER — ALPRAZOLAM 0.25 MG PO TABS
0.2500 mg | ORAL_TABLET | Freq: Three times a day (TID) | ORAL | Status: DC | PRN
  Administered 2021-04-25: 0.25 mg via ORAL
  Filled 2021-04-24 (×2): qty 1

## 2021-04-24 MED ORDER — NITROGLYCERIN 0.4 MG SL SUBL
0.4000 mg | SUBLINGUAL_TABLET | SUBLINGUAL | Status: DC | PRN
  Administered 2021-04-24: 02:00:00 0.4 mg via SUBLINGUAL
  Filled 2021-04-24: qty 1

## 2021-04-24 MED ORDER — DIPHENHYDRAMINE HCL 50 MG/ML IJ SOLN
25.0000 mg | Freq: Once | INTRAMUSCULAR | Status: AC
  Administered 2021-04-24: 11:00:00 25 mg via INTRAVENOUS
  Filled 2021-04-24: qty 1

## 2021-04-24 MED ORDER — MIDAZOLAM HCL 2 MG/2ML IJ SOLN
INTRAMUSCULAR | Status: AC
  Filled 2021-04-24: qty 2

## 2021-04-24 MED ORDER — SODIUM CHLORIDE 0.9% FLUSH: 3.0000 mL | Freq: Two times a day (BID) | INTRAVENOUS | Status: DC

## 2021-04-24 MED ORDER — HEPARIN SODIUM (PORCINE) 1000 UNIT/ML IJ SOLN
INTRAMUSCULAR | Status: DC | PRN
  Administered 2021-04-24: 5000 [IU] via INTRAVENOUS

## 2021-04-24 MED ORDER — MIDAZOLAM HCL 2 MG/2ML IJ SOLN
INTRAMUSCULAR | Status: DC | PRN
  Administered 2021-04-24: 1 mg via INTRAVENOUS

## 2021-04-24 MED ORDER — HEPARIN (PORCINE) 25000 UT/250ML-% IV SOLN: 1100.0000 [IU]/h | INTRAVENOUS | Status: DC

## 2021-04-24 MED ORDER — HYDRALAZINE HCL 20 MG/ML IJ SOLN: 10.0000 mg | INTRAMUSCULAR | Status: AC | PRN

## 2021-04-24 MED ORDER — METHYLPREDNISOLONE SODIUM SUCC 125 MG IJ SOLR: 125.0000 mg | Freq: Once | INTRAMUSCULAR | Status: DC

## 2021-04-24 MED ORDER — LABETALOL HCL 5 MG/ML IV SOLN: 10.0000 mg | INTRAVENOUS | Status: AC | PRN

## 2021-04-24 MED ORDER — LIDOCAINE HCL (PF) 1 % IJ SOLN
INTRAMUSCULAR | Status: DC | PRN
  Administered 2021-04-24: 2 mL

## 2021-04-24 MED ORDER — METOPROLOL TARTRATE 25 MG PO TABS
25.0000 mg | ORAL_TABLET | Freq: Two times a day (BID) | ORAL | Status: DC
  Administered 2021-04-24 – 2021-04-27 (×7): 25 mg via ORAL
  Filled 2021-04-24 (×7): qty 1

## 2021-04-24 MED ORDER — ACETAMINOPHEN 325 MG PO TABS
650.0000 mg | ORAL_TABLET | Freq: Four times a day (QID) | ORAL | Status: DC | PRN
  Filled 2021-04-24: qty 2

## 2021-04-24 MED ORDER — LEVOTHYROXINE SODIUM 50 MCG PO TABS
50.0000 ug | ORAL_TABLET | Freq: Every day | ORAL | Status: DC
  Administered 2021-04-25 – 2021-04-27 (×3): 50 ug via ORAL
  Filled 2021-04-24 (×3): qty 1

## 2021-04-24 MED ORDER — METHYLPREDNISOLONE SODIUM SUCC 125 MG IJ SOLR
125.0000 mg | Freq: Once | INTRAMUSCULAR | Status: AC
  Administered 2021-04-24: 11:00:00 125 mg via INTRAVENOUS
  Filled 2021-04-24: qty 2

## 2021-04-24 MED ORDER — ONDANSETRON HCL 4 MG/2ML IJ SOLN: 4.0000 mg | Freq: Four times a day (QID) | INTRAMUSCULAR | Status: DC | PRN

## 2021-04-24 MED ORDER — LACTATED RINGERS IV SOLN: INTRAVENOUS | Status: DC

## 2021-04-24 MED ORDER — ONDANSETRON HCL 4 MG PO TABS: 4.0000 mg | ORAL_TABLET | Freq: Four times a day (QID) | ORAL | Status: DC | PRN

## 2021-04-24 MED ORDER — FENTANYL CITRATE (PF) 100 MCG/2ML IJ SOLN
INTRAMUSCULAR | Status: DC | PRN
  Administered 2021-04-24: 25 ug via INTRAVENOUS

## 2021-04-24 MED ORDER — FENTANYL CITRATE (PF) 100 MCG/2ML IJ SOLN
INTRAMUSCULAR | Status: AC
  Filled 2021-04-24: qty 2

## 2021-04-24 MED ORDER — VERAPAMIL HCL 2.5 MG/ML IV SOLN
INTRAVENOUS | Status: DC | PRN
  Administered 2021-04-24: 13:00:00 10 mL via INTRA_ARTERIAL

## 2021-04-24 MED ORDER — NITROGLYCERIN 0.3 MG SL SUBL: 0.3000 mg | SUBLINGUAL_TABLET | SUBLINGUAL | Status: DC | PRN

## 2021-04-24 MED ORDER — SODIUM CHLORIDE 0.9 % IV SOLN
INTRAVENOUS | Status: AC | PRN
  Administered 2021-04-24: 50 mL/h via INTRAVENOUS

## 2021-04-24 MED ORDER — HEPARIN (PORCINE) 25000 UT/250ML-% IV SOLN
1050.0000 [IU]/h | INTRAVENOUS | Status: DC
  Administered 2021-04-24: 04:00:00 1050 [IU]/h via INTRAVENOUS
  Filled 2021-04-24: qty 250

## 2021-04-24 MED ORDER — HEPARIN (PORCINE) 25000 UT/250ML-% IV SOLN
1400.0000 [IU]/h | INTRAVENOUS | Status: DC
  Administered 2021-04-24: 1100 [IU]/h via INTRAVENOUS
  Administered 2021-04-25: 1250 [IU]/h via INTRAVENOUS
  Administered 2021-04-26: 1400 [IU]/h via INTRAVENOUS
  Filled 2021-04-24 (×3): qty 250

## 2021-04-24 MED ORDER — HEPARIN SODIUM (PORCINE) 1000 UNIT/ML IJ SOLN
INTRAMUSCULAR | Status: AC
  Filled 2021-04-24: qty 10

## 2021-04-24 MED ORDER — ACETAMINOPHEN 650 MG RE SUPP: 650.0000 mg | Freq: Four times a day (QID) | RECTAL | Status: DC | PRN

## 2021-04-24 SURGICAL SUPPLY — 10 items
CATH 5FR JL3.5 JR4 ANG PIG MP (CATHETERS) ×1 IMPLANT
DEVICE RAD COMP TR BAND LRG (VASCULAR PRODUCTS) ×1 IMPLANT
GLIDESHEATH SLEND SS 6F .021 (SHEATH) ×1 IMPLANT
GUIDEWIRE INQWIRE 1.5J.035X260 (WIRE) IMPLANT
INQWIRE 1.5J .035X260CM (WIRE) ×2
KIT HEART LEFT (KITS) ×2 IMPLANT
PACK CARDIAC CATHETERIZATION (CUSTOM PROCEDURE TRAY) ×2 IMPLANT
TRANSDUCER W/STOPCOCK (MISCELLANEOUS) ×2 IMPLANT
TUBING CIL FLEX 10 FLL-RA (TUBING) ×2 IMPLANT
WIRE HI TORQ VERSACORE-J 145CM (WIRE) ×1 IMPLANT

## 2021-04-24 NOTE — Progress Notes (Signed)
RN unable to pull any cc of air from the TR band since patient's admission onto the unit as documented. RN paged and spoke to cath unit and will send a cath RN to assess the TR band. Patient states there is no pain at the TR band and no signs of hematoma. Relayed to pharmacy that the TR band is not off yet for the 1900 heparin gtt restart and pharmacy has since re-timed the heparin gtt for later on tonight.

## 2021-04-24 NOTE — H&P (Signed)
History and Physical    Shannon Walker DZH:[id number] DOB: 1932/06/26 DOA: 04/23/2021  PCP: Mayer Camel, NP   Patient coming from: Home  Chief Complaint: Chest pain, SOB, palpitations  HPI: Shannon Walker is a 87 y.o. female with medical history significant for angina, HTN, carotid stenosis, hypothyroidism, HLD who presents for evaluation of chest pain.  She reports that yesterday morning she developed left-sided chest pressure and pain that radiated to her left shoulder and into her left upper arm and was associated with shortness of breath and palpitations and mild nausea.  She did not have any jaw pain or vomiting.  She has had symptoms like this in the past and had been evaluated by cardiology recently.  Cardiology was having her undergo further outpatient testing but with her symptoms today she did not feel she could wait to be seen in the office during the week.  She noted that she felt her heart racing very fast and hard and her heart rate was in the 1 40-1 60 range initially.  She reports yesterday morning when the symptoms began she took 2 nitroglycerin and the chest pain resolved but recurred later on.  States she has not had any fever or chills.  While in the emergency room she had another episode of chest pressure with palpitations and shortness of breath that lasted for 10 to 15 minutes and then resolved.  Was given nitroglycerin in the emergency room for this episode.  ED Course: While in the emergency room patient had episodes of elevated blood pressure which improved with nitroglycerin.  Troponin was 7 on 2 occasions.  After her more recent episode of chest pain a third troponin was obtained and it was 210.  She does not have any acute ST elevation on EKG.  She had an elevated D-dimer level while in the emergency room and a CT angiography of her chest was negative for pulmonary embolism.  Cardiology was consulted and will see patient for further evaluation with her  repeated episodes of chest pain and elevated troponin.  Unremarkable.  Sodium 140 potassium 4.1 chloride 106 bicarb 28 creatinine 0.86 BUN 12 glucose 106, D-dimer 1.34 TSH 1.836.  COVID is pending.  Hospitalist service asked to admit for further management.  Patient was started on heparin infusion in the emergency room  Review of Systems:  General: Denies fever, chills, weight loss, night sweats.  Denies dizziness.  Denies change in appetite HENT: Denies head trauma, headache, denies change in hearing, tinnitus. Denies nasal bleeding.  Denies sore throat. Denies difficulty swallowing Eyes: Denies blurry vision, pain in eye, drainage.  Denies discoloration of eyes. Neck: Denies pain.  Denies swelling.  Denies pain with movement. Cardiovascular: Reports chest pain, palpitations.  Denies edema.  Denies orthopnea Respiratory: Reports shortness of breath. Denies cough.  Denies wheezing.  Denies sputum production Gastrointestinal: Denies abdominal pain, swelling.  Denies nausea, vomiting, diarrhea.  Denies melena.  Denies hematemesis. Musculoskeletal: Denies limitation of movement. Denies deformity or swelling. Denies arthralgias or myalgias. Genitourinary: Denies pelvic pain.  Denies urinary frequency or hesitancy.  Denies dysuria.  Skin: Denies rash.  Denies petechiae, purpura, ecchymosis. Neurological: Denies syncope.  Denies seizure activity. Denies paresthesia. Denies slurred speech, drooping face.  Denies visual change. Psychiatric: Denies depression, anxiety. Denies hallucinations.  Past Medical History:  Diagnosis Date   Acquired hypothyroidism 09/16/2015   Last Assessment & Plan:  Relevant Hx: Course: Daily Update: Today's Plan:continue to follow her reviewed her last level for her  Electronically signed by: Mayer Camel, NP 11/16/15 1241   Allergic rhinitis due to pollen 11/02/2011   Aneurysm of abdominal aorta    Arthritis, midfoot 09/16/2015   Bilateral carotid artery stenosis  12/09/2012   Calculus of kidney    Chest pain 12/09/2012   Cystocele, lateral    Cystocele, midline    Cystocele, unspecified (CODE) 09/16/2015   Disorder of bone and cartilage, unspecified    Dyspnea and respiratory abnormality 12/09/2012   Edema of left foot 06/12/2017   Elevated blood pressure reading 12/09/2012   Encounter for long-term (current) use of aspirin 12/09/2012   Encounter for long-term (current) use of other medications 12/09/2012   Headache 12/09/2012   Hematuria, unspecified    HTN (hypertension) 10/05/2013   Hypertension    Left foot pain 06/12/2017   Localized osteoarthrosis not specified whether primary or secondary, unspecified site    Malaise and fatigue 12/09/2012   Last Assessment & Plan:  Relevant Hx: Course: Daily Update: Today's Plan:she is feeling her energy is returning for her of which she is really pleased with  Electronically signed by: Mayer Camel, NP 11/16/15 1247   Menopausal disorder 09/16/2015   Last Assessment & Plan:  Relevant Hx: Course: Daily Update: Today's Plan:this is stable for her and discussed UTI and relationship with lack of estrogen her UA here today was clear for her which she was pleased with  Electronically signed by: Mayer Camel, NP 11/16/15 1246   Mixed hyperlipidemia 09/16/2015   Last Assessment & Plan:  Relevant Hx: Course: Daily Update: Today's Plan:plan to see her lipids when she returns in September and update them then  Electronically signed by: Mayer Camel, NP 11/16/15 1246   Morbid obesity (Early)    Neoplasm of uncertain behavior of lip, oral cavity, 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   Neoplasm of unspecified nature of bone, soft tissue, and skin    Nonspecific abnormal cardiovascular system function study 12/09/2012   Obesity with body mass index of 30.0-39.9 09/16/2015   Last Assessment & Plan:  Relevant Hx: Course: Daily Update: Today's Plan:she is doing the next 56 days and has lost 10 pounds of which she  is very proud of as this is a lifestyle change for her and she is pleased with her efforts since she has never really been able to lose weight before and is hopeful she will be able to walk some with the weight loss  Electronically signed by: Ruthell Rummage Brow   Occlusion and stenosis of carotid artery 12/09/2012   Occlusion and stenosis of multiple and bilateral precerebral arteries 12/09/2012   Osteopenia 09/16/2015   Last Assessment & Plan:  Relevant Hx: Course: Daily Update: Today's Plan:reviewed with her the dexa scan last done advised her of need to take calcium and vitamin d  Electronically signed by: Mayer Camel, NP 09/19/15 2042   Other abnormal glucose    Other and unspecified hyperlipidemia    Other malaise and fatigue    Other specified pre-operative examination 12/09/2012   Palpitations 10/05/2013   Pre-syncope 10/05/2013   Prediabetes 09/16/2015   Last Assessment & Plan:  Relevant Hx: Course: Daily Update: Today's Plan:her last level was stable for her at 5.9 and will continue to follow and hopefully will be lower for her with her weight loss  Electronically signed by: Mayer Camel, NP 11/16/15 1242   Routine gynecological examination    Shortness of breath 12/09/2012   Subfibular impingement of left lower extremity 06/12/2017  SVT (supraventricular tachycardia) ([place]) 05/23/2017   Symptomatic PVCs 05/23/2017   Symptoms involving cardiovascular system 12/09/2012   Unspecified functional disorder of intestine    Unspecified hypothyroidism    Unspecified menopausal and postmenopausal disorder    Urge incontinence    Vaginal enterocele, congenital or acquired    Vitamin D deficiency     Past Surgical History:  Procedure Laterality Date   CYSTOSCOPY     REPLACEMENT TOTAL KNEE      Social History  reports that she has never smoked. She has never used smokeless tobacco. She reports that she does not drink alcohol and does not use drugs.  Allergies   Allergen Reactions   Bee Venom Anaphylaxis   Celecoxib Other (See Comments)    Abdominal pain    Iodine Hives   Sulfamethoxazole-Trimethoprim Other (See Comments)    Restless legs, involuntary movements    Codeine Palpitations   Iodinated Diagnostic Agents Rash   Nabumetone Other (See Comments) and Palpitations    Tachycardia     Family History  Problem Relation Age of Onset   Heart attack Mother    Clotting disorder Father    Heart Problems Sister    Diabetes Sister    Diabetes Brother      Prior to Admission medications   Medication Sig Start Date End Date Taking? Authorizing Provider  ALPRAZolam Duanne Moron) 0.5 MG tablet Take 0.25 mg by mouth every 8 (eight) hours as needed for anxiety. 01/23/18  Yes [provider]  aspirin EC 81 MG tablet Take 81 mg by mouth every other day.   Yes [provider]  cyanocobalamin 100 MCG tablet Take 1 tablet by mouth daily.   Yes [provider]  Garlic 789 MG CAPS Take 500 mg by mouth daily.   Yes [provider]  levothyroxine (SYNTHROID) 50 MCG tablet Take 50 mcg by mouth daily before breakfast.   Yes [provider]  Magnesium Gluconate 550 MG TABS Take 1 capsule by mouth daily.   Yes [provider]  metoprolol tartrate (LOPRESSOR) 25 MG tablet Take 25 mg by mouth 2 (two) times daily. 03/15/21  Yes [provider]  nitroGLYCERIN (NITROSTAT) 0.3 MG SL tablet Place 0.3 mg under the tongue as needed for chest pain. 03/20/21  Yes [provider]  Omega-3 1000 MG CAPS Take 1 tablet by mouth daily.   Yes [provider]  Vitamin D, Cholecalciferol, 1000 units CAPS Take 2,000 Units by mouth daily.    Yes [provider]  diltiazem (CARDIZEM) 30 MG tablet Take 1 tablet (30 mg total) by mouth at bedtime. Patient not taking: Reported on 04/23/2021 07/12/20   Richardo Priest, MD  diphenhydrAMINE (BENADRYL) 50 MG tablet Take 1 tablet (50 mg total) by mouth once  for 1 dose. Patient not taking: Reported on 04/23/2021 03/29/21 03/29/21  Richardo Priest, MD  isosorbide mononitrate (IMDUR) 30 MG 24 hr tablet Take 1 tablet (30 mg total) by mouth daily. Patient not taking: Reported on 04/23/2021 03/29/21 06/27/21  Richardo Priest, MD  predniSONE (DELTASONE) 50 MG tablet Take one tablet 13 hours, 7 hours, and 1 hour prior to your cardiac CT Patient not taking: Reported on 04/23/2021 03/29/21   Richardo Priest, MD    Physical Exam: Vitals:   04/24/21 0220 04/24/21 0300 04/24/21 0330 04/24/21 0345  BP:  135/81 130/88 (!) 130/94  Pulse: 98 92 98 97  Resp: 14 15 18 16   Temp:  SpO2: 92% 93% 92% 91%  Weight:  94 kg    Height:  5\' 2"  (1.575 m)      Constitutional: NAD, calm, comfortable Vitals:   04/24/21 0220 04/24/21 0300 04/24/21 0330 04/24/21 0345  BP:  135/81 130/88 (!) 130/94  Pulse: 98 92 98 97  Resp: 14 15 18 16   Temp:      SpO2: 92% 93% 92% 91%  Weight:  94 kg    Height:  5\' 2"  (1.575 m)     General: WDWN, Alert and oriented x3.  Eyes: EOMI, PERRL, conjunctivae normal. Sclera nonicteric HENT:  [place]/AT, external ears normal.  Nares patent without epistasis.  Mucous membranes are moist.  Neck: Soft, normal range of motion, supple, no masses,  Trachea midline Respiratory: clear to auscultation bilaterally, no wheezing, no crackles. Normal respiratory effort. No accessory muscle use.  Cardiovascular: Regular rate and rhythm, no murmurs / rubs / gallops. No extremity edema. 2+ pedal pulses.   Abdomen: Soft, no tenderness, nondistended, no rebound or guarding.  No masses palpated. Bowel sounds normoactive Musculoskeletal: FROM. No cyanosis. No joint deformity upper and lower extremities. Normal muscle tone.  Skin: Warm, dry, intact no rashes, lesions, ulcers. No induration Neurologic: CN 2-12 grossly intact.  Normal speech.  Sensation intact to touch. Strength 5/5 in all extremities.   Psychiatric: Normal judgment and insight. Normal mood.     Labs on Admission: I have personally reviewed following labs and imaging studies  CBC: Recent Labs  Lab 04/23/21 1054  WBC 8.7  HGB 15.6*  HCT 45.6  MCV 97.6  PLT 785    Basic Metabolic Panel: Recent Labs  Lab 04/23/21 1054  NA 140  K 4.1  CL 106  CO2 28  GLUCOSE 106*  BUN 12  CREATININE 0.86  CALCIUM 9.2    GFR: Estimated Creatinine Clearance: 50.2 mL/min (by C-G formula based on SCr of 0.86 mg/dL).  Liver Function Tests: No results for input(s): AST, ALT, ALKPHOS, BILITOT, PROT, ALBUMIN in the last 168 hours.  Urine analysis: No results found for: COLORURINE, APPEARANCEUR, LABSPEC, [place], GLUCOSEU, HGBUR, BILIRUBINUR, KETONESUR, PROTEINUR, UROBILINOGEN, NITRITE, LEUKOCYTESUR  Radiological Exams on Admission: CT Angio Chest PE W and/or Wo Contrast  Result Date: 04/23/2021 CLINICAL DATA:  Positive D-dimer, PE suspected, low/intermediate probability. EXAM: CT ANGIOGRAPHY CHEST WITH CONTRAST TECHNIQUE: Multidetector CT imaging of the chest was performed using the standard protocol during bolus administration of intravenous contrast. Multiplanar CT image reconstructions and MIPs were obtained to evaluate the vascular anatomy. CONTRAST:  34mL OMNIPAQUE IOHEXOL 350 MG/ML SOLN COMPARISON:  04/23/2021, 08/23/2014. FINDINGS: Cardiovascular: The heart is enlarged there is no pericardial effusion. Coronary artery calcifications are noted. There is calcification of the mitral valve. Atherosclerotic calcification of the aorta without evidence of aneurysm. The pulmonary trunk is distended which may be associated with underlying pulmonary artery hypertension. No pulmonary artery filling defect is identified. Evaluation of the pulmonary arteries at the right lung base is limited due to compressive atelectasis. Mediastinum/Nodes: No enlarged mediastinal, hilar, or axillary lymph nodes. Thyroid gland, trachea, and esophagus demonstrate no significant findings. Lungs/Pleura: There is  elevation of the right diaphragm. Atelectasis is present at the lung bases bilaterally. No effusion or pneumothorax. Upper Abdomen: Small hiatal hernia. The gallbladder is surgically absent. There is a calcification in the left kidney measuring 4 mm. A hyperdense lesion is seen along the posterior aspect of the left kidney measuring 8 mm, possible proteinaceous or hemorrhagic cyst. Scattered diverticula are noted along the colon. Musculoskeletal:  There is compression deformity at T6 which is unchanged from 2016. Review of the MIP images confirms the above findings. IMPRESSION: 1. No evidence of pulmonary embolism or other acute process. 2. Elevation of the right diaphragm with atelectasis at the lung bases bilaterally. 3. Cardiomegaly with multi-vessel coronary artery calcifications. 4. Mildly distended pulmonary trunk which may be associated with underlying pulmonary artery hypertension. 5. Remaining findings as described above. Electronically Signed   By: Brett Fairy M.D.   On: 04/23/2021 23:39   DG Chest Port 1 View  Result Date: 04/23/2021 CLINICAL DATA:  Chest pain. EXAM: PORTABLE CHEST 1 VIEW COMPARISON:  03/13/2021 and older exams. FINDINGS: Cardiac silhouette is normal in size. No mediastinal or hilar masses. Lung volumes are low. There is bronchovascular crowding and linear lung base atelectasis. Lungs otherwise clear. No convincing pleural effusion and no pneumothorax. Skeletal structures are grossly intact. IMPRESSION: No acute cardiopulmonary disease. Electronically Signed   By: Lajean Manes M.D.   On: 04/23/2021 12:26    EKG: Independently reviewed.  EKG shows sinus tachycardia with nonspecific ST changes but no acute ST elevation or depression.  QTc 459  Assessment/Plan Principal Problem:   NSTEMI (non-ST elevated myocardial infarction) Ms. Maul is admitted to Cardiac Telemetry floor. Placed on heparin infusion for NSTEMI Check serial troponin levels Continue metoprolol Cardiology  consulted.   Active Problems:   Essential hypertension Continue metoprolol. Provide IV antihypertensive for SBP over 170.     Shortness of breath Supplemental oxygen as needed to keep O2 sat 92-96% Incentive spirometer every two hours while awake.     Mixed hyperlipidemia Continue Fish oil    Obesity with body mass index of 30.0-39.9   DVT prophylaxis: Patient is placed on a heparin infusion for anticoagulation, pharmacy to manage Code Status:   Full code Family Communication:  Diagnosis and plan discussed with patient and her daughter who is at bedside.  They both verbalized understanding agree with plan.  Questions answered.  Further recommendations to follow as clinically indicated Disposition Plan:   Patient is from:  Home  Anticipated DC to:  Home  Anticipated DC date:  Anticipate 2 midnight or more stay in the hospital  Consults called:  Cardiology  Admission status:  Inpatient  Yevonne Aline Jonny Dearden MD [facility]  How to contact the [facility] Attending or Consulting provider [place] or covering provider during after hours [place], for this patient?   Check the care team in [facility] and look for a) attending/consulting [facility] provider listed and b) the [facility] team listed Log into [email or web address] and use [address]'s universal password to access. If you do not have the password, please contact the hospital operator. Locate the [facility] provider you are looking for under [facility] and page to a number that you can be directly reached. If you still have difficulty reaching the provider, please page the [facility] (Director on Call) for the Hospitalists listed on amion for assistance.  04/24/2021, 4:13 AM

## 2021-04-24 NOTE — ED Notes (Signed)
Breakfast orders placed 

## 2021-04-24 NOTE — H&P (View-Only) (Signed)
Cardiology Consultation:   Patient ID: Shannon Walker MRN: [id number]; DOB: 1932-08-05  Admit date: 04/23/2021 Date of Consult: 04/24/2021  Primary Care Provider: Mayer Camel, NP Primary Cardiologist: Dr. Bettina Gavia Primary Electrophysiologist:  None    Patient Profile:   Shannon Walker is a 87 y.o. female with a hx of HTN, carotid stenosis, hypothyroidism, HLD who is being seen today for the evaluation of chest pain at the request of emergency department.  History of Present Illness:   Shannon Walker is an 87 year old female with a history of hypertension, hypothyroidism, hyperlipidemia who presents with progressive chest pain.  She has been having on and off typical chest pain for a while.  At her outpatient cardiology visit, the plan was to undergo CT of her coronaries as part of an outpatient evaluation, however her pain progressed and she felt that she could not wait.  Does she presented to the emergency department.  Initially in the emergency department her troponins ruled out.  However she had another episode of chest pressure with palpitations and shortness of breath that resolved with nitroglycerin.  Repeat troponin at that time was elevated to the 200s.  No acute EKG changes.  Cardiology was consulted for recommendations.  Past Medical History:  Diagnosis Date   Acquired hypothyroidism 09/16/2015   Last Assessment & Plan:  Relevant Hx: Course: Daily Update: Today's Plan:continue to follow her reviewed her last level for her  Electronically signed by: Mayer Camel, NP 11/16/15 1241   Allergic rhinitis due to pollen 11/02/2011   Aneurysm of abdominal aorta    Arthritis, midfoot 09/16/2015   Bilateral carotid artery stenosis 12/09/2012   Calculus of kidney    Chest pain 12/09/2012   Cystocele, lateral    Cystocele, midline    Cystocele, unspecified (CODE) 09/16/2015   Disorder of bone and cartilage, unspecified    Dyspnea and respiratory abnormality  12/09/2012   Edema of left foot 06/12/2017   Elevated blood pressure reading 12/09/2012   Encounter for long-term (current) use of aspirin 12/09/2012   Encounter for long-term (current) use of other medications 12/09/2012   Headache 12/09/2012   Hematuria, unspecified    HTN (hypertension) 10/05/2013   Hypertension    Left foot pain 06/12/2017   Localized osteoarthrosis not specified whether primary or secondary, unspecified site    Malaise and fatigue 12/09/2012   Last Assessment & Plan:  Relevant Hx: Course: Daily Update: Today's Plan:she is feeling her energy is returning for her of which she is really pleased with  Electronically signed by: Mayer Camel, NP 11/16/15 1247   Menopausal disorder 09/16/2015   Last Assessment & Plan:  Relevant Hx: Course: Daily Update: Today's Plan:this is stable for her and discussed UTI and relationship with lack of estrogen her UA here today was clear for her which she was pleased with  Electronically signed by: Mayer Camel, NP 11/16/15 1246   Mixed hyperlipidemia 09/16/2015   Last Assessment & Plan:  Relevant Hx: Course: Daily Update: Today's Plan:plan to see her lipids when she returns in September and update them then  Electronically signed by: Mayer Camel, NP 11/16/15 1246   Morbid obesity ([place])    Neoplasm of uncertain behavior of lip, oral cavity, 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   Neoplasm of unspecified nature of bone, soft tissue, and skin    Nonspecific abnormal cardiovascular system function study 12/09/2012   Obesity with body mass index of 30.0-39.9 09/16/2015   Last Assessment & Plan:  Relevant Hx:  Course: Daily Update: Today's Plan:she is doing the next 56 days and has lost 10 pounds of which she is very proud of as this is a lifestyle change for her and she is pleased with her efforts since she has never really been able to lose weight before and is hopeful she will be able to walk some with the weight loss  Electronically  signed by: Ruthell Rummage Brow   Occlusion and stenosis of carotid artery 12/09/2012   Occlusion and stenosis of multiple and bilateral precerebral arteries 12/09/2012   Osteopenia 09/16/2015   Last Assessment & Plan:  Relevant Hx: Course: Daily Update: Today's Plan:reviewed with her the dexa scan last done advised her of need to take calcium and vitamin d  Electronically signed by: Mayer Camel, NP 09/19/15 2042   Other abnormal glucose    Other and unspecified hyperlipidemia    Other malaise and fatigue    Other specified pre-operative examination 12/09/2012   Palpitations 10/05/2013   Pre-syncope 10/05/2013   Prediabetes 09/16/2015   Last Assessment & Plan:  Relevant Hx: Course: Daily Update: Today's Plan:her last level was stable for her at 5.9 and will continue to follow and hopefully will be lower for her with her weight loss  Electronically signed by: Mayer Camel, NP 11/16/15 1242   Routine gynecological examination    Shortness of breath 12/09/2012   Subfibular impingement of left lower extremity 06/12/2017   SVT (supraventricular tachycardia) ([place]) 05/23/2017   Symptomatic PVCs 05/23/2017   Symptoms involving cardiovascular system 12/09/2012   Unspecified functional disorder of intestine    Unspecified hypothyroidism    Unspecified menopausal and postmenopausal disorder    Urge incontinence    Vaginal enterocele, congenital or acquired    Vitamin D deficiency     Past Surgical History:  Procedure Laterality Date   CYSTOSCOPY     REPLACEMENT TOTAL KNEE       Home Medications:  Prior to Admission medications   Medication Sig Start Date End Date Taking? Authorizing Provider  ALPRAZolam Duanne Moron) 0.5 MG tablet Take 0.25 mg by mouth every 8 (eight) hours as needed for anxiety. 01/23/18  Yes [provider]  aspirin EC 81 MG tablet Take 81 mg by mouth every other day.   Yes [provider]  cyanocobalamin 100 MCG tablet Take 1 tablet by mouth  daily.   Yes [provider]  Garlic 517 MG CAPS Take 500 mg by mouth daily.   Yes [provider]  levothyroxine (SYNTHROID) 50 MCG tablet Take 50 mcg by mouth daily before breakfast.   Yes [provider]  Magnesium Gluconate 550 MG TABS Take 1 capsule by mouth daily.   Yes [provider]  metoprolol tartrate (LOPRESSOR) 25 MG tablet Take 25 mg by mouth 2 (two) times daily. 03/15/21  Yes [provider]  nitroGLYCERIN (NITROSTAT) 0.3 MG SL tablet Place 0.3 mg under the tongue as needed for chest pain. 03/20/21  Yes [provider]  Omega-3 1000 MG CAPS Take 1 tablet by mouth daily.   Yes [provider]  Vitamin D, Cholecalciferol, 1000 units CAPS Take 2,000 Units by mouth daily.    Yes [provider]  diltiazem (CARDIZEM) 30 MG tablet Take 1 tablet (30 mg total) by mouth at bedtime. Patient not taking: Reported on 04/23/2021 07/12/20   Richardo Priest, MD  diphenhydrAMINE (BENADRYL) 50 MG tablet Take 1 tablet (50 mg total) by mouth once for 1 dose. Patient not taking: Reported  on 04/23/2021 03/29/21 03/29/21  Richardo Priest, MD  isosorbide mononitrate (IMDUR) 30 MG 24 hr tablet Take 1 tablet (30 mg total) by mouth daily. Patient not taking: Reported on 04/23/2021 03/29/21 06/27/21  Richardo Priest, MD  predniSONE (DELTASONE) 50 MG tablet Take one tablet 13 hours, 7 hours, and 1 hour prior to your cardiac CT Patient not taking: Reported on 04/23/2021 03/29/21   Richardo Priest, MD    Inpatient Medications: Scheduled Meds:  aspirin  325 mg Oral Daily   hydrocortisone sod succinate (SOLU-CORTEF) inj  100 mg Intravenous Once   Continuous Infusions:  heparin 1,050 Units/hr (04/24/21 0356)   PRN Meds: nitroGLYCERIN  Allergies:    Allergies  Allergen Reactions   Bee Venom Anaphylaxis   Celecoxib Other (See Comments)    Abdominal pain    Iodine Hives   Sulfamethoxazole-Trimethoprim Other (See Comments)    Restless  legs, involuntary movements    Codeine Palpitations   Iodinated Diagnostic Agents Rash   Nabumetone Other (See Comments) and Palpitations    Tachycardia     Social History:   Social History   Socioeconomic History   Marital status: Married    Spouse name: Not on file   Number of children: Not on file   Years of education: Not on file   Highest education level: Not on file  Occupational History   Not on file  Tobacco Use   Smoking status: Never   Smokeless tobacco: Never  Vaping Use   Vaping Use: Never used  Substance and Sexual Activity   Alcohol use: No   Drug use: No   Sexual activity: Not on file  Other Topics Concern   Not on file  Social History Narrative   Not on file   Social Determinants of Health   Financial Resource Strain: Not on file  Food Insecurity: Not on file  Transportation Needs: Not on file  Physical Activity: Not on file  Stress: Not on file  Social Connections: Not on file  Intimate Partner Violence: Not on file    Family History:    Family History  Problem Relation Age of Onset   Heart attack Mother    Clotting disorder Father    Heart Problems Sister    Diabetes Sister    Diabetes Brother      Review of Systems: [y] = yes, _0  = no    General: Weight gain _1 ; Weight loss _2 ; Anorexia _3 ; Fatigue _4 ; Fever _5 ; Chills _6 ; Weakness _7   Cardiac: Chest pain/pressure _8 ; Resting SOB _9 ; Exertional SOB _10 ; Orthopnea _11 ; Pedal Edema _12 ; Palpitations _13 ; Syncope _14 ; Presyncope _15 ; Paroxysmal nocturnal dyspnea_16   Pulmonary: Cough _17 ; Wheezing_18 ; Hemoptysis_19 ; Sputum _20 ; Snoring _21   GI: Vomiting_22 ; Dysphagia_23 ; Melena_24 ; Hematochezia _25 ; Heartburn_26 ; Abdominal pain _27 ; Constipation _28 ; Diarrhea _29 ; BRBPR _30   GU: Hematuria_31 ; Dysuria _32 ; Nocturia_33   Vascular: Pain in legs with walking _34 ; Pain in feet with lying flat _35 ; Non-healing sores _36 ; Stroke _37 ; TIA _38 ; Slurred speech _39 ;  Neuro: Headaches_40 ;  Vertigo_41 ; Seizures_42 ; Paresthesias_43 ;Blurred vision _44 ; Diplopia _45 ; Vision changes _46   Ortho/Skin: Arthritis _47 ; Joint pain _48 ; Muscle pain _49 ; Joint swelling _50 ; Back Pain _51 ; Rash _52   Psych:  Depression_0 ; Anxiety_1   Heme: Bleeding problems _2 ; Clotting disorders _3 ; Anemia _4   Endocrine: Diabetes _5 ; Thyroid dysfunction_6   Physical Exam/Data:   Vitals:   04/24/21 0430 04/24/21 0445 04/24/21 0500 04/24/21 0515  BP: (!) 150/96 (!) 142/90 123/82 128/83  Pulse: 86 87 91 97  Resp: _7 Temp:      SpO2: 94% 91% 90% 92%  Weight:      Height:       No intake or output data in the 24 hours ending 04/24/21 0533 Filed Weights   04/24/21 0300  Weight: 94 kg   Body mass index is 37.9 kg/m.  General:  Well nourished, well developed, in no acute distress HEENT: normal Lymph: no adenopathy Neck: no JVD Endocrine:  No thryomegaly Vascular: No carotid bruits; FA pulses 2+ bilaterally without bruits  Cardiac:  normal S1, S2; RRR; no murmur  Lungs:  clear to auscultation bilaterally, no wheezing, rhonchi or rales  Abd: soft, nontender, no hepatomegaly  Ext: no edema Musculoskeletal:  No deformities, BUE and BLE strength normal and equal Skin: warm and dry  Neuro:  CNs 2-12 intact, no focal abnormalities noted Psych:  Normal affect   EKG:  The EKG was personally reviewed and demonstrates:  no st or t wave changes Telemetry:  Telemetry was personally reviewed and demonstrates:  sinus rhythm  Relevant CV Studies: Echo 03/15/2021: preserved EF with diastolic dysfunction. Mild-mod MR and MAC present.   Laboratory Data:  Chemistry Recent Labs  Lab 04/23/21 1054  NA 140  K 4.1  CL 106  CO2 28  GLUCOSE 106*  BUN 12  CREATININE 0.86  CALCIUM 9.2  GFRNONAA >60  ANIONGAP 6    No results for input(s): PROT, ALBUMIN, AST, ALT, ALKPHOS, BILITOT in the last 168 hours. Hematology Recent Labs  Lab 04/23/21 1054  WBC 8.7  RBC 4.67  HGB 15.6*  HCT 45.6   MCV 97.6  MCH 33.4  MCHC 34.2  RDW 13.4  PLT 267   Cardiac EnzymesNo results for input(s): TROPONINI in the last 168 hours. No results for input(s): TROPIPOC in the last 168 hours.  BNPNo results for input(s): BNP, PROBNP in the last 168 hours.  DDimer  Recent Labs  Lab 04/23/21 1303  DDIMER 1.34*    Radiology/Studies:  CT Angio Chest PE W and/or Wo Contrast  Result Date: 04/23/2021 CLINICAL DATA:  Positive D-dimer, PE suspected, low/intermediate probability. EXAM: CT ANGIOGRAPHY CHEST WITH CONTRAST TECHNIQUE: Multidetector CT imaging of the chest was performed using the standard protocol during bolus administration of intravenous contrast. Multiplanar CT image reconstructions and MIPs were obtained to evaluate the vascular anatomy. CONTRAST:  63m OMNIPAQUE IOHEXOL 350 MG/ML SOLN COMPARISON:  04/23/2021, 08/23/2014. FINDINGS: Cardiovascular: The heart is enlarged there is no pericardial effusion. Coronary artery calcifications are noted. There is calcification of the mitral valve. Atherosclerotic calcification of the aorta without evidence of aneurysm. The pulmonary trunk is distended which may be associated with underlying pulmonary artery hypertension. No pulmonary artery filling defect is identified. Evaluation of the pulmonary arteries at the right lung base is limited due to compressive atelectasis. Mediastinum/Nodes: No enlarged mediastinal, hilar, or axillary lymph nodes. Thyroid gland, trachea, and esophagus demonstrate no significant findings. Lungs/Pleura: There is elevation of the right diaphragm. Atelectasis is present at the lung bases bilaterally. No effusion or pneumothorax. Upper Abdomen: Small hiatal hernia. The gallbladder is surgically absent. There is a calcification in the left kidney measuring 4  mm. A hyperdense lesion is seen along the posterior aspect of the left kidney measuring 8 mm, possible proteinaceous or hemorrhagic cyst. Scattered diverticula are noted along the  colon. Musculoskeletal: There is compression deformity at T6 which is unchanged from 2016. Review of the MIP images confirms the above findings. IMPRESSION: 1. No evidence of pulmonary embolism or other acute process. 2. Elevation of the right diaphragm with atelectasis at the lung bases bilaterally. 3. Cardiomegaly with multi-vessel coronary artery calcifications. 4. Mildly distended pulmonary trunk which may be associated with underlying pulmonary artery hypertension. 5. Remaining findings as described above. Electronically Signed   By: Brett Fairy M.D.   On: 04/23/2021 23:39   DG Chest Port 1 View  Result Date: 04/23/2021 CLINICAL DATA:  Chest pain. EXAM: PORTABLE CHEST 1 VIEW COMPARISON:  03/13/2021 and older exams. FINDINGS: Cardiac silhouette is normal in size. No mediastinal or hilar masses. Lung volumes are low. There is bronchovascular crowding and linear lung base atelectasis. Lungs otherwise clear. No convincing pleural effusion and no pneumothorax. Skeletal structures are grossly intact. IMPRESSION: No acute cardiopulmonary disease. Electronically Signed   By: Lajean Manes M.D.   On: 04/23/2021 12:26    Assessment and Plan:   NSTEMI - load with asa and start heparin - high intensity statin  - nitroglycerin for pain - should repeat Echo prior to discharge - continue home metoprolol - npo, plan for left heart cath       For questions or updates, please contact [facility] Please consult [email or web address] for contact info under     Signed, Doyne Keel, MD  04/24/2021 5:33 AM  I have examined the patient and reviewed assessment and plan and discussed with patient.  Agree with above as stated.    The patient understands that risks include but are not limited to stroke (1 in 1000), death (1 in 26), kidney failure [usually temporary] (1 in 500), bleeding (1 in 200), allergic reaction [possibly serious] (1 in 200), and agrees to proceed.    Patient with accelerating  angina.  Last troponin was positive.  All questions about cardiac cath were answered.  She is concerned about her anxiety most of all.  She will need some anxiolytics prior to the start of the cath procedure.  She does take some antianxiety medicines at home on occasion as well.    She has had a rash with prior contrast agents.  Will need to premedicate prior to cath.  She understands why a CT angiogram is no longer indicated and is willing to proceed with cardiac cath.  Further plans based on catheterization.  She will need high intensity lipid-lowering therapy for hyperlipidemia.    She will need medication for blood pressure as well.  No longer on diltiazem at home.  May need to start ACE inhibitor after catheterization.  Larae Grooms

## 2021-04-24 NOTE — ED Notes (Signed)
1 nitroglycerin pulled from pyxis but pt stated she is not having chest pain now and refused at this time.  This RN observed her rate change from 130 to 102 and back to 120 while appearing as sinus rhythm.  PT states she has the episodes every day and does not want to go home until she has some answers.

## 2021-04-24 NOTE — Interval H&P Note (Signed)
History and Physical Interval Note:  04/24/2021 12:28 PM  Shannon Walker  has presented today for surgery, with the diagnosis of nstemi.  The various methods of treatment have been discussed with the patient and family. After consideration of risks, benefits and other options for treatment, the patient has consented to  Procedure(s): LEFT HEART CATH AND CORONARY ANGIOGRAPHY (N/A) as a surgical intervention.  The patient's history has been reviewed, patient examined, no change in status, stable for surgery.  I have reviewed the patient's chart and labs.  Questions were answered to the patient's satisfaction.    Cath Lab Visit (complete for each Cath Lab visit)  Clinical Evaluation Leading to the Procedure:   ACS: Yes.    Non-ACS:    Anginal Classification: CCS III  Anti-ischemic medical therapy: Maximal Therapy (2 or more classes of medications)  Non-Invasive Test Results: No non-invasive testing performed  Prior CABG: No previous CABG   Lauree Chandler

## 2021-04-24 NOTE — Progress Notes (Signed)
RN attempted to call patient's daughter Jeannene Patella per the request of the patient. Left voicemail to call the unit if she has any questions.

## 2021-04-24 NOTE — ED Notes (Signed)
Bedside report given to Rogers Blocker, RN of Cath Lab

## 2021-04-24 NOTE — Consult Note (Addendum)
Cardiology Consultation:   Patient ID: Shannon Walker MRN: [id number]; DOB: [birth year]  Admit date: 04/23/2021 Date of Consult: 04/24/2021  Primary Care Provider: Mayer Camel, NP Primary Cardiologist: Dr. Bettina Gavia Primary Electrophysiologist:  None    Patient Profile:   Shannon Walker is a 87 y.o. female with a hx of HTN, carotid stenosis, hypothyroidism, HLD who is being seen today for the evaluation of chest pain at the request of emergency department.  History of Present Illness:   Ms. Cousin is an 87 year old female with a history of hypertension, hypothyroidism, hyperlipidemia who presents with progressive chest pain.  She has been having on and off typical chest pain for a while.  At her outpatient cardiology visit, the plan was to undergo CT of her coronaries as part of an outpatient evaluation, however her pain progressed and she felt that she could not wait.  Does she presented to the emergency department.  Initially in the emergency department her troponins ruled out.  However she had another episode of chest pressure with palpitations and shortness of breath that resolved with nitroglycerin.  Repeat troponin at that time was elevated to the 200s.  No acute EKG changes.  Cardiology was consulted for recommendations.  Past Medical History:  Diagnosis Date   Acquired hypothyroidism 09/16/2015   Last Assessment & Plan:  Relevant Hx: Course: Daily Update: Today's Plan:continue to follow her reviewed her last level for her  Electronically signed by: Shannon Camel, NP 11/16/15 1241   Allergic rhinitis due to pollen 11/02/2011   Aneurysm of abdominal aorta    Arthritis, midfoot 09/16/2015   Bilateral carotid artery stenosis 12/09/2012   Calculus of kidney    Chest pain 12/09/2012   Cystocele, lateral    Cystocele, midline    Cystocele, unspecified (CODE) 09/16/2015   Disorder of bone and cartilage, unspecified    Dyspnea and respiratory abnormality  12/09/2012   Edema of left foot 06/12/2017   Elevated blood pressure reading 12/09/2012   Encounter for long-term (current) use of aspirin 12/09/2012   Encounter for long-term (current) use of other medications 12/09/2012   Headache 12/09/2012   Hematuria, unspecified    HTN (hypertension) 10/05/2013   Hypertension    Left foot pain 06/12/2017   Localized osteoarthrosis not specified whether primary or secondary, unspecified site    Malaise and fatigue 12/09/2012   Last Assessment & Plan:  Relevant Hx: Course: Daily Update: Today's Plan:she is feeling her energy is returning for her of which she is really pleased with  Electronically signed by: Shannon Camel, NP 11/16/15 1247   Menopausal disorder 09/16/2015   Last Assessment & Plan:  Relevant Hx: Course: Daily Update: Today's Plan:this is stable for her and discussed UTI and relationship with lack of estrogen her UA here today was clear for her which she was pleased with  Electronically signed by: Shannon Camel, NP 11/16/15 1246   Mixed hyperlipidemia 09/16/2015   Last Assessment & Plan:  Relevant Hx: Course: Daily Update: Today's Plan:plan to see her lipids when she returns in September and update them then  Electronically signed by: Shannon Camel, NP 11/16/15 1246   Morbid obesity ([place])    Neoplasm of uncertain behavior of lip, oral cavity, 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   Neoplasm of unspecified nature of bone, soft tissue, and skin    Nonspecific abnormal cardiovascular system function study 12/09/2012   Obesity with body mass index of 30.0-39.9 09/16/2015   Last Assessment & Plan:  Relevant Hx:  Course: Daily Update: Today's Plan:she is doing the next 56 days and has lost 10 pounds of which she is very proud of as this is a lifestyle change for her and she is pleased with her efforts since she has never really been able to lose weight before and is hopeful she will be able to walk some with the weight loss  Electronically  signed by: Ruthell Rummage Brow   Occlusion and stenosis of carotid artery 12/09/2012   Occlusion and stenosis of multiple and bilateral precerebral arteries 12/09/2012   Osteopenia 09/16/2015   Last Assessment & Plan:  Relevant Hx: Course: Daily Update: Today's Plan:reviewed with her the dexa scan last done advised her of need to take calcium and vitamin d  Electronically signed by: Shannon Camel, NP 09/19/15 2042   Other abnormal glucose    Other and unspecified hyperlipidemia    Other malaise and fatigue    Other specified pre-operative examination 12/09/2012   Palpitations 10/05/2013   Pre-syncope 10/05/2013   Prediabetes 09/16/2015   Last Assessment & Plan:  Relevant Hx: Course: Daily Update: Today's Plan:her last level was stable for her at 5.9 and will continue to follow and hopefully will be lower for her with her weight loss  Electronically signed by: Shannon Camel, NP 11/16/15 1242   Routine gynecological examination    Shortness of breath 12/09/2012   Subfibular impingement of left lower extremity 06/12/2017   SVT (supraventricular tachycardia) ([place]) 05/23/2017   Symptomatic PVCs 05/23/2017   Symptoms involving cardiovascular system 12/09/2012   Unspecified functional disorder of intestine    Unspecified hypothyroidism    Unspecified menopausal and postmenopausal disorder    Urge incontinence    Vaginal enterocele, congenital or acquired    Vitamin D deficiency     Past Surgical History:  Procedure Laterality Date   CYSTOSCOPY     REPLACEMENT TOTAL KNEE       Home Medications:  Prior to Admission medications   Medication Sig Start Date End Date Taking? Authorizing Provider  ALPRAZolam Duanne Moron) 0.5 MG tablet Take 0.25 mg by mouth every 8 (eight) hours as needed for anxiety. 01/23/18  Yes [provider]  aspirin EC 81 MG tablet Take 81 mg by mouth every other day.   Yes [provider]  cyanocobalamin 100 MCG tablet Take 1 tablet by mouth  daily.   Yes [provider]  Garlic 244 MG CAPS Take 500 mg by mouth daily.   Yes [provider]  levothyroxine (SYNTHROID) 50 MCG tablet Take 50 mcg by mouth daily before breakfast.   Yes [provider]  Magnesium Gluconate 550 MG TABS Take 1 capsule by mouth daily.   Yes [provider]  metoprolol tartrate (LOPRESSOR) 25 MG tablet Take 25 mg by mouth 2 (two) times daily. 03/15/21  Yes [provider]  nitroGLYCERIN (NITROSTAT) 0.3 MG SL tablet Place 0.3 mg under the tongue as needed for chest pain. 03/20/21  Yes [provider]  Omega-3 1000 MG CAPS Take 1 tablet by mouth daily.   Yes [provider]  Vitamin D, Cholecalciferol, 1000 units CAPS Take 2,000 Units by mouth daily.    Yes [provider]  diltiazem (CARDIZEM) 30 MG tablet Take 1 tablet (30 mg total) by mouth at bedtime. Patient not taking: Reported on 04/23/2021 07/12/20   Richardo Priest, MD  diphenhydrAMINE (BENADRYL) 50 MG tablet Take 1 tablet (50 mg total) by mouth once for 1 dose. Patient not taking: Reported  on 04/23/2021 03/29/21 03/29/21  Richardo Priest, MD  isosorbide mononitrate (IMDUR) 30 MG 24 hr tablet Take 1 tablet (30 mg total) by mouth daily. Patient not taking: Reported on 04/23/2021 03/29/21 06/27/21  Richardo Priest, MD  predniSONE (DELTASONE) 50 MG tablet Take one tablet 13 hours, 7 hours, and 1 hour prior to your cardiac CT Patient not taking: Reported on 04/23/2021 03/29/21   Richardo Priest, MD    Inpatient Medications: Scheduled Meds:  aspirin  325 mg Oral Daily   hydrocortisone sod succinate (SOLU-CORTEF) inj  100 mg Intravenous Once   Continuous Infusions:  heparin 1,050 Units/hr (04/24/21 0356)   PRN Meds: nitroGLYCERIN  Allergies:    Allergies  Allergen Reactions   Bee Venom Anaphylaxis   Celecoxib Other (See Comments)    Abdominal pain    Iodine Hives   Sulfamethoxazole-Trimethoprim Other (See Comments)    Restless  legs, involuntary movements    Codeine Palpitations   Iodinated Diagnostic Agents Rash   Nabumetone Other (See Comments) and Palpitations    Tachycardia     Social History:   Social History   Socioeconomic History   Marital status: Married    Spouse name: Not on file   Number of children: Not on file   Years of education: Not on file   Highest education level: Not on file  Occupational History   Not on file  Tobacco Use   Smoking status: Never   Smokeless tobacco: Never  Vaping Use   Vaping Use: Never used  Substance and Sexual Activity   Alcohol use: No   Drug use: No   Sexual activity: Not on file  Other Topics Concern   Not on file  Social History Narrative   Not on file   Social Determinants of Health   Financial Resource Strain: Not on file  Food Insecurity: Not on file  Transportation Needs: Not on file  Physical Activity: Not on file  Stress: Not on file  Social Connections: Not on file  Intimate Partner Violence: Not on file    Family History:    Family History  Problem Relation Age of Onset   Heart attack Mother    Clotting disorder Father    Heart Problems Sister    Diabetes Sister    Diabetes Brother      Review of Systems: [y] = yes, _0  = no    General: Weight gain _1 ; Weight loss _2 ; Anorexia _3 ; Fatigue _4 ; Fever _5 ; Chills _6 ; Weakness _7   Cardiac: Chest pain/pressure _8 ; Resting SOB _9 ; Exertional SOB _10 ; Orthopnea _11 ; Pedal Edema _12 ; Palpitations _13 ; Syncope _14 ; Presyncope _15 ; Paroxysmal nocturnal dyspnea_16   Pulmonary: Cough _17 ; Wheezing_18 ; Hemoptysis_19 ; Sputum _20 ; Snoring _21   GI: Vomiting_22 ; Dysphagia_23 ; Melena_24 ; Hematochezia _25 ; Heartburn_26 ; Abdominal pain _27 ; Constipation _28 ; Diarrhea _29 ; BRBPR _30   GU: Hematuria_31 ; Dysuria _32 ; Nocturia_33   Vascular: Pain in legs with walking _34 ; Pain in feet with lying flat _35 ; Non-healing sores _36 ; Stroke _37 ; TIA _38 ; Slurred speech _39 ;  Neuro: Headaches_40 ;  Vertigo_41 ; Seizures_42 ; Paresthesias_43 ;Blurred vision _44 ; Diplopia _45 ; Vision changes _46   Ortho/Skin: Arthritis _47 ; Joint pain _48 ; Muscle pain _49 ; Joint swelling _50 ; Back Pain _51 ; Rash _52   Psych:  Depression_0 ; Anxiety_1   Heme: Bleeding problems _2 ; Clotting disorders _3 ; Anemia _4   Endocrine: Diabetes _5 ; Thyroid dysfunction_6   Physical Exam/Data:   Vitals:   04/24/21 0430 04/24/21 0445 04/24/21 0500 04/24/21 0515  BP: (!) 150/96 (!) 142/90 123/82 128/83  Pulse: 86 87 91 97  Resp: _7 Temp:      SpO2: 94% 91% 90% 92%  Weight:      Height:       No intake or output data in the 24 hours ending 04/24/21 0533 Filed Weights   04/24/21 0300  Weight: 94 kg   Body mass index is 37.9 kg/m.  General:  Well nourished, well developed, in no acute distress HEENT: normal Lymph: no adenopathy Neck: no JVD Endocrine:  No thryomegaly Vascular: No carotid bruits; FA pulses 2+ bilaterally without bruits  Cardiac:  normal S1, S2; RRR; no murmur  Lungs:  clear to auscultation bilaterally, no wheezing, rhonchi or rales  Abd: soft, nontender, no hepatomegaly  Ext: no edema Musculoskeletal:  No deformities, BUE and BLE strength normal and equal Skin: warm and dry  Neuro:  CNs 2-12 intact, no focal abnormalities noted Psych:  Normal affect   EKG:  The EKG was personally reviewed and demonstrates:  no st or t wave changes Telemetry:  Telemetry was personally reviewed and demonstrates:  sinus rhythm  Relevant CV Studies: Echo 03/15/2021: preserved EF with diastolic dysfunction. Mild-mod MR and MAC present.   Laboratory Data:  Chemistry Recent Labs  Lab 04/23/21 1054  NA 140  K 4.1  CL 106  CO2 28  GLUCOSE 106*  BUN 12  CREATININE 0.86  CALCIUM 9.2  GFRNONAA >60  ANIONGAP 6    No results for input(s): PROT, ALBUMIN, AST, ALT, ALKPHOS, BILITOT in the last 168 hours. Hematology Recent Labs  Lab 04/23/21 1054  WBC 8.7  RBC 4.67  HGB 15.6*  HCT 45.6   MCV 97.6  MCH 33.4  MCHC 34.2  RDW 13.4  PLT 267   Cardiac EnzymesNo results for input(s): TROPONINI in the last 168 hours. No results for input(s): TROPIPOC in the last 168 hours.  BNPNo results for input(s): BNP, PROBNP in the last 168 hours.  DDimer  Recent Labs  Lab 04/23/21 1303  DDIMER 1.34*    Radiology/Studies:  CT Angio Chest PE W and/or Wo Contrast  Result Date: 04/23/2021 CLINICAL DATA:  Positive D-dimer, PE suspected, low/intermediate probability. EXAM: CT ANGIOGRAPHY CHEST WITH CONTRAST TECHNIQUE: Multidetector CT imaging of the chest was performed using the standard protocol during bolus administration of intravenous contrast. Multiplanar CT image reconstructions and MIPs were obtained to evaluate the vascular anatomy. CONTRAST:  46m OMNIPAQUE IOHEXOL 350 MG/ML SOLN COMPARISON:  04/23/2021, 08/23/2014. FINDINGS: Cardiovascular: The heart is enlarged there is no pericardial effusion. Coronary artery calcifications are noted. There is calcification of the mitral valve. Atherosclerotic calcification of the aorta without evidence of aneurysm. The pulmonary trunk is distended which may be associated with underlying pulmonary artery hypertension. No pulmonary artery filling defect is identified. Evaluation of the pulmonary arteries at the right lung base is limited due to compressive atelectasis. Mediastinum/Nodes: No enlarged mediastinal, hilar, or axillary lymph nodes. Thyroid gland, trachea, and esophagus demonstrate no significant findings. Lungs/Pleura: There is elevation of the right diaphragm. Atelectasis is present at the lung bases bilaterally. No effusion or pneumothorax. Upper Abdomen: Small hiatal hernia. The gallbladder is surgically absent. There is a calcification in the left kidney measuring 4  mm. A hyperdense lesion is seen along the posterior aspect of the left kidney measuring 8 mm, possible proteinaceous or hemorrhagic cyst. Scattered diverticula are noted along the  colon. Musculoskeletal: There is compression deformity at T6 which is unchanged from 2016. Review of the MIP images confirms the above findings. IMPRESSION: 1. No evidence of pulmonary embolism or other acute process. 2. Elevation of the right diaphragm with atelectasis at the lung bases bilaterally. 3. Cardiomegaly with multi-vessel coronary artery calcifications. 4. Mildly distended pulmonary trunk which may be associated with underlying pulmonary artery hypertension. 5. Remaining findings as described above. Electronically Signed   By: Brett Fairy M.D.   On: 04/23/2021 23:39   DG Chest Port 1 View  Result Date: 04/23/2021 CLINICAL DATA:  Chest pain. EXAM: PORTABLE CHEST 1 VIEW COMPARISON:  03/13/2021 and older exams. FINDINGS: Cardiac silhouette is normal in size. No mediastinal or hilar masses. Lung volumes are low. There is bronchovascular crowding and linear lung base atelectasis. Lungs otherwise clear. No convincing pleural effusion and no pneumothorax. Skeletal structures are grossly intact. IMPRESSION: No acute cardiopulmonary disease. Electronically Signed   By: Lajean Manes M.D.   On: 04/23/2021 12:26    Assessment and Plan:   NSTEMI - load with asa and start heparin - high intensity statin  - nitroglycerin for pain - should repeat Echo prior to discharge - continue home metoprolol - npo, plan for left heart cath       For questions or updates, please contact [facility] Please consult [email or web address] for contact info under     Signed, Doyne Keel, MD  04/24/2021 5:33 AM  I have examined the patient and reviewed assessment and plan and discussed with patient.  Agree with above as stated.    The patient understands that risks include but are not limited to stroke (1 in 1000), death (1 in 64), kidney failure [usually temporary] (1 in 500), bleeding (1 in 200), allergic reaction [possibly serious] (1 in 200), and agrees to proceed.    Patient with accelerating  angina.  Last troponin was positive.  All questions about cardiac cath were answered.  She is concerned about her anxiety most of all.  She will need some anxiolytics prior to the start of the cath procedure.  She does take some antianxiety medicines at home on occasion as well.    She has had a rash with prior contrast agents.  Will need to premedicate prior to cath.  She understands why a CT angiogram is no longer indicated and is willing to proceed with cardiac cath.  Further plans based on catheterization.  She will need high intensity lipid-lowering therapy for hyperlipidemia.    She will need medication for blood pressure as well.  No longer on diltiazem at home.  May need to start ACE inhibitor after catheterization.  Larae Grooms

## 2021-04-24 NOTE — Progress Notes (Signed)
ANTICOAGULATION CONSULT NOTE - Initial Consult  Pharmacy Consult for Heparin Indication: chest pain/ACS  Allergies  Allergen Reactions   Bee Venom Anaphylaxis   Celecoxib Other (See Comments)    Abdominal pain    Iodine Hives   Sulfamethoxazole-Trimethoprim Other (See Comments)    Restless legs, involuntary movements    Codeine Palpitations   Iodinated Diagnostic Agents Rash   Nabumetone Other (See Comments) and Palpitations    Tachycardia     Patient Measurements: Height: 5\' 2"  (157.5 cm) Weight: 94 kg (207 lb 3.7 oz) IBW/kg (Calculated) : 50.1 Heparin Dosing Weight: 72 kg  Vital Signs: Temp: 98.4 F (36.9 C) (11/28 1403) Temp Source: Oral (11/28 1403) BP: 131/60 (11/28 1417) Pulse Rate: 85 (11/28 1417)  Labs: Recent Labs    04/23/21 1054 04/23/21 1303 04/24/21 0102 04/24/21 0859 04/24/21 0926  HGB 15.6*  --   --  14.5  --   HCT 45.6  --   --  43.6  --   PLT 267  --   --  266  --   HEPARINUNFRC  --   --   --   --  0.31  CREATININE 0.86  --   --  0.68  --   TROPONINIHS 7   < > 210* 475* 478*   < > = values in this interval not displayed.     Estimated Creatinine Clearance: 53.9 mL/min (by C-G formula based on SCr of 0.68 mg/dL).   Medical History: Past Medical History:  Diagnosis Date   Acquired hypothyroidism 09/16/2015   Last Assessment & Plan:  Relevant Hx: Course: Daily Update: Today's Plan:continue to follow her reviewed her last level for her  Electronically signed by: Mayer Camel, NP 11/16/15 1241   Allergic rhinitis due to pollen 11/02/2011   Aneurysm of abdominal aorta    Arthritis, midfoot 09/16/2015   Bilateral carotid artery stenosis 12/09/2012   Calculus of kidney    Chest pain 12/09/2012   Cystocele, lateral    Cystocele, midline    Cystocele, unspecified (CODE) 09/16/2015   Disorder of bone and cartilage, unspecified    Dyspnea and respiratory abnormality 12/09/2012   Edema of left foot 06/12/2017   Elevated blood pressure  reading 12/09/2012   Encounter for long-term (current) use of aspirin 12/09/2012   Encounter for long-term (current) use of other medications 12/09/2012   Headache 12/09/2012   Hematuria, unspecified    HTN (hypertension) 10/05/2013   Hypertension    Left foot pain 06/12/2017   Localized osteoarthrosis not specified whether primary or secondary, unspecified site    Malaise and fatigue 12/09/2012   Last Assessment & Plan:  Relevant Hx: Course: Daily Update: Today's Plan:she is feeling her energy is returning for her of which she is really pleased with  Electronically signed by: Mayer Camel, NP 11/16/15 1247   Menopausal disorder 09/16/2015   Last Assessment & Plan:  Relevant Hx: Course: Daily Update: Today's Plan:this is stable for her and discussed UTI and relationship with lack of estrogen her UA here today was clear for her which she was pleased with  Electronically signed by: Mayer Camel, NP 11/16/15 1246   Mixed hyperlipidemia 09/16/2015   Last Assessment & Plan:  Relevant Hx: Course: Daily Update: Today's Plan:plan to see her lipids when she returns in September and update them then  Electronically signed by: Mayer Camel, NP 11/16/15 1246   Morbid obesity ([place])    Neoplasm of uncertain behavior of lip, oral cavity, and  pharynx 11/02/2011   Neoplasm of unspecified nature of bone, soft tissue, and skin    Nonspecific abnormal cardiovascular system function study 12/09/2012   Obesity with body mass index of 30.0-39.9 09/16/2015   Last Assessment & Plan:  Relevant Hx: Course: Daily Update: Today's Plan:she is doing the next 56 days and has lost 10 pounds of which she is very proud of as this is a lifestyle change for her and she is pleased with her efforts since she has never really been able to lose weight before and is hopeful she will be able to walk some with the weight loss  Electronically signed by: Ruthell Rummage Brow   Occlusion and stenosis of carotid  artery 12/09/2012   Occlusion and stenosis of multiple and bilateral precerebral arteries 12/09/2012   Osteopenia 09/16/2015   Last Assessment & Plan:  Relevant Hx: Course: Daily Update: Today's Plan:reviewed with her the dexa scan last done advised her of need to take calcium and vitamin d  Electronically signed by: Mayer Camel, NP 09/19/15 2042   Other abnormal glucose    Other and unspecified hyperlipidemia    Other malaise and fatigue    Other specified pre-operative examination 12/09/2012   Palpitations 10/05/2013   Pre-syncope 10/05/2013   Prediabetes 09/16/2015   Last Assessment & Plan:  Relevant Hx: Course: Daily Update: Today's Plan:her last level was stable for her at 5.9 and will continue to follow and hopefully will be lower for her with her weight loss  Electronically signed by: Mayer Camel, NP 11/16/15 1242   Routine gynecological examination    Shortness of breath 12/09/2012   Subfibular impingement of left lower extremity 06/12/2017   SVT (supraventricular tachycardia) ([place]) 05/23/2017   Symptomatic PVCs 05/23/2017   Symptoms involving cardiovascular system 12/09/2012   Unspecified functional disorder of intestine    Unspecified hypothyroidism    Unspecified menopausal and postmenopausal disorder    Urge incontinence    Vaginal enterocele, congenital or acquired    Vitamin D deficiency     Medications:  See electronic med rec  Assessment: 87 y.o. F presents with CP. To begin heparin for ACS. No AC PTA. CBC ok on admission.  Pt is s/p cath>>CAD. Plan for CABG consult. Initial heparin level came back therapeutic. Plan to resume it 6 hrs post sheath removal.   Goal of Therapy:  Heparin level 0.3-0.7 units/ml Monitor platelets by anticoagulation protocol: Yes   Plan:   Resume heparin gtt at 1100 units/hr Will f/u heparin level in AM Daily heparin level and CBC  Onnie Boer, PharmD, BCIDP, AAHIVP, CPP Infectious Disease Pharmacist 04/24/2021  2:32 PM

## 2021-04-24 NOTE — Progress Notes (Signed)
PROGRESS NOTE        PATIENT DETAILS Name: Shannon Walker Age: 87 y.o. Sex: female Date of Birth: 14-May-1933 Admit Date: 04/23/2021 Admitting Physician Eben Burow, MD [id number], Ruthell Rummage, NP  Brief Narrative: Patient is a 87 y.o. female with history of HTN, hypothyroidism, HLD, stable angina pectoris-who presented with worsening chest pain-found to have non-STEMI.  Subjective: Lying comfortably in bed-denies any chest pain or shortness of breath.  Last episode of chest pain was earlier last night-for which she received sublingual nitroglycerin.  Objective: Vitals: Blood pressure (!) 141/90, pulse 79, temperature 97.7 F (36.5 C), resp. rate 19, height 5\' 2"  (1.575 m), weight 94 kg, SpO2 95 %.   Exam: Gen Exam:Alert awake-not in any distress HEENT:atraumatic, normocephalic Chest: B/L clear to auscultation anteriorly CVS:S1S2 regular Abdomen:soft non tender, non distended Extremities:no edema Neurology: Non focal Skin: no rash  Pertinent Labs/Radiology: Recent Labs  Lab 04/24/21 0859  WBC 7.8  HGB 14.5  PLT 266  NA 138  K 3.6  CREATININE 0.68   11/27> CTA chest: No PE  Assessment/Plan: Non-STEMI: Chest pain-free this morning-on antiplatelet/statin/beta-blocker/IV heparin-cardiology planning LHC later today.  Hypothyroidism: Continue Synthroid  HTN: BP stable-continue metoprolol.  Anxiety: Resume Xanax as needed.  Morbid Obesity Estimated body mass index is 37.9 kg/m as calculated from the following:   Height as of this encounter: 5\' 2"  (1.575 m).   Weight as of this encounter: 94 kg.    Procedures: None Consults: None DVT Prophylaxis: IV Heparin Code Status:Full code  Family Communication: Daughter at bedside  Time spent: 95 minutes-Greater than 50% of this time was spent in counseling, explanation of diagnosis, planning of further management, and coordination of care.   Disposition Plan: Status is:  Inpatient  Remains inpatient appropriate because: Non-STEMI-on maximal medical treatment-for LHC later today.   Diet: Diet Order             Diet NPO time specified Except for: Sips with Meds  Diet effective now                     Antimicrobial agents: Anti-infectives (From admission, onward)    None        MEDICATIONS: Scheduled Meds:  aspirin EC  81 mg Oral Daily   aspirin  325 mg Oral Daily   hydrocortisone sod succinate (SOLU-CORTEF) inj  100 mg Intravenous Once   [START ON 04/25/2021] levothyroxine  50 mcg Oral QAC breakfast   magnesium gluconate  500 mg Oral Daily   metoprolol tartrate  25 mg Oral BID   omega-3 acid ethyl esters  1,000 mg Oral Daily   Continuous Infusions:  sodium chloride     heparin 1,050 Units/hr (04/24/21 0356)   lactated ringers 75 mL/hr at 04/24/21 0942   PRN Meds:.acetaminophen **OR** acetaminophen, metoprolol tartrate, morphine injection, nitroGLYCERIN, ondansetron **OR** ondansetron (ZOFRAN) IV   I have personally reviewed following labs and imaging studies  LABORATORY DATA: CBC: Recent Labs  Lab 04/23/21 1054 04/24/21 0859  WBC 8.7 7.8  HGB 15.6* 14.5  HCT 45.6 43.6  MCV 97.6 98.0  PLT 267 025    Basic Metabolic Panel: Recent Labs  Lab 04/23/21 1054 04/24/21 0859  NA 140 138  K 4.1 3.6  CL 106 105  CO2 28 24  GLUCOSE 106* 117*  BUN 12 11  CREATININE 0.86  0.68  CALCIUM 9.2 8.7*    GFR: Estimated Creatinine Clearance: 53.9 mL/min (by C-G formula based on SCr of 0.68 mg/dL).  Liver Function Tests: No results for input(s): AST, ALT, ALKPHOS, BILITOT, PROT, ALBUMIN in the last 168 hours. No results for input(s): LIPASE, AMYLASE in the last 168 hours. No results for input(s): AMMONIA in the last 168 hours.  Coagulation Profile: No results for input(s): INR, PROTIME in the last 168 hours.  Cardiac Enzymes: No results for input(s): CKTOTAL, CKMB, CKMBINDEX, TROPONINI in the last 168 hours.  BNP (last 3  results) No results for input(s): PROBNP in the last 8760 hours.  Lipid Profile: No results for input(s): CHOL, HDL, LDLCALC, TRIG, CHOLHDL, LDLDIRECT in the last 72 hours.  Thyroid Function Tests: Recent Labs    04/23/21 1212  TSH 1.836    Anemia Panel: No results for input(s): VITAMINB12, FOLATE, FERRITIN, TIBC, IRON, RETICCTPCT in the last 72 hours.  Urine analysis: No results found for: COLORURINE, APPEARANCEUR, LABSPEC, PHURINE, GLUCOSEU, HGBUR, BILIRUBINUR, KETONESUR, PROTEINUR, UROBILINOGEN, NITRITE, LEUKOCYTESUR  Sepsis Labs: Lactic Acid, Venous No results found for: LATICACIDVEN  MICROBIOLOGY: Recent Results (from the past 240 hour(s))  Resp Panel by RT-PCR (Flu A&B, Covid) Nasopharyngeal Swab     Status: None   Collection Time: 04/24/21  3:44 AM   Specimen: Nasopharyngeal Swab; Nasopharyngeal(NP) swabs in vial transport medium  Result Value Ref Range Status   SARS Coronavirus 2 by RT PCR NEGATIVE NEGATIVE Final    Comment: (NOTE) SARS-CoV-2 target nucleic acids are NOT DETECTED.  The SARS-CoV-2 RNA is generally detectable in upper respiratory specimens during the acute phase of infection. The lowest concentration of SARS-CoV-2 viral copies this assay can detect is 138 copies/mL. A negative result does not preclude SARS-Cov-2 infection and should not be used as the sole basis for treatment or other patient management decisions. A negative result may occur with  improper specimen collection/handling, submission of specimen other than nasopharyngeal swab, presence of viral mutation(s) within the areas targeted by this assay, and inadequate number of viral copies(<138 copies/mL). A negative result must be combined with clinical observations, patient history, and epidemiological information. The expected result is Negative.  Fact Sheet for Patients:  EntrepreneurPulse.com.au  Fact Sheet for Healthcare Providers:   IncredibleEmployment.be  This test is no t yet approved or cleared by the [place] FDA and  has been authorized for detection and/or diagnosis of SARS-CoV-2 by FDA under an Emergency Use Authorization (EUA). This EUA will remain  in effect (meaning this test can be used) for the duration of the COVID-19 declaration under Section 564(b)(1) of the Act, 21 U.S.C.section 360bbb-3(b)(1), unless the authorization is terminated  or revoked sooner.       Influenza A by PCR NEGATIVE NEGATIVE Final   Influenza B by PCR NEGATIVE NEGATIVE Final    Comment: (NOTE) The Xpert Xpress SARS-CoV-2/FLU/RSV plus assay is intended as an aid in the diagnosis of influenza from Nasopharyngeal swab specimens and should not be used as a sole basis for treatment. Nasal washings and aspirates are unacceptable for Xpert Xpress SARS-CoV-2/FLU/RSV testing.  Fact Sheet for Patients: EntrepreneurPulse.com.au  Fact Sheet for Healthcare Providers: IncredibleEmployment.be  This test is not yet approved or cleared by the [place] FDA and has been authorized for detection and/or diagnosis of SARS-CoV-2 by FDA under an Emergency Use Authorization (EUA). This EUA will remain in effect (meaning this test can be used) for the duration of the COVID-19 declaration under Section 564(b)(1) of the Act,  21 U.S.C. section 360bbb-3(b)(1), unless the authorization is terminated or revoked.  Performed at [facility], [place] [address], [place], [place] [address]     RADIOLOGY STUDIES/RESULTS: CT Angio Chest PE W and/or Wo Contrast  Result Date: 04/23/2021 CLINICAL DATA:  Positive D-dimer, PE suspected, low/intermediate probability. EXAM: CT ANGIOGRAPHY CHEST WITH CONTRAST TECHNIQUE: Multidetector CT imaging of the chest was performed using the standard protocol during bolus administration of intravenous contrast. Multiplanar CT image reconstructions and  MIPs were obtained to evaluate the vascular anatomy. CONTRAST:  9mL OMNIPAQUE IOHEXOL 350 MG/ML SOLN COMPARISON:  04/23/2021, 08/23/2014. FINDINGS: Cardiovascular: The heart is enlarged there is no pericardial effusion. Coronary artery calcifications are noted. There is calcification of the mitral valve. Atherosclerotic calcification of the aorta without evidence of aneurysm. The pulmonary trunk is distended which may be associated with underlying pulmonary artery hypertension. No pulmonary artery filling defect is identified. Evaluation of the pulmonary arteries at the right lung base is limited due to compressive atelectasis. Mediastinum/Nodes: No enlarged mediastinal, hilar, or axillary lymph nodes. Thyroid gland, trachea, and esophagus demonstrate no significant findings. Lungs/Pleura: There is elevation of the right diaphragm. Atelectasis is present at the lung bases bilaterally. No effusion or pneumothorax. Upper Abdomen: Small hiatal hernia. The gallbladder is surgically absent. There is a calcification in the left kidney measuring 4 mm. A hyperdense lesion is seen along the posterior aspect of the left kidney measuring 8 mm, possible proteinaceous or hemorrhagic cyst. Scattered diverticula are noted along the colon. Musculoskeletal: There is compression deformity at T6 which is unchanged from 2016. Review of the MIP images confirms the above findings. IMPRESSION: 1. No evidence of pulmonary embolism or other acute process. 2. Elevation of the right diaphragm with atelectasis at the lung bases bilaterally. 3. Cardiomegaly with multi-vessel coronary artery calcifications. 4. Mildly distended pulmonary trunk which may be associated with underlying pulmonary artery hypertension. 5. Remaining findings as described above. Electronically Signed   By: Brett Fairy M.D.   On: 04/23/2021 23:39   DG Chest Port 1 View  Result Date: 04/23/2021 CLINICAL DATA:  Chest pain. EXAM: PORTABLE CHEST 1 VIEW COMPARISON:   03/13/2021 and older exams. FINDINGS: Cardiac silhouette is normal in size. No mediastinal or hilar masses. Lung volumes are low. There is bronchovascular crowding and linear lung base atelectasis. Lungs otherwise clear. No convincing pleural effusion and no pneumothorax. Skeletal structures are grossly intact. IMPRESSION: No acute cardiopulmonary disease. Electronically Signed   By: Lajean Manes M.D.   On: 04/23/2021 12:26     LOS: 0 days   Oren Binet, MD  [facility]    To contact the attending provider between 7A-7P or the covering provider during after hours 7P-7A, please log into the web site [email or web address] and access using universal [place] password for that web site. If you do not have the password, please call the hospital operator.  04/24/2021, 11:12 AM

## 2021-04-24 NOTE — Consult Note (Addendum)
[address]       [place] [address]             [phone]        Reniya B Bruck Ideal Medical Record #[id number] Date of Birth: 16-Feb-1933  Referring: No ref. provider found Primary Care: Mayer Camel, NP Primary Cardiologist:None  Chief Complaint:    Chief Complaint  Patient presents with   Chest Pain    History of Present Illness:    We are asked to see this 87 year old female in cardiothoracic surgical consultation for consideration of CABG.  She presented to the emergency department yesterday with chest pain, shortness of breath and palpitations.  She is multiple cardiac risk factors including hypertension, carotid stenosis, and hyperlipidemia.  On the morning prior to presentation she developed left-sided chest pressure that radiated into her left shoulder and into her left upper arm.  This was associated with shortness of breath as well as palpitations and some mild nausea.  She has had previous symptoms in the past that were similar and she has seen cardiology recently with plans for further evaluation.  She describes symptoms on a  daily basis for the past 3 weeks.  The symptoms have gradually been more severe and she felt she should go to the emergency department.  After her symptoms initially began she took 2 nitroglycerin with some good resolution of her pain but they did recur.  In the emergency department she had another episode of chest pressure with palpitations and shortness of breath lasting approximately 10 to 15 minutes prior to resolution.  She was given nitroglycerin at that time.  Her initial troponin I's were low but they did elevate on the third troponin to 210.  He did not have any acute ST segment changes on EKG.  She did have an elevated D-dimer and CT angiography of the chest was negative for pulmonary embolism.  She has been seen by cardiology in consultation and underwent cardiac catheterization on today's date.  She  is found to have severe multivessel coronary artery disease including 60% left main disease.  Please see the full report as described below.    Current Activity/ Functional Status: She is fairly active taking care of her garden but clearly limited due to multiple comorbidities, obesity and age Patient is independent with mobility/ambulation, transfers, ADL's, IADL's.   Zubrod Score: At the time of surgery this patient's most appropriate activity status/level should be described as: []     0    Normal activity, no symptoms [x]     1    Restricted in physical strenuous activity but ambulatory, able to do out light work []     2    Ambulatory and capable of self care, unable to do work activities, up and about                 more than 50%  Of the time                            []     3    Only limited self care, in bed greater than 50% of waking hours []     4    Completely disabled, no self care, confined to bed or chair []     5    Moribund  Past Medical History:  Diagnosis Date   Acquired hypothyroidism 09/16/2015   Last Assessment & Plan:  Relevant Hx:  Course: Daily Update: Today's Plan:continue to follow her reviewed her last level for her  Electronically signed by: Mayer Camel, NP 11/16/15 1241   Allergic rhinitis due to pollen 11/02/2011   Aneurysm of abdominal aorta    Arthritis, midfoot 09/16/2015   Bilateral carotid artery stenosis 12/09/2012   Calculus of kidney    Chest pain 12/09/2012   Cystocele, lateral    Cystocele, midline    Cystocele, unspecified (CODE) 09/16/2015   Disorder of bone and cartilage, unspecified    Dyspnea and respiratory abnormality 12/09/2012   Edema of left foot 06/12/2017   Elevated blood pressure reading 12/09/2012   Encounter for long-term (current) use of aspirin 12/09/2012   Encounter for long-term (current) use of other medications 12/09/2012   Headache 12/09/2012   Hematuria, unspecified    HTN (hypertension) 10/05/2013   Hypertension     Left foot pain 06/12/2017   Localized osteoarthrosis not specified whether primary or secondary, unspecified site    Malaise and fatigue 12/09/2012   Last Assessment & Plan:  Relevant Hx: Course: Daily Update: Today's Plan:she is feeling her energy is returning for her of which she is really pleased with  Electronically signed by: Mayer Camel, NP 11/16/15 1247   Menopausal disorder 09/16/2015   Last Assessment & Plan:  Relevant Hx: Course: Daily Update: Today's Plan:this is stable for her and discussed UTI and relationship with lack of estrogen her UA here today was clear for her which she was pleased with  Electronically signed by: Mayer Camel, NP 11/16/15 1246   Mixed hyperlipidemia 09/16/2015   Last Assessment & Plan:  Relevant Hx: Course: Daily Update: Today's Plan:plan to see her lipids when she returns in September and update them then  Electronically signed by: Mayer Camel, NP 11/16/15 1246   Morbid obesity ([place])    Neoplasm of uncertain behavior of lip, oral cavity, 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   Neoplasm of unspecified nature of bone, soft tissue, and skin    Nonspecific abnormal cardiovascular system function study 12/09/2012   Obesity with body mass index of 30.0-39.9 09/16/2015   Last Assessment & Plan:  Relevant Hx: Course: Daily Update: Today's Plan:she is doing the next 56 days and has lost 10 pounds of which she is very proud of as this is a lifestyle change for her and she is pleased with her efforts since she has never really been able to lose weight before and is hopeful she will be able to walk some with the weight loss  Electronically signed by: Ruthell Rummage Brow   Occlusion and stenosis of carotid artery 12/09/2012   Occlusion and stenosis of multiple and bilateral precerebral arteries 12/09/2012   Osteopenia 09/16/2015   Last Assessment & Plan:  Relevant Hx: Course: Daily Update: Today's Plan:reviewed with her the dexa scan last done advised her of  need to take calcium and vitamin d  Electronically signed by: Mayer Camel, NP 09/19/15 2042   Other abnormal glucose    Other and unspecified hyperlipidemia    Other malaise and fatigue    Other specified pre-operative examination 12/09/2012   Palpitations 10/05/2013   Pre-syncope 10/05/2013   Prediabetes 09/16/2015   Last Assessment & Plan:  Relevant Hx: Course: Daily Update: Today's Plan:her last level was stable for her at 5.9 and will continue to follow and hopefully will be lower for her with her weight loss  Electronically signed by: Mayer Camel, NP 11/16/15 1242   Routine gynecological examination  Shortness of breath 12/09/2012   Subfibular impingement of left lower extremity 06/12/2017   SVT (supraventricular tachycardia) ([place]) 05/23/2017   Symptomatic PVCs 05/23/2017   Symptoms involving cardiovascular system 12/09/2012   Unspecified functional disorder of intestine    Unspecified hypothyroidism    Unspecified menopausal and postmenopausal disorder    Urge incontinence    Vaginal enterocele, congenital or acquired    Vitamin D deficiency     Past Surgical History:  Procedure Laterality Date   CYSTOSCOPY     LEFT HEART CATH AND CORONARY ANGIOGRAPHY N/A 04/24/2021   Procedure: LEFT HEART CATH AND CORONARY ANGIOGRAPHY;  Surgeon: Burnell Blanks, MD;  Location: [facility];  Service: Cardiovascular;  Laterality: N/A;   REPLACEMENT TOTAL KNEE      Social History   Tobacco Use  Smoking Status Never  Smokeless Tobacco Never    Social History   Substance and Sexual Activity  Alcohol Use No     Allergies  Allergen Reactions   Bee Venom Anaphylaxis   Celecoxib Other (See Comments)    Abdominal pain    Iodine Hives   Sulfamethoxazole-Trimethoprim Other (See Comments)    Restless legs, involuntary movements    Codeine Palpitations   Iodinated Diagnostic Agents Rash   Nabumetone Other (See Comments) and Palpitations     Tachycardia     Current Facility-Administered Medications  Medication Dose Route Frequency Provider Last Rate Last Admin   0.9 %  sodium chloride infusion   Intravenous Continuous Burnell Blanks, MD 75 mL/hr at 04/24/21 1322 New Bag at 04/24/21 1322   0.9 %  sodium chloride infusion  250 mL Intravenous PRN Burnell Blanks, MD       acetaminophen (TYLENOL) tablet 650 mg  650 mg Oral Q6H PRN Burnell Blanks, MD       Or   acetaminophen (TYLENOL) suppository 650 mg  650 mg Rectal Q6H PRN Burnell Blanks, MD       ALPRAZolam Duanne Moron) tablet 0.25 mg  0.25 mg Oral Q8H PRN Burnell Blanks, MD       aspirin EC tablet 81 mg  81 mg Oral Daily Burnell Blanks, MD       heparin ADULT infusion 100 units/mL (25000 units/233mL)  1,100 Units/hr Intravenous Continuous Pham, Minh Q, RPH-CPP       hydrALAZINE (APRESOLINE) injection 10 mg  10 mg Intravenous Q20 Min PRN Burnell Blanks, MD       hydrocortisone sodium succinate (SOLU-CORTEF) 100 MG injection 100 mg  100 mg Intravenous Once Burnell Blanks, MD       labetalol (NORMODYNE) injection 10 mg  10 mg Intravenous Q10 min PRN Burnell Blanks, MD       lactated ringers infusion   Intravenous Continuous Burnell Blanks, MD   Stopped at 04/24/21 1253   [START ON 04/25/2021] levothyroxine (SYNTHROID) tablet 50 mcg  50 mcg Oral QAC breakfast Burnell Blanks, MD       magnesium gluconate (MAGONATE) tablet 500 mg  500 mg Oral Daily Lauree Chandler D, MD   500 mg at 04/24/21 0931   metoprolol tartrate (LOPRESSOR) injection 5 mg  5 mg Intravenous Q6H PRN Burnell Blanks, MD       metoprolol tartrate (LOPRESSOR) tablet 25 mg  25 mg Oral BID Burnell Blanks, MD   25 mg at 04/24/21 0930   morphine 2 MG/ML injection 2 mg  2 mg Intravenous Q3H PRN Burnell Blanks, MD  nitroGLYCERIN (NITROSTAT) SL tablet 0.4 mg  0.4 mg Sublingual Q5 min PRN Burnell Blanks, MD   0.4 mg at 04/24/21 0152   omega-3 acid ethyl esters (LOVAZA) capsule 1,000 mg  1,000 mg Oral Daily Burnell Blanks, MD   1,000 mg at 04/24/21 0931   ondansetron (ZOFRAN) tablet 4 mg  4 mg Oral Q6H PRN Burnell Blanks, MD       Or   ondansetron [facility]) injection 4 mg  4 mg Intravenous Q6H PRN Burnell Blanks, MD       sodium chloride flush (NS) 0.9 % injection 3 mL  3 mL Intravenous Q12H Burnell Blanks, MD       sodium chloride flush (NS) 0.9 % injection 3 mL  3 mL Intravenous PRN Burnell Blanks, MD        Medications Prior to Admission  Medication Sig Dispense Refill Last Dose   ALPRAZolam (XANAX) 0.5 MG tablet Take 0.25 mg by mouth every 8 (eight) hours as needed for anxiety.   unknown   aspirin EC 81 MG tablet Take 81 mg by mouth every other day.   Past Week   cyanocobalamin 100 MCG tablet Take 1 tablet by mouth daily.   16/02/9603   Garlic 540 MG CAPS Take 500 mg by mouth daily.   04/22/2021   levothyroxine (SYNTHROID) 50 MCG tablet Take 50 mcg by mouth daily before breakfast.   04/23/2021   Magnesium Gluconate 550 MG TABS Take 1 capsule by mouth daily.   04/22/2021   metoprolol tartrate (LOPRESSOR) 25 MG tablet Take 25 mg by mouth 2 (two) times daily.   04/23/2021 at 0800   nitroGLYCERIN (NITROSTAT) 0.3 MG SL tablet Place 0.3 mg under the tongue as needed for chest pain.   04/23/2021 at 0400   Omega-3 1000 MG CAPS Take 1 tablet by mouth daily.   04/22/2021   Vitamin D, Cholecalciferol, 1000 units CAPS Take 2,000 Units by mouth daily.    04/22/2021   diltiazem (CARDIZEM) 30 MG tablet Take 1 tablet (30 mg total) by mouth at bedtime. (Patient not taking: Reported on 04/23/2021) 90 tablet 3 Not Taking   diphenhydrAMINE (BENADRYL) 50 MG tablet Take 1 tablet (50 mg total) by mouth once for 1 dose. (Patient not taking: Reported on 04/23/2021) 1 tablet 0 Not Taking   isosorbide mononitrate (IMDUR) 30 MG 24 hr tablet Take 1 tablet (30  mg total) by mouth daily. (Patient not taking: Reported on 04/23/2021) 90 tablet 3 Not Taking   predniSONE (DELTASONE) 50 MG tablet Take one tablet 13 hours, 7 hours, and 1 hour prior to your cardiac CT (Patient not taking: Reported on 04/23/2021) 3 tablet 0 Not Taking    Family History  Problem Relation Age of Onset   Heart attack Mother    Clotting disorder Father    Heart Problems Sister    Diabetes Sister    Diabetes Brother      Review of Systems:   Review of Systems  Constitutional:  Positive for diaphoresis and malaise/fatigue. Negative for chills, fever and weight loss.  HENT:  Positive for hearing loss. Negative for congestion, ear discharge, ear pain, nosebleeds, sinus pain and tinnitus.   Eyes:        Right eye discomfort  Respiratory:  Positive for shortness of breath. Negative for cough, hemoptysis, sputum production, wheezing and stridor.   Cardiovascular:  Positive for chest pain, palpitations and leg swelling. Negative for orthopnea, claudication and PND.  Gastrointestinal:  Positive for constipation, diarrhea, heartburn and nausea. Negative for abdominal pain, blood in stool, melena and vomiting.  Genitourinary: Negative.   Musculoskeletal:  Positive for back pain, joint pain and myalgias. Negative for falls.  Skin: Negative.   Neurological:  Negative for dizziness, tingling, tremors, sensory change, speech change, focal weakness, seizures, loss of consciousness, weakness and headaches.  Endo/Heme/Allergies:  Negative for environmental allergies and polydipsia. Does not bruise/bleed easily.  Psychiatric/Behavioral:  Negative for depression, hallucinations, memory loss, substance abuse and suicidal ideas. The patient is nervous/anxious and has insomnia.        Physical Exam: BP (!) 147/82   Pulse (!) 105   Temp 98.4 F (36.9 C) (Oral)   Resp 18   Ht 5\' 2"  (1.575 m)   Wt 94 kg   SpO2 93%   BMI 37.90 kg/m    General appearance: alert, cooperative, appears  stated age, and no distress Head: Normocephalic, without obvious abnormality, atraumatic Neck: no adenopathy, no carotid bruit, no JVD, supple, symmetrical, trachea midline, and thyroid not enlarged, symmetric, no tenderness/mass/nodules Lymph nodes: Cervical, supraclavicular, and axillary nodes normal. Resp: clear to auscultation bilaterally Back: Lumbar tenderness to palpation Cardio: regular rate and rhythm and aortic systolic murmur 2/6 GI: Moderate obesity, nontender no palpable masses Extremities: DP/PT weak, positive spider veins, previous bilateral knee replacement Neurologic: Grossly normal  Diagnostic Studies & Laboratory data:     Recent Radiology Findings:   CT Angio Chest PE W and/or Wo Contrast  Result Date: 04/23/2021 CLINICAL DATA:  Positive D-dimer, PE suspected, low/intermediate probability. EXAM: CT ANGIOGRAPHY CHEST WITH CONTRAST TECHNIQUE: Multidetector CT imaging of the chest was performed using the standard protocol during bolus administration of intravenous contrast. Multiplanar CT image reconstructions and MIPs were obtained to evaluate the vascular anatomy. CONTRAST:  19mL OMNIPAQUE IOHEXOL 350 MG/ML SOLN COMPARISON:  04/23/2021, 08/23/2014. FINDINGS: Cardiovascular: The heart is enlarged there is no pericardial effusion. Coronary artery calcifications are noted. There is calcification of the mitral valve. Atherosclerotic calcification of the aorta without evidence of aneurysm. The pulmonary trunk is distended which may be associated with underlying pulmonary artery hypertension. No pulmonary artery filling defect is identified. Evaluation of the pulmonary arteries at the right lung base is limited due to compressive atelectasis. Mediastinum/Nodes: No enlarged mediastinal, hilar, or axillary lymph nodes. Thyroid gland, trachea, and esophagus demonstrate no significant findings. Lungs/Pleura: There is elevation of the right diaphragm. Atelectasis is present at the lung bases  bilaterally. No effusion or pneumothorax. Upper Abdomen: Small hiatal hernia. The gallbladder is surgically absent. There is a calcification in the left kidney measuring 4 mm. A hyperdense lesion is seen along the posterior aspect of the left kidney measuring 8 mm, possible proteinaceous or hemorrhagic cyst. Scattered diverticula are noted along the colon. Musculoskeletal: There is compression deformity at T6 which is unchanged from 2016. Review of the MIP images confirms the above findings. IMPRESSION: 1. No evidence of pulmonary embolism or other acute process. 2. Elevation of the right diaphragm with atelectasis at the lung bases bilaterally. 3. Cardiomegaly with multi-vessel coronary artery calcifications. 4. Mildly distended pulmonary trunk which may be associated with underlying pulmonary artery hypertension. 5. Remaining findings as described above. Electronically Signed   By: Brett Fairy M.D.   On: 04/23/2021 23:39   CARDIAC CATHETERIZATION  Result Date: 04/24/2021   Mid RCA lesion is 99% stenosed.   Prox RCA lesion is 40% stenosed.   Ost LM to Dist LM lesion is 60% stenosed.   Dist LM to  Prox LAD lesion is 60% stenosed.   Mid LAD lesion is 99% stenosed.   1st Diag lesion is 60% stenosed. Moderate to severe distal left main stenosis Moderate, heavily calcified ostial LAD stenosis. Severe heavily calcified mid LAD stenosis just past the takeoff of the diagonal branch. The diagonal branch has diffuse moderate stenosis. Mild non-obstructive disease in the Circumflex. The RCA is a large dominant vessel with heavy calcification in the proximal and mid vessel. The mid RCA has a severe, heavily calcified stenosis that is a functional CTO. The distal RCA branches fill from antegrade flow and from left to right collaterals. LVEDP 7 mmHg Recommendations: She has severe distal left main disease and severe, calcific disease in the RCA and LAD. Her disease would be best treated with CABG as I am not sure we would  have a favorable result with PCI. She will be a high risk candidate for CABG given her age but overall she seems relatively functional. PCI will not be a good option for revascularization. Will ask CT surgery to see her for CABG. Resume IV heparin 8 hours post sheath pull. Continue ASA, statin and beta blocker.   DG Chest Port 1 View  Result Date: 04/23/2021 CLINICAL DATA:  Chest pain. EXAM: PORTABLE CHEST 1 VIEW COMPARISON:  03/13/2021 and older exams. FINDINGS: Cardiac silhouette is normal in size. No mediastinal or hilar masses. Lung volumes are low. There is bronchovascular crowding and linear lung base atelectasis. Lungs otherwise clear. No convincing pleural effusion and no pneumothorax. Skeletal structures are grossly intact. IMPRESSION: No acute cardiopulmonary disease. Electronically Signed   By: Lajean Manes M.D.   On: 04/23/2021 12:26     I have independently reviewed the above radiologic studies and discussed with the patient   Recent Lab Findings: Lab Results  Component Value Date   WBC 7.8 04/24/2021   HGB 14.5 04/24/2021   HCT 43.6 04/24/2021   PLT 266 04/24/2021   GLUCOSE 117 (H) 04/24/2021   ALT 26 06/30/2014   AST 27 06/30/2014   NA 138 04/24/2021   K 3.6 04/24/2021   CL 105 04/24/2021   CREATININE 0.68 04/24/2021   BUN 11 04/24/2021   CO2 24 04/24/2021   TSH 1.836 04/23/2021      Assessment / Plan: Left main and severe multivessel coronary artery disease in the setting of accelerating angina and non-STEMI. Multiple cardiac risk factors including hypertension, morbid obesity, carotid artery disease, borderline diabetes, and hyperlipidemia. Multiple medical comorbidities as described in the history.   Plan: The patient would clearly be high risk for coronary artery bypass grafting.  The surgeon will evaluate the patient and relevant studies.   I  spent 40 minutes counseling the patient face to face.   John Giovanni, PA-C  04/24/2021 4:48 PM  Cornie Mccomber  is a 87 year old woman with a history of obesity, hyperlipidemia, hypertension, carotid stenosis, hypothyroidism, supraventricular tachycardia, abdominal aneurysm, numerous other issues.  She presents with about a 3-week history of chest pain.  She ruled in for a non-ST elevation MI.  Catheterization she has three-vessel coronary disease and a 60% left main stenosis.  She is a high risk surgical candidate with an estimated mortality of 8% and major morbidity on the order of 25%.  She has a high likelihood of not being able to return to her prior functional status should she undergo CABG.  I discussed possible coronary bypass grafting with Mrs. Grabert and her daughter.  I informed them of the high risk  nature of the procedure.  I informed them of the indications, risks, benefits, and alternatives.  They understand the risks include, but are not limited to death, MI, DVT, PE, stroke, bleeding, transfusion, infection, cardiac arrhythmias, respiratory or renal failure, failure to thrive, as well as the possibility of other unforeseeable complications.  Will discuss with cardiology  Remo Lipps C. Roxan Hockey, MD [facility] [phone]

## 2021-04-24 NOTE — ED Provider Notes (Signed)
Patient signed out to me with CT angiography pending.  Patient went for CT and upon return was noted to be very hypertensive, tachycardic and complaining of recurrent chest pain.  I did review her records and it appears that cardiology feels that she is likely having typical angina and were performing an outpatient work-up.  Initial plan was to continue with outpatient work-up if CT angiography was negative.  Patient treated with nitroglycerin for her recurrent chest pain and elevated blood pressures.  She reports that symptoms have improved and her repeat blood pressures are now normotensive.  I did add a third troponin based on her symptoms and this one is grossly elevated at 210.  Discussed with Dr. Marcelle Smiling, call for cardiology.  Cardiology will consult on the patient, will admit to hospitalist service.   Orpah Greek, MD 04/24/21 [phone]

## 2021-04-24 NOTE — Progress Notes (Signed)
TCTS consulted for CABG evaluation. °

## 2021-04-24 NOTE — Progress Notes (Signed)
RN rounded on the patient to start taking air out of the TR band after ECHO was performed. RN noticed new drainage and RN has marked the new drainage. RN has clean around the site with alcohol pads and educated the patient to keep the R wrist as straight and still as possible. RN elevated R wrist with pillow and administered 2cc back into the TR band to the total of 11cc. Call bell is within reach.

## 2021-04-24 NOTE — Progress Notes (Signed)
ANTICOAGULATION CONSULT NOTE - Initial Consult  Pharmacy Consult for Heparin Indication: chest pain/ACS  Allergies  Allergen Reactions   Bee Venom Anaphylaxis   Celecoxib Other (See Comments)    Abdominal pain    Iodine Hives   Sulfamethoxazole-Trimethoprim Other (See Comments)    Restless legs, involuntary movements    Codeine Palpitations   Iodinated Diagnostic Agents Rash   Nabumetone Other (See Comments) and Palpitations    Tachycardia     Patient Measurements: Height: 5\' 2"  (157.5 cm) Weight: 94 kg (207 lb 3.7 oz) IBW/kg (Calculated) : 50.1 Heparin Dosing Weight: 72 kg  Vital Signs: BP: 124/87 (11/28 0215) Pulse Rate: 98 (11/28 0220)  Labs: Recent Labs    04/23/21 1054 04/23/21 1303 04/24/21 0102  HGB 15.6*  --   --   HCT 45.6  --   --   PLT 267  --   --   CREATININE 0.86  --   --   TROPONINIHS 7 7 210*    Estimated Creatinine Clearance: 50.2 mL/min (by C-G formula based on SCr of 0.86 mg/dL).   Medical History: Past Medical History:  Diagnosis Date   Acquired hypothyroidism 09/16/2015   Last Assessment & Plan:  Relevant Hx: Course: Daily Update: Today's Plan:continue to follow her reviewed her last level for her  Electronically signed by: Mayer Camel, NP 11/16/15 1241   Allergic rhinitis due to pollen 11/02/2011   Aneurysm of abdominal aorta    Arthritis, midfoot 09/16/2015   Bilateral carotid artery stenosis 12/09/2012   Calculus of kidney    Chest pain 12/09/2012   Cystocele, lateral    Cystocele, midline    Cystocele, unspecified (CODE) 09/16/2015   Disorder of bone and cartilage, unspecified    Dyspnea and respiratory abnormality 12/09/2012   Edema of left foot 06/12/2017   Elevated blood pressure reading 12/09/2012   Encounter for long-term (current) use of aspirin 12/09/2012   Encounter for long-term (current) use of other medications 12/09/2012   Headache 12/09/2012   Hematuria, unspecified    HTN (hypertension) 10/05/2013    Hypertension    Left foot pain 06/12/2017   Localized osteoarthrosis not specified whether primary or secondary, unspecified site    Malaise and fatigue 12/09/2012   Last Assessment & Plan:  Relevant Hx: Course: Daily Update: Today's Plan:she is feeling her energy is returning for her of which she is really pleased with  Electronically signed by: Mayer Camel, NP 11/16/15 1247   Menopausal disorder 09/16/2015   Last Assessment & Plan:  Relevant Hx: Course: Daily Update: Today's Plan:this is stable for her and discussed UTI and relationship with lack of estrogen her UA here today was clear for her which she was pleased with  Electronically signed by: Mayer Camel, NP 11/16/15 1246   Mixed hyperlipidemia 09/16/2015   Last Assessment & Plan:  Relevant Hx: Course: Daily Update: Today's Plan:plan to see her lipids when she returns in September and update them then  Electronically signed by: Mayer Camel, NP 11/16/15 1246   Morbid obesity ([place])    Neoplasm of uncertain behavior of lip, oral cavity, 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   Neoplasm of unspecified nature of bone, soft tissue, and skin    Nonspecific abnormal cardiovascular system function study 12/09/2012   Obesity with body mass index of 30.0-39.9 09/16/2015   Last Assessment & Plan:  Relevant Hx: Course: Daily Update: Today's Plan:she is doing the next 56 days and has lost 10 pounds of which she is very  proud of as this is a lifestyle change for her and she is pleased with her efforts since she has never really been able to lose weight before and is hopeful she will be able to walk some with the weight loss  Electronically signed by: Ruthell Rummage Brow   Occlusion and stenosis of carotid artery 12/09/2012   Occlusion and stenosis of multiple and bilateral precerebral arteries 12/09/2012   Osteopenia 09/16/2015   Last Assessment & Plan:  Relevant Hx: Course: Daily Update: Today's Plan:reviewed with her the dexa scan last  done advised her of need to take calcium and vitamin d  Electronically signed by: Mayer Camel, NP 09/19/15 2042   Other abnormal glucose    Other and unspecified hyperlipidemia    Other malaise and fatigue    Other specified pre-operative examination 12/09/2012   Palpitations 10/05/2013   Pre-syncope 10/05/2013   Prediabetes 09/16/2015   Last Assessment & Plan:  Relevant Hx: Course: Daily Update: Today's Plan:her last level was stable for her at 5.9 and will continue to follow and hopefully will be lower for her with her weight loss  Electronically signed by: Mayer Camel, NP 11/16/15 1242   Routine gynecological examination    Shortness of breath 12/09/2012   Subfibular impingement of left lower extremity 06/12/2017   SVT (supraventricular tachycardia) ([place]) 05/23/2017   Symptomatic PVCs 05/23/2017   Symptoms involving cardiovascular system 12/09/2012   Unspecified functional disorder of intestine    Unspecified hypothyroidism    Unspecified menopausal and postmenopausal disorder    Urge incontinence    Vaginal enterocele, congenital or acquired    Vitamin D deficiency     Medications:  See electronic med rec  Assessment: 87 y.o. F presents with CP. To begin heparin for ACS. No AC PTA. CBC ok on admission.  Goal of Therapy:  Heparin level 0.3-0.7 units/ml Monitor platelets by anticoagulation protocol: Yes   Plan:  Heparin IV bolus 4000 units Heparin gtt at 1050 units/hr Will f/u heparin level in 8 hours Daily heparin level and CBC  Sherlon Handing, PharmD, BCPS Please see amion for complete clinical pharmacist phone list 04/24/2021,3:39 AM

## 2021-04-24 NOTE — Plan of Care (Signed)
  Problem: Education: Goal: Knowledge of General Education information will improve Description: Including pain rating scale, medication(s)/side effects and non-pharmacologic comfort measures Outcome: Progressing   Problem: Clinical Measurements: Goal: Ability to maintain clinical measurements within normal limits will improve Outcome: Progressing Goal: Will remain free from infection Outcome: Progressing   

## 2021-04-24 NOTE — Progress Notes (Signed)
  Echocardiogram 2D Echocardiogram has been performed.  Shannon Walker M 04/24/2021, 3:16 PM

## 2021-04-24 NOTE — Progress Notes (Signed)
RN called cath lab to assess intermittent bleeding from TR band when attempting to release air. RN reported that whenever attempting to take air from TR band the band would bleed and air would have to be added back. TR band assessed and noted to have old coagulated blood under the band. TR band cleaned and 2 CC air removed. Stayed at bedside and monitored site for approx 15 minutes, no new bleeding present after releasing air and monitoring. Witnessed patient attempting to use arm multiple times while with patient. Patient coached on the importance of not using her right arm. RN Beverlee Nims assessed TR band following air removal.

## 2021-04-25 ENCOUNTER — Other Ambulatory Visit (HOSPITAL_COMMUNITY): Payer: Medicare Other

## 2021-04-25 DIAGNOSIS — I714 Abdominal aortic aneurysm, without rupture, unspecified: Secondary | ICD-10-CM | POA: Diagnosis not present

## 2021-04-25 DIAGNOSIS — I10 Essential (primary) hypertension: Secondary | ICD-10-CM | POA: Diagnosis not present

## 2021-04-25 DIAGNOSIS — I214 Non-ST elevation (NSTEMI) myocardial infarction: Secondary | ICD-10-CM | POA: Diagnosis not present

## 2021-04-25 LAB — CBC
HCT: 39.6 % (ref 36.0–46.0)
Hemoglobin: 13.4 g/dL (ref 12.0–15.0)
MCH: 32.5 pg (ref 26.0–34.0)
MCHC: 33.8 g/dL (ref 30.0–36.0)
MCV: 96.1 fL (ref 80.0–100.0)
Platelets: 296 10*3/uL (ref 150–400)
RBC: 4.12 MIL/uL (ref 3.87–5.11)
RDW: 13.5 % (ref 11.5–15.5)
WBC: 12.7 10*3/uL — ABNORMAL HIGH (ref 4.0–10.5)
nRBC: 0 % (ref 0.0–0.2)

## 2021-04-25 LAB — HEPARIN LEVEL (UNFRACTIONATED)
Heparin Unfractionated: 0.15 IU/mL — ABNORMAL LOW (ref 0.30–0.70)
Heparin Unfractionated: 0.37 IU/mL (ref 0.30–0.70)

## 2021-04-25 LAB — BASIC METABOLIC PANEL
Anion gap: 10 (ref 5–15)
BUN: 15 mg/dL (ref 8–23)
CO2: 20 mmol/L — ABNORMAL LOW (ref 22–32)
Calcium: 8.8 mg/dL — ABNORMAL LOW (ref 8.9–10.3)
Chloride: 109 mmol/L (ref 98–111)
Creatinine, Ser: 0.73 mg/dL (ref 0.44–1.00)
GFR, Estimated: >60 mL/min (ref >60)
Glucose, Bld: 129 mg/dL — ABNORMAL HIGH (ref 70–99)
Potassium: 4.2 mmol/L (ref 3.5–5.1)
Sodium: 139 mmol/L (ref 135–145)

## 2021-04-25 NOTE — Progress Notes (Addendum)
Progress Note  Patient Name: Shannon Walker Date of Encounter: 04/25/2021  [place] Cardiologist: None Dr. Bettina Gavia  Subjective   No chest pain.  She did speak with [facility] from [place] surgery.  She had the feeling that they would recommend against cardiac surgery due to the longer recovery time.  Inpatient Medications    Scheduled Meds:  aspirin EC  81 mg Oral Daily   levothyroxine  50 mcg Oral QAC breakfast   magnesium gluconate  500 mg Oral Daily   metoprolol tartrate  25 mg Oral BID   omega-3 acid ethyl esters  1,000 mg Oral Daily   sodium chloride flush  3 mL Intravenous Q12H   Continuous Infusions:  sodium chloride     heparin 1,250 Units/hr (04/25/21 0611)   lactated ringers Stopped (04/24/21 1253)   PRN Meds: sodium chloride, acetaminophen **OR** acetaminophen, ALPRAZolam, metoprolol tartrate, morphine injection, nitroGLYCERIN, ondansetron **OR** ondansetron (ZOFRAN) IV, sodium chloride flush   Vital Signs    Vitals:   04/25/21 0005 04/25/21 0318 04/25/21 0827 04/25/21 1102  BP: (!) 106/51 (!) 141/73 139/77 135/65  Pulse: 79 97 88 62  Resp: 17 19  18   Temp: (!) 97.3 F (36.3 C) 98.3 F (36.8 C) 98.4 F (36.9 C) 98.1 F (36.7 C)  TempSrc: Oral Oral Oral Oral  SpO2: 93% 94% 97% 98%  Weight:  92.7 kg    Height:        Intake/Output Summary (Last 24 hours) at 04/25/2021 1113 Last data filed at 04/25/2021 0854 Gross per 24 hour  Intake 1000.51 ml  Output 600 ml  Net 400.51 ml   Last 3 Weights 04/25/2021 04/24/2021 03/29/2021  Weight (lbs) 204 lb 6.4 oz 207 lb 3.7 oz 207 lb 9.6 oz  Weight (kg) 92.715 kg 94 kg 94.167 kg      Telemetry    Normal sinus rhythm- Personally Reviewed  ECG    Physical Exam   GEN: No acute distress.   Neck: No JVD Cardiac: RRR, no murmurs, rubs, or gallops.  Respiratory: Clear to auscultation bilaterally. GI: Soft, nontender, non-distended  MS: No edema; No deformity. Neuro:  Nonfocal  Psych: Normal affect    Labs    High Sensitivity Troponin:   Recent Labs  Lab 04/23/21 1054 04/23/21 1303 04/24/21 0102 04/24/21 0859 04/24/21 0926  TROPONINIHS 7 7 210* 475* 478*     Chemistry Recent Labs  Lab 04/23/21 1054 04/24/21 0859 04/25/21 0442  NA 140 138 139  K 4.1 3.6 4.2  CL 106 105 109  CO2 28 24 20*  GLUCOSE 106* 117* 129*  BUN 12 11 15   CREATININE 0.86 0.68 0.73  CALCIUM 9.2 8.7* 8.8*  GFRNONAA >60 >60 >60  ANIONGAP 6 9 10     Lipids No results for input(s): CHOL, TRIG, HDL, LABVLDL, LDLCALC, CHOLHDL in the last 168 hours.  Hematology Recent Labs  Lab 04/23/21 1054 04/24/21 0859 04/25/21 0442  WBC 8.7 7.8 12.7*  RBC 4.67 4.45 4.12  HGB 15.6* 14.5 13.4  HCT 45.6 43.6 39.6  MCV 97.6 98.0 96.1  MCH 33.4 32.6 32.5  MCHC 34.2 33.3 33.8  RDW 13.4 13.4 13.5  PLT 267 266 296   Thyroid  Recent Labs  Lab 04/23/21 1212  TSH 1.836    BNPNo results for input(s): BNP, PROBNP in the last 168 hours.  DDimer  Recent Labs  Lab 04/23/21 1303  DDIMER 1.34*     Radiology    CT Angio Chest PE W and/or  Wo Contrast  Result Date: 04/23/2021 CLINICAL DATA:  Positive D-dimer, PE suspected, low/intermediate probability. EXAM: CT ANGIOGRAPHY CHEST WITH CONTRAST TECHNIQUE: Multidetector CT imaging of the chest was performed using the standard protocol during bolus administration of intravenous contrast. Multiplanar CT image reconstructions and MIPs were obtained to evaluate the vascular anatomy. CONTRAST:  69mL OMNIPAQUE IOHEXOL 350 MG/ML SOLN COMPARISON:  04/23/2021, 08/23/2014. FINDINGS: Cardiovascular: The heart is enlarged there is no pericardial effusion. Coronary artery calcifications are noted. There is calcification of the mitral valve. Atherosclerotic calcification of the aorta without evidence of aneurysm. The pulmonary trunk is distended which may be associated with underlying pulmonary artery hypertension. No pulmonary artery filling defect is identified. Evaluation of the  pulmonary arteries at the right lung base is limited due to compressive atelectasis. Mediastinum/Nodes: No enlarged mediastinal, hilar, or axillary lymph nodes. Thyroid gland, trachea, and esophagus demonstrate no significant findings. Lungs/Pleura: There is elevation of the right diaphragm. Atelectasis is present at the lung bases bilaterally. No effusion or pneumothorax. Upper Abdomen: Small hiatal hernia. The gallbladder is surgically absent. There is a calcification in the left kidney measuring 4 mm. A hyperdense lesion is seen along the posterior aspect of the left kidney measuring 8 mm, possible proteinaceous or hemorrhagic cyst. Scattered diverticula are noted along the colon. Musculoskeletal: There is compression deformity at T6 which is unchanged from 2016. Review of the MIP images confirms the above findings. IMPRESSION: 1. No evidence of pulmonary embolism or other acute process. 2. Elevation of the right diaphragm with atelectasis at the lung bases bilaterally. 3. Cardiomegaly with multi-vessel coronary artery calcifications. 4. Mildly distended pulmonary trunk which may be associated with underlying pulmonary artery hypertension. 5. Remaining findings as described above. Electronically Signed   By: Brett Fairy M.D.   On: 04/23/2021 23:39   CARDIAC CATHETERIZATION  Result Date: 04/24/2021   Mid RCA lesion is 99% stenosed.   Prox RCA lesion is 40% stenosed.   Ost LM to Dist LM lesion is 60% stenosed.   Dist LM to Prox LAD lesion is 60% stenosed.   Mid LAD lesion is 99% stenosed.   1st Diag lesion is 60% stenosed. Moderate to severe distal left main stenosis Moderate, heavily calcified ostial LAD stenosis. Severe heavily calcified mid LAD stenosis just past the takeoff of the diagonal branch. The diagonal branch has diffuse moderate stenosis. Mild non-obstructive disease in the Circumflex. The RCA is a large dominant vessel with heavy calcification in the proximal and mid vessel. The mid RCA has a  severe, heavily calcified stenosis that is a functional CTO. The distal RCA branches fill from antegrade flow and from left to right collaterals. LVEDP 7 mmHg Recommendations: She has severe distal left main disease and severe, calcific disease in the RCA and LAD. Her disease would be best treated with CABG as I am not sure we would have a favorable result with PCI. She will be a high risk candidate for CABG given her age but overall she seems relatively functional. PCI will not be a good option for revascularization. Will ask CT surgery to see her for CABG. Resume IV heparin 8 hours post sheath pull. Continue ASA, statin and beta blocker.   DG Chest Port 1 View  Result Date: 04/23/2021 CLINICAL DATA:  Chest pain. EXAM: PORTABLE CHEST 1 VIEW COMPARISON:  03/13/2021 and older exams. FINDINGS: Cardiac silhouette is normal in size. No mediastinal or hilar masses. Lung volumes are low. There is bronchovascular crowding and linear lung base atelectasis. Lungs otherwise  clear. No convincing pleural effusion and no pneumothorax. Skeletal structures are grossly intact. IMPRESSION: No acute cardiopulmonary disease. Electronically Signed   By: Lajean Manes M.D.   On: 04/23/2021 12:26   ECHOCARDIOGRAM COMPLETE  Result Date: 04/24/2021    ECHOCARDIOGRAM REPORT   Patient Name:   Shannon Walker Date of Exam: 04/24/2021 Medical Rec #:  [id number]        Height:       62.0 in Accession #:    [id number]       Weight:       207.2 lb Date of Birth:  Sep 20, 1932        BSA:          1.941 m Patient Age:    25 years         BP:           131/60 mmHg Patient Gender: F                HR:           92 bpm. Exam Location:  Inpatient Procedure: 2D Echo, Cardiac Doppler, Color Doppler, Strain Analysis and 3D Echo Indications:    CAD Native Vessel I25.10  History:        Patient has prior history of Echocardiogram examinations, most                 recent 03/15/2021. Carotid Disease; Risk Factors:Hypertension                 and  Dyslipidemia. Hypothyroidism.  Sonographer:    Darlina Sicilian RDCS Referring Phys: [place]  1. Global longitudinal strain is -15.2%. Left ventricular ejection fraction, by estimation, is 65 to 70%. The left ventricle has normal function. The left ventricle has no regional wall motion abnormalities. There is mild left ventricular hypertrophy. Left ventricular diastolic parameters are indeterminate.  2. Right ventricular systolic function is normal. The right ventricular size is normal.  3. Trivial mitral valve regurgitation. Moderate mitral annular calcification.  4. Aortic valve regurgitation is not visualized. Aortic valve sclerosis is present, with no evidence of aortic valve stenosis.  5. The inferior vena cava is normal in size with greater than 50% respiratory variability, suggesting right atrial pressure of 3 mmHg. FINDINGS  Left Ventricle: Global longitudinal strain is -15.2%. Left ventricular ejection fraction, by estimation, is 65 to 70%. The left ventricle has normal function. The left ventricle has no regional wall motion abnormalities. The left ventricular internal cavity size was normal in size. There is mild left ventricular hypertrophy. Left ventricular diastolic parameters are indeterminate. Right Ventricle: The right ventricular size is normal. Right vetricular wall thickness was not assessed. Right ventricular systolic function is normal. Left Atrium: Left atrial size was normal in size. Right Atrium: Right atrial size was normal in size. Pericardium: There is no evidence of pericardial effusion. Mitral Valve: There is mild thickening of the mitral valve leaflet(s). Moderate mitral annular calcification. Trivial mitral valve regurgitation. MV peak gradient, 11.1 mmHg. The mean mitral valve gradient is 4.0 mmHg. Tricuspid Valve: The tricuspid valve is normal in structure. Tricuspid valve regurgitation is trivial. Aortic Valve: Aortic valve regurgitation is not  visualized. Aortic valve sclerosis is present, with no evidence of aortic valve stenosis. Pulmonic Valve: The pulmonic valve was normal in structure. Pulmonic valve regurgitation is not visualized. Aorta: The aortic root and ascending aorta are structurally normal, with no evidence of dilitation. Venous: The inferior vena cava is normal in  size with greater than 50% respiratory variability, suggesting right atrial pressure of 3 mmHg.  LEFT VENTRICLE PLAX 2D LVIDd:         4.30 cm   Diastology LVIDs:         2.77 cm   LV e' medial:    4.38 cm/s LV PW:         1.20 cm   LV E/e' medial:  16.6 LV IVS:        1.30 cm   LV e' lateral:   6.66 cm/s LVOT diam:     1.90 cm   LV E/e' lateral: 10.9 LV SV:         50 LV SV Index:   26 LVOT Area:     2.84 cm                           3D Volume EF:                          3D EF:        54 %                          LV EDV:       76 ml                          LV ESV:       35 ml                          LV SV:        41 ml RIGHT VENTRICLE RV S prime:     11.20 cm/s LEFT ATRIUM             Index LA diam:        3.50 cm 1.80 cm/m LA Vol (A2C):   21.9 ml 11.28 ml/m LA Vol (A4C):   25.0 ml 12.88 ml/m LA Biplane Vol: 23.7 ml 12.21 ml/m  AORTIC VALVE LVOT Vmax:   98.70 cm/s LVOT Vmean:  55.400 cm/s LVOT VTI:    0.176 m  AORTA Ao Root diam: 3.20 cm Ao Asc diam:  3.30 cm MITRAL VALVE MV Area (PHT): 4.29 cm     SHUNTS MV Area VTI:   2.02 cm     Systemic VTI:  0.18 m MV Peak grad:  11.1 mmHg    Systemic Diam: 1.90 cm MV Mean grad:  4.0 mmHg MV Vmax:       1.66 m/s MV Vmean:      90.4 cm/s MV Decel Time: 177 msec MV E velocity: 72.60 cm/s MV A velocity: 162.00 cm/s MV E/A ratio:  0.45 Dorris Carnes MD Electronically signed by Dorris Carnes MD Signature Date/Time: 04/24/2021/6:09:20 PM    Final     Cardiac Studies   Cath films personally reviewed, severe three-vessel disease  Patient Profile     87 y.o. female with NSTEMI and complex CAD  Assessment & Plan    CAD/NSTEMI: Await  final word from CT surgery.  If she was not a candidate for bypass surgery or she declined it, I think interventional options are limited.  The only potential interventional target is these subtotally occluded RCA which is heavily calcified.  I did explain atherectomy to the patient.  I explained the risks and potential benefits.  She is unsure whether she would choose the high risk PCI option or medical therapy if surgery was not an option.  We also spoke in general about coronary artery disease.  I explained that this is a process that is ongoing and has been building for many years.  She was upset that she had been told she had a normal cholesterol at her last check, and could not understand how these blockages happened.  Will start isosorbide 30 mg daily.  We will start rosuvastatin 20 mg daily as part of medical therapy which she could continue regardless what the decision about revascularization is.  Known AAA, from atherosclerosis would be indication for statin.   HTN: COntinue to monitor.      For questions or updates, please contact [place] Please consult [email or web address] for contact info under        Signed, Larae Grooms, MD  04/25/2021, 11:13 AM

## 2021-04-25 NOTE — Progress Notes (Signed)
ANTICOAGULATION CONSULT NOTE   Pharmacy Consult for Heparin Indication: chest pain/ACS  Allergies  Allergen Reactions   Bee Venom Anaphylaxis   Celecoxib Other (See Comments)    Abdominal pain    Iodine Hives   Sulfamethoxazole-Trimethoprim Other (See Comments)    Restless legs, involuntary movements    Codeine Palpitations   Iodinated Diagnostic Agents Rash   Nabumetone Other (See Comments) and Palpitations    Tachycardia     Patient Measurements: Height: 5\' 2"  (157.5 cm) Weight: 92.7 kg (204 lb 6.4 oz) IBW/kg (Calculated) : 50.1 Heparin Dosing Weight: 72 kg  Vital Signs: Temp: 98.1 F (36.7 C) (11/29 1102) Temp Source: Oral (11/29 1102) BP: 135/65 (11/29 1102) Pulse Rate: 62 (11/29 1102)  Labs: Recent Labs    04/23/21 1054 04/23/21 1303 04/24/21 0102 04/24/21 0859 04/24/21 0926 04/25/21 0442 04/25/21 1433  HGB 15.6*  --   --  14.5  --  13.4  --   HCT 45.6  --   --  43.6  --  39.6  --   PLT 267  --   --  266  --  296  --   HEPARINUNFRC  --   --   --   --  0.31 0.15* 0.37  CREATININE 0.86  --   --  0.68  --  0.73  --   TROPONINIHS 7   < > 210* 475* 478*  --   --    < > = values in this interval not displayed.     Estimated Creatinine Clearance: 53.5 mL/min (by C-G formula based on SCr of 0.73 mg/dL). Assessment: 87 y.o. F presents with CP. To begin heparin for ACS. No AC PTA. CBC ok on admission.  Pt is s/p cath>>CAD. Plan for CABG consult. Heparin resumed post sheath.  Heparin level therapeutic (0.37) on gtt at 1250 units/hr. No issues with line reported per RN.   Goal of Therapy:  Heparin level 0.3-0.7 units/ml Monitor platelets by anticoagulation protocol: Yes   Plan:  Continue heparin gtt at 1250 units/hr F/u heparin level and CBC with am labs.  Alanda Slim, PharmD, [facility] Clinical Pharmacist Please see AMION for all Pharmacists' Contact Phone Numbers 04/25/2021, 3:22 PM

## 2021-04-25 NOTE — Progress Notes (Signed)
PROGRESS NOTE        PATIENT DETAILS Name: Shannon Walker Age: 87 y.o. Sex: female Date of Birth: 19-Jun-1932 Admit Date: 04/23/2021 Admitting Physician Eben Burow, MD SJG:[id number], Ruthell Rummage, NP  Brief Narrative: Patient is a 87 y.o. female with history of HTN, hypothyroidism, HLD, stable angina pectoris-who presented with worsening chest pain-found to have non-STEMI.  Subjective: Had some mild chest pain overnight.  Chest pain-free this morning.  Objective: Vitals: Blood pressure 135/65, pulse 62, temperature 98.1 F (36.7 C), temperature source Oral, resp. rate 18, height 5\' 2"  (1.575 m), weight 92.7 kg, SpO2 98 %.   Exam: Gen Exam:Alert awake-not in any distress HEENT:atraumatic, normocephalic Chest: B/L clear to auscultation anteriorly CVS:S1S2 regular Abdomen:soft non tender, non distended Extremities:no edema Neurology: Non focal Skin: no rash   Pertinent Labs/Radiology: Recent Labs  Lab 04/25/21 0442  WBC 12.7*  HGB 13.4  PLT 296  NA 139  K 4.2  CREATININE 0.73    11/27> CTA chest: No PE  Assessment/Plan: Non-STEMI: Continue IV heparin/statin/aspirin/beta-blocker-LHC on 11/28 revealed severe three-vessel disease.  Ongoing discussions with cardiology and cardiothoracic surgery regarding further plan of care.  Will await recommendations.  Hypothyroidism: Continue Synthroid  HTN: BP stable-continue metoprolol.  Anxiety: Continue Xanax as needed.  Morbid Obesity Estimated body mass index is 37.39 kg/m as calculated from the following:   Height as of this encounter: 5\' 2"  (1.575 m).   Weight as of this encounter: 92.7 kg.    Procedures: None Consults: None DVT Prophylaxis: IV Heparin Code Status:Full code  Family Communication: None at bedside. Time spent: 25 minutes-Greater than 50% of this time was spent in counseling, explanation of diagnosis, planning of further management, and coordination of  care.   Disposition Plan: Status is: Inpatient  Remains inpatient appropriate because: Non-STEMI-on maximal medical treatment-for LHC later today.   Diet: Diet Order             Diet Heart Room service appropriate? Yes; Fluid consistency: Thin  Diet effective now                     Antimicrobial agents: Anti-infectives (From admission, onward)    None        MEDICATIONS: Scheduled Meds:  aspirin EC  81 mg Oral Daily   levothyroxine  50 mcg Oral QAC breakfast   magnesium gluconate  500 mg Oral Daily   metoprolol tartrate  25 mg Oral BID   omega-3 acid ethyl esters  1,000 mg Oral Daily   sodium chloride flush  3 mL Intravenous Q12H   Continuous Infusions:  sodium chloride     heparin 1,250 Units/hr (04/25/21 0611)   lactated ringers Stopped (04/24/21 1253)   PRN Meds:.sodium chloride, acetaminophen **OR** acetaminophen, ALPRAZolam, metoprolol tartrate, morphine injection, nitroGLYCERIN, ondansetron **OR** ondansetron (ZOFRAN) IV, sodium chloride flush   I have personally reviewed following labs and imaging studies  LABORATORY DATA: CBC: Recent Labs  Lab 04/23/21 1054 04/24/21 0859 04/25/21 0442  WBC 8.7 7.8 12.7*  HGB 15.6* 14.5 13.4  HCT 45.6 43.6 39.6  MCV 97.6 98.0 96.1  PLT 267 266 296     Basic Metabolic Panel: Recent Labs  Lab 04/23/21 1054 04/24/21 0859 04/25/21 0442  NA 140 138 139  K 4.1 3.6 4.2  CL 106 105 109  CO2 28 24 20*  GLUCOSE  106* 117* 129*  BUN 12 11 15   CREATININE 0.86 0.68 0.73  CALCIUM 9.2 8.7* 8.8*     GFR: Estimated Creatinine Clearance: 53.5 mL/min (by C-G formula based on SCr of 0.73 mg/dL).  Liver Function Tests: No results for input(s): AST, ALT, ALKPHOS, BILITOT, PROT, ALBUMIN in the last 168 hours. No results for input(s): LIPASE, AMYLASE in the last 168 hours. No results for input(s): AMMONIA in the last 168 hours.  Coagulation Profile: No results for input(s): INR, PROTIME in the last 168  hours.  Cardiac Enzymes: No results for input(s): CKTOTAL, CKMB, CKMBINDEX, TROPONINI in the last 168 hours.  BNP (last 3 results) No results for input(s): PROBNP in the last 8760 hours.  Lipid Profile: No results for input(s): CHOL, HDL, LDLCALC, TRIG, CHOLHDL, LDLDIRECT in the last 72 hours.  Thyroid Function Tests: Recent Labs    04/23/21 1212  TSH 1.836     Anemia Panel: No results for input(s): VITAMINB12, FOLATE, FERRITIN, TIBC, IRON, RETICCTPCT in the last 72 hours.  Urine analysis: No results found for: COLORURINE, APPEARANCEUR, LABSPEC, PHURINE, GLUCOSEU, HGBUR, BILIRUBINUR, KETONESUR, PROTEINUR, UROBILINOGEN, NITRITE, LEUKOCYTESUR  Sepsis Labs: Lactic Acid, Venous No results found for: LATICACIDVEN  MICROBIOLOGY: Recent Results (from the past 240 hour(s))  Resp Panel by RT-PCR (Flu A&B, Covid) Nasopharyngeal Swab     Status: None   Collection Time: 04/24/21  3:44 AM   Specimen: Nasopharyngeal Swab; Nasopharyngeal(NP) swabs in vial transport medium  Result Value Ref Range Status   SARS Coronavirus 2 by RT PCR NEGATIVE NEGATIVE Final    Comment: (NOTE) SARS-CoV-2 target nucleic acids are NOT DETECTED.  The SARS-CoV-2 RNA is generally detectable in upper respiratory specimens during the acute phase of infection. The lowest concentration of SARS-CoV-2 viral copies this assay can detect is 138 copies/mL. A negative result does not preclude SARS-Cov-2 infection and should not be used as the sole basis for treatment or other patient management decisions. A negative result may occur with  improper specimen collection/handling, submission of specimen other than nasopharyngeal swab, presence of viral mutation(s) within the areas targeted by this assay, and inadequate number of viral copies(<138 copies/mL). A negative result must be combined with clinical observations, patient history, and epidemiological information. The expected result is Negative.  Fact Sheet for  Patients:  EntrepreneurPulse.com.au  Fact Sheet for Healthcare Providers:  IncredibleEmployment.be  This test is no t yet approved or cleared by the [place] FDA and  has been authorized for detection and/or diagnosis of SARS-CoV-2 by FDA under an Emergency Use Authorization (EUA). This EUA will remain  in effect (meaning this test can be used) for the duration of the COVID-19 declaration under Section 564(b)(1) of the Act, 21 U.S.C.section 360bbb-3(b)(1), unless the authorization is terminated  or revoked sooner.       Influenza A by PCR NEGATIVE NEGATIVE Final   Influenza B by PCR NEGATIVE NEGATIVE Final    Comment: (NOTE) The Xpert Xpress SARS-CoV-2/FLU/RSV plus assay is intended as an aid in the diagnosis of influenza from Nasopharyngeal swab specimens and should not be used as a sole basis for treatment. Nasal washings and aspirates are unacceptable for Xpert Xpress SARS-CoV-2/FLU/RSV testing.  Fact Sheet for Patients: EntrepreneurPulse.com.au  Fact Sheet for Healthcare Providers: IncredibleEmployment.be  This test is not yet approved or cleared by the [place] FDA and has been authorized for detection and/or diagnosis of SARS-CoV-2 by FDA under an Emergency Use Authorization (EUA). This EUA will remain in effect (meaning this test can  be used) for the duration of the COVID-19 declaration under Section 564(b)(1) of the Act, 21 U.S.C. section 360bbb-3(b)(1), unless the authorization is terminated or revoked.  Performed at [facility], [place] [address], [place], [place] [address]     RADIOLOGY STUDIES/RESULTS: CT Angio Chest PE W and/or Wo Contrast  Result Date: 04/23/2021 CLINICAL DATA:  Positive D-dimer, PE suspected, low/intermediate probability. EXAM: CT ANGIOGRAPHY CHEST WITH CONTRAST TECHNIQUE: Multidetector CT imaging of the chest was performed using the standard protocol  during bolus administration of intravenous contrast. Multiplanar CT image reconstructions and MIPs were obtained to evaluate the vascular anatomy. CONTRAST:  69mL OMNIPAQUE IOHEXOL 350 MG/ML SOLN COMPARISON:  04/23/2021, 08/23/2014. FINDINGS: Cardiovascular: The heart is enlarged there is no pericardial effusion. Coronary artery calcifications are noted. There is calcification of the mitral valve. Atherosclerotic calcification of the aorta without evidence of aneurysm. The pulmonary trunk is distended which may be associated with underlying pulmonary artery hypertension. No pulmonary artery filling defect is identified. Evaluation of the pulmonary arteries at the right lung base is limited due to compressive atelectasis. Mediastinum/Nodes: No enlarged mediastinal, hilar, or axillary lymph nodes. Thyroid gland, trachea, and esophagus demonstrate no significant findings. Lungs/Pleura: There is elevation of the right diaphragm. Atelectasis is present at the lung bases bilaterally. No effusion or pneumothorax. Upper Abdomen: Small hiatal hernia. The gallbladder is surgically absent. There is a calcification in the left kidney measuring 4 mm. A hyperdense lesion is seen along the posterior aspect of the left kidney measuring 8 mm, possible proteinaceous or hemorrhagic cyst. Scattered diverticula are noted along the colon. Musculoskeletal: There is compression deformity at T6 which is unchanged from 2016. Review of the MIP images confirms the above findings. IMPRESSION: 1. No evidence of pulmonary embolism or other acute process. 2. Elevation of the right diaphragm with atelectasis at the lung bases bilaterally. 3. Cardiomegaly with multi-vessel coronary artery calcifications. 4. Mildly distended pulmonary trunk which may be associated with underlying pulmonary artery hypertension. 5. Remaining findings as described above. Electronically Signed   By: Brett Fairy M.D.   On: 04/23/2021 23:39   CARDIAC  CATHETERIZATION  Result Date: 04/24/2021   Mid RCA lesion is 99% stenosed.   Prox RCA lesion is 40% stenosed.   Ost LM to Dist LM lesion is 60% stenosed.   Dist LM to Prox LAD lesion is 60% stenosed.   Mid LAD lesion is 99% stenosed.   1st Diag lesion is 60% stenosed. Moderate to severe distal left main stenosis Moderate, heavily calcified ostial LAD stenosis. Severe heavily calcified mid LAD stenosis just past the takeoff of the diagonal branch. The diagonal branch has diffuse moderate stenosis. Mild non-obstructive disease in the Circumflex. The RCA is a large dominant vessel with heavy calcification in the proximal and mid vessel. The mid RCA has a severe, heavily calcified stenosis that is a functional CTO. The distal RCA branches fill from antegrade flow and from left to right collaterals. LVEDP 7 mmHg Recommendations: She has severe distal left main disease and severe, calcific disease in the RCA and LAD. Her disease would be best treated with CABG as I am not sure we would have a favorable result with PCI. She will be a high risk candidate for CABG given her age but overall she seems relatively functional. PCI will not be a good option for revascularization. Will ask CT surgery to see her for CABG. Resume IV heparin 8 hours post sheath pull. Continue ASA, statin and beta blocker.  ECHOCARDIOGRAM COMPLETE  Result Date: 04/24/2021    ECHOCARDIOGRAM REPORT   Patient Name:   Shannon Walker Date of Exam: 04/24/2021 Medical Rec #:  [id number]        Height:       62.0 in Accession #:    [id number]       Weight:       207.2 lb Date of Birth:  [birth year]        BSA:          1.941 m Patient Age:    55 years         BP:           131/60 mmHg Patient Gender: F                HR:           92 bpm. Exam Location:  Inpatient Procedure: 2D Echo, Cardiac Doppler, Color Doppler, Strain Analysis and 3D Echo Indications:    CAD Native Vessel I25.10  History:        Patient has prior history of Echocardiogram  examinations, most                 recent 03/15/2021. Carotid Disease; Risk Factors:Hypertension                 and Dyslipidemia. Hypothyroidism.  Sonographer:    Darlina Sicilian RDCS Referring Phys: [place]  1. Global longitudinal strain is -15.2%. Left ventricular ejection fraction, by estimation, is 65 to 70%. The left ventricle has normal function. The left ventricle has no regional wall motion abnormalities. There is mild left ventricular hypertrophy. Left ventricular diastolic parameters are indeterminate.  2. Right ventricular systolic function is normal. The right ventricular size is normal.  3. Trivial mitral valve regurgitation. Moderate mitral annular calcification.  4. Aortic valve regurgitation is not visualized. Aortic valve sclerosis is present, with no evidence of aortic valve stenosis.  5. The inferior vena cava is normal in size with greater than 50% respiratory variability, suggesting right atrial pressure of 3 mmHg. FINDINGS  Left Ventricle: Global longitudinal strain is -15.2%. Left ventricular ejection fraction, by estimation, is 65 to 70%. The left ventricle has normal function. The left ventricle has no regional wall motion abnormalities. The left ventricular internal cavity size was normal in size. There is mild left ventricular hypertrophy. Left ventricular diastolic parameters are indeterminate. Right Ventricle: The right ventricular size is normal. Right vetricular wall thickness was not assessed. Right ventricular systolic function is normal. Left Atrium: Left atrial size was normal in size. Right Atrium: Right atrial size was normal in size. Pericardium: There is no evidence of pericardial effusion. Mitral Valve: There is mild thickening of the mitral valve leaflet(s). Moderate mitral annular calcification. Trivial mitral valve regurgitation. MV peak gradient, 11.1 mmHg. The mean mitral valve gradient is 4.0 mmHg. Tricuspid Valve: The tricuspid valve is  normal in structure. Tricuspid valve regurgitation is trivial. Aortic Valve: Aortic valve regurgitation is not visualized. Aortic valve sclerosis is present, with no evidence of aortic valve stenosis. Pulmonic Valve: The pulmonic valve was normal in structure. Pulmonic valve regurgitation is not visualized. Aorta: The aortic root and ascending aorta are structurally normal, with no evidence of dilitation. Venous: The inferior vena cava is normal in size with greater than 50% respiratory variability, suggesting right atrial pressure of 3 mmHg.  LEFT VENTRICLE PLAX 2D LVIDd:         4.30 cm   Diastology LVIDs:  2.77 cm   LV e' medial:    4.38 cm/s LV PW:         1.20 cm   LV E/e' medial:  16.6 LV IVS:        1.30 cm   LV e' lateral:   6.66 cm/s LVOT diam:     1.90 cm   LV E/e' lateral: 10.9 LV SV:         50 LV SV Index:   26 LVOT Area:     2.84 cm                           3D Volume EF:                          3D EF:        54 %                          LV EDV:       76 ml                          LV ESV:       35 ml                          LV SV:        41 ml RIGHT VENTRICLE RV S prime:     11.20 cm/s LEFT ATRIUM             Index LA diam:        3.50 cm 1.80 cm/m LA Vol (A2C):   21.9 ml 11.28 ml/m LA Vol (A4C):   25.0 ml 12.88 ml/m LA Biplane Vol: 23.7 ml 12.21 ml/m  AORTIC VALVE LVOT Vmax:   98.70 cm/s LVOT Vmean:  55.400 cm/s LVOT VTI:    0.176 m  AORTA Ao Root diam: 3.20 cm Ao Asc diam:  3.30 cm MITRAL VALVE MV Area (PHT): 4.29 cm     SHUNTS MV Area VTI:   2.02 cm     Systemic VTI:  0.18 m MV Peak grad:  11.1 mmHg    Systemic Diam: 1.90 cm MV Mean grad:  4.0 mmHg MV Vmax:       1.66 m/s MV Vmean:      90.4 cm/s MV Decel Time: 177 msec MV E velocity: 72.60 cm/s MV A velocity: 162.00 cm/s MV E/A ratio:  0.45 Dorris Carnes MD Electronically signed by Dorris Carnes MD Signature Date/Time: 04/24/2021/6:09:20 PM    Final      LOS: 1 day   Oren Binet, MD  [facility]    To contact the  attending provider between 7A-7P or the covering provider during after hours 7P-7A, please log into the web site [email or web address] and access using universal [place] password for that web site. If you do not have the password, please call the hospital operator.  04/25/2021, 1:32 PM

## 2021-04-25 NOTE — TOC Progression Note (Signed)
Transition of Care [facility]) - Progression Note    Patient Details  Name: Shannon Walker MRN: [id number] Date of Birth: Sep 16, 1932  [facility] CM/SW Contact  Zenon Mayo, RN Phone Number: 04/25/2021, 4:21 PM  Clinical Narrative:    from home, NSTEMI, s/p cath, MV CAD, TCT consult for CABG, hep drip. TOC will continue to follow for dc needs.        Expected Discharge Plan and Services                                                 Social Determinants of Health (SDOH) Interventions    Readmission Risk Interventions No flowsheet data found.

## 2021-04-25 NOTE — Progress Notes (Signed)
ANTICOAGULATION CONSULT NOTE   Pharmacy Consult for Heparin Indication: chest pain/ACS  Allergies  Allergen Reactions   Bee Venom Anaphylaxis   Celecoxib Other (See Comments)    Abdominal pain    Iodine Hives   Sulfamethoxazole-Trimethoprim Other (See Comments)    Restless legs, involuntary movements    Codeine Palpitations   Iodinated Diagnostic Agents Rash   Nabumetone Other (See Comments) and Palpitations    Tachycardia     Patient Measurements: Height: 5\' 2"  (157.5 cm) Weight: 92.7 kg (204 lb 6.4 oz) IBW/kg (Calculated) : 50.1 Heparin Dosing Weight: 72 kg  Vital Signs: Temp: 98.3 F (36.8 C) (11/29 0318) Temp Source: Oral (11/29 0318) BP: 141/73 (11/29 0318) Pulse Rate: 97 (11/29 0318)  Labs: Recent Labs    04/23/21 1054 04/23/21 1303 04/24/21 0102 04/24/21 0859 04/24/21 0926 04/25/21 0442  HGB 15.6*  --   --  14.5  --   --   HCT 45.6  --   --  43.6  --   --   PLT 267  --   --  266  --   --   HEPARINUNFRC  --   --   --   --  0.31 0.15*  CREATININE 0.86  --   --  0.68  --  0.73  TROPONINIHS 7   < > 210* 475* 478*  --    < > = values in this interval not displayed.     Estimated Creatinine Clearance: 53.5 mL/min (by C-G formula based on SCr of 0.73 mg/dL). Assessment: 87 y.o. F presents with CP. To begin heparin for ACS. No AC PTA. CBC ok on admission.  Pt is s/p cath>>CAD. Plan for CABG consult. Heparin resumed post sheath. Heparin level subtherapeutic (0.15) on gtt at 1100 units/hr. Level drawn only about 4 hours post gtt restart. No issues with line reported per RN. RN reports a small blood spot on TR band dressing.  Goal of Therapy:  Heparin level 0.3-0.7 units/ml Monitor platelets by anticoagulation protocol: Yes   Plan:  Increase heparin gtt to 1250 units/hr F/u 8 hr heparin level  Sherlon Handing, PharmD, BCPS Please see amion for complete clinical pharmacist phone list 04/25/2021 6:03 AM

## 2021-04-26 ENCOUNTER — Other Ambulatory Visit (HOSPITAL_COMMUNITY): Payer: Medicare Other

## 2021-04-26 DIAGNOSIS — I10 Essential (primary) hypertension: Secondary | ICD-10-CM | POA: Diagnosis not present

## 2021-04-26 DIAGNOSIS — I214 Non-ST elevation (NSTEMI) myocardial infarction: Secondary | ICD-10-CM | POA: Diagnosis not present

## 2021-04-26 DIAGNOSIS — I2510 Atherosclerotic heart disease of native coronary artery without angina pectoris: Secondary | ICD-10-CM

## 2021-04-26 DIAGNOSIS — E782 Mixed hyperlipidemia: Secondary | ICD-10-CM | POA: Diagnosis not present

## 2021-04-26 DIAGNOSIS — E669 Obesity, unspecified: Secondary | ICD-10-CM

## 2021-04-26 LAB — CBC
HCT: 47.1 % — ABNORMAL HIGH (ref 36.0–46.0)
Hemoglobin: 16 g/dL — ABNORMAL HIGH (ref 12.0–15.0)
MCH: 33.3 pg (ref 26.0–34.0)
MCHC: 34 g/dL (ref 30.0–36.0)
MCV: 98.1 fL (ref 80.0–100.0)
Platelets: 272 10*3/uL (ref 150–400)
RBC: 4.8 MIL/uL (ref 3.87–5.11)
RDW: 13.3 % (ref 11.5–15.5)
WBC: 8.9 10*3/uL (ref 4.0–10.5)
nRBC: 0 % (ref 0.0–0.2)

## 2021-04-26 LAB — BASIC METABOLIC PANEL
Anion gap: 9 (ref 5–15)
BUN: 16 mg/dL (ref 8–23)
CO2: 25 mmol/L (ref 22–32)
Calcium: 9.1 mg/dL (ref 8.9–10.3)
Chloride: 104 mmol/L (ref 98–111)
Creatinine, Ser: 0.77 mg/dL (ref 0.44–1.00)
GFR, Estimated: >60 mL/min (ref >60)
Glucose, Bld: 114 mg/dL — ABNORMAL HIGH (ref 70–99)
Potassium: 4.3 mmol/L (ref 3.5–5.1)
Sodium: 138 mmol/L (ref 135–145)

## 2021-04-26 LAB — HEPARIN LEVEL (UNFRACTIONATED)
Heparin Unfractionated: 0.23 IU/mL — ABNORMAL LOW (ref 0.30–0.70)
Heparin Unfractionated: 0.42 IU/mL (ref 0.30–0.70)

## 2021-04-26 LAB — LIPID PANEL
Cholesterol: 245 mg/dL — ABNORMAL HIGH (ref 0–200)
HDL: 50 mg/dL (ref >40)
LDL Cholesterol: 165 mg/dL — ABNORMAL HIGH (ref 0–99)
Total CHOL/HDL Ratio: 4.9 RATIO
Triglycerides: 150 mg/dL — ABNORMAL HIGH (ref <150)
VLDL: 30 mg/dL (ref 0–40)

## 2021-04-26 MED ORDER — ROSUVASTATIN CALCIUM 20 MG PO TABS
20.0000 mg | ORAL_TABLET | Freq: Every day | ORAL | Status: DC
  Administered 2021-04-26 – 2021-04-27 (×2): 20 mg via ORAL
  Filled 2021-04-26 (×2): qty 1

## 2021-04-26 MED ORDER — ISOSORBIDE MONONITRATE ER 30 MG PO TB24
30.0000 mg | ORAL_TABLET | Freq: Every day | ORAL | Status: DC
  Administered 2021-04-26 – 2021-04-27 (×2): 30 mg via ORAL
  Filled 2021-04-26 (×2): qty 1

## 2021-04-26 NOTE — Progress Notes (Signed)
ANTICOAGULATION CONSULT NOTE   Pharmacy Consult for Heparin Indication: chest pain/ACS  Allergies  Allergen Reactions   Bee Venom Anaphylaxis   Celecoxib Other (See Comments)    Abdominal pain    Iodine Hives   Sulfamethoxazole-Trimethoprim Other (See Comments)    Restless legs, involuntary movements    Codeine Palpitations   Iodinated Diagnostic Agents Rash   Nabumetone Other (See Comments) and Palpitations    Tachycardia     Patient Measurements: Height: 5\' 2"  (157.5 cm) Weight: 92.3 kg (203 lb 8 oz) IBW/kg (Calculated) : 50.1 Heparin Dosing Weight: 72 kg  Vital Signs: Temp: 98.2 F (36.8 C) (11/30 0342) Temp Source: Oral (11/30 0342) BP: 130/58 (11/30 0914) Pulse Rate: 84 (11/30 0914)  Labs: Recent Labs     0000 04/24/21 0102 04/24/21 0859 04/24/21 0926 04/24/21 0926 04/25/21 0442 04/25/21 1433 04/26/21 0338 04/26/21 1303  HGB   < >  --  14.5  --   --  13.4  --  16.0*  --   HCT  --   --  43.6  --   --  39.6  --  47.1*  --   PLT  --   --  266  --   --  296  --  272  --   HEPARINUNFRC  --   --   --  0.31   < > 0.15* 0.37 0.23* 0.42  CREATININE  --   --  0.68  --   --  0.73  --  0.77  --   TROPONINIHS  --  210* 475* 478*  --   --   --   --   --    < > = values in this interval not displayed.     Estimated Creatinine Clearance: 53.4 mL/min (by C-G formula based on SCr of 0.77 mg/dL).  Assessment: 87 y.o. F presents with CP. To begin heparin for ACS. No AC PTA. CBC ok on admission.  Pt is s/p cath>>CAD. Plan for CABG consult. Heparin therapeutic.  Goal of Therapy:  Heparin level 0.3-0.7 units/ml Monitor platelets by anticoagulation protocol: Yes   Plan:  Continue heparin 1400 units/hr Daily heparin level and CBC  Arrie Senate, PharmD, [place], [facility] Clinical Pharmacist [phone] Please check AMION for all [place] numbers 04/26/2021

## 2021-04-26 NOTE — Progress Notes (Signed)
Mobility Specialist Progress Note:   04/26/21 1259  Mobility  Activity Ambulated in hall  Level of Assistance Modified independent, requires aide device or extra time  Assistive Device Front wheel walker  Distance Ambulated (ft) 400 ft  Mobility Ambulated with assistance in hallway  Mobility Response Tolerated well  Mobility performed by Mobility specialist  $Mobility charge 1 Mobility   Pt received in bed willing to participate in mobility. No complaints of pain. Pt stated she was a little SOB upon returning to room. Left in bed with call bell in reach, all needs met, and family present.   [facility] Public librarian Phone [phone] Secondary Phone [phone]

## 2021-04-26 NOTE — Plan of Care (Signed)

## 2021-04-26 NOTE — Progress Notes (Deleted)
Mobility Specialist Progress Note:   04/26/21 1058  Mobility  Activity Ambulated in hall  Level of Assistance Standby assist, set-up cues, supervision of patient - no hands on  Assistive Device Front wheel walker  Distance Ambulated (ft) 60 ft  Mobility Ambulated with assistance in hallway  Mobility Response Tolerated well  Mobility performed by Mobility specialist  Bed Position Chair  $Mobility charge 1 Mobility   Pt received in bed willing to participate in mobility. No complaints of pain. Pt returned to chair at sink to get washed up. Call bell in reach and all needs met.  [facility] Public librarian Phone [phone] Secondary Phone [phone]

## 2021-04-26 NOTE — Progress Notes (Signed)
2 Days Post-Op Procedure(s) (LRB): LEFT HEART CATH AND CORONARY ANGIOGRAPHY (N/A) Subjective: No complaints, walked twice today  Objective: Vital signs in last 24 hours: Temp:  [97.5 F (36.4 C)-98.2 F (36.8 C)] 98.2 F (36.8 C) (11/30 1638) Pulse Rate:  [68-84] 70 (11/30 1638) Cardiac Rhythm: Normal sinus rhythm (11/30 0711) Resp:  [16-18] 18 (11/30 1638) BP: (114-162)/(58-83) 114/64 (11/30 1638) SpO2:  [93 %-97 %] 93 % (11/30 1638) Weight:  [92.3 kg] 92.3 kg (11/30 0342)  Hemodynamic parameters for last 24 hours:    Intake/Output from previous day: 11/29 0701 - 11/30 0700 In: 594 [P.O.:594] Out: 2100 [Urine:2100] Intake/Output this shift: Total I/O In: 598 [P.O.:598] Out: 200 [Urine:200]  General appearance: alert, cooperative, and no distress Heart: regular rate and rhythm  Lab Results: Recent Labs    04/25/21 0442 04/26/21 0338  WBC 12.7* 8.9  HGB 13.4 16.0*  HCT 39.6 47.1*  PLT 296 272   BMET:  Recent Labs    04/25/21 0442 04/26/21 0338  NA 139 138  K 4.2 4.3  CL 109 104  CO2 20* 25  GLUCOSE 129* 114*  BUN 15 16  CREATININE 0.73 0.77  CALCIUM 8.8* 9.1    PT/INR: No results for input(s): LABPROT, INR in the last 72 hours. ABG No results found for: PHART, HCO3, TCO2, ACIDBASEDEF, O2SAT CBG (last 3)  No results for input(s): GLUCAP in the last 72 hours.  Assessment/Plan: S/P Procedure(s) (LRB): LEFT HEART CATH AND CORONARY ANGIOGRAPHY (N/A) - Discussed with Dr. Irish Lack.  Any intervention would be high risk given her age and comborbidities. Currently stable on medical therapy. May give an initial trial of medical therapy and see how she does.     LOS: 2 days    Melrose Nakayama 04/26/2021

## 2021-04-26 NOTE — Progress Notes (Signed)
ANTICOAGULATION CONSULT NOTE   Pharmacy Consult for Heparin Indication: chest pain/ACS  Allergies  Allergen Reactions   Bee Venom Anaphylaxis   Celecoxib Other (See Comments)    Abdominal pain    Iodine Hives   Sulfamethoxazole-Trimethoprim Other (See Comments)    Restless legs, involuntary movements    Codeine Palpitations   Iodinated Diagnostic Agents Rash   Nabumetone Other (See Comments) and Palpitations    Tachycardia     Patient Measurements: Height: 5\' 2"  (157.5 cm) Weight: 92.3 kg (203 lb 8 oz) IBW/kg (Calculated) : 50.1 Heparin Dosing Weight: 72 kg  Vital Signs: Temp: 98.2 F (36.8 C) (11/30 0342) Temp Source: Oral (11/30 0342) BP: 141/83 (11/30 0342) Pulse Rate: 72 (11/30 0342)  Labs: Recent Labs    04/23/21 1054 04/23/21 1303 04/24/21 0102 04/24/21 0859 04/24/21 0926 04/24/21 0926 04/25/21 0442 04/25/21 1433 04/26/21 0338  HGB 15.6*  --   --  14.5  --   --  13.4  --  16.0*  HCT 45.6  --   --  43.6  --   --  39.6  --  47.1*  PLT 267  --   --  266  --   --  296  --  272  HEPARINUNFRC  --   --   --   --  0.31   < > 0.15* 0.37 0.23*  CREATININE 0.86  --   --  0.68  --   --  0.73  --   --   TROPONINIHS 7   < > 210* 475* 478*  --   --   --   --    < > = values in this interval not displayed.     Estimated Creatinine Clearance: 53.4 mL/min (by C-G formula based on SCr of 0.73 mg/dL).  Assessment: 87 y.o. F presents with CP. To begin heparin for ACS. No AC PTA. CBC ok on admission.  Pt is s/p cath>>CAD. Plan for CABG consult. Heparin resumed post sheath. Heparin level down to 0.23 (subtherapeutic) on gtt at 1250 units/hr.  Goal of Therapy:  Heparin level 0.3-0.7 units/ml Monitor platelets by anticoagulation protocol: Yes   Plan:  Increase heparin gtt to 1400 units/hr F/u 8 hr heparin level  Sherlon Handing, PharmD, BCPS Please see amion for complete clinical pharmacist phone list 04/26/2021, 5:18 AM

## 2021-04-26 NOTE — Progress Notes (Signed)
PROGRESS NOTE        PATIENT DETAILS Name: Shannon Walker Age: 87 y.o. Sex: female Date of Birth: 1933/02/08 Admit Date: 04/23/2021 Admitting Physician Eben Burow, MD [id number], Ruthell Rummage, NP  Brief Narrative: Patient is a 87 y.o. female with history of HTN, hypothyroidism, HLD, stable angina pectoris-who presented with worsening chest pain-found to have non-STEMI.  Subjective: Lying comfortably in bed-no chest pain this morning-she is leaning against CABG-and wants to proceed with PCI of the culprit lesion.  Objective: Vitals: Blood pressure (!) 141/83, pulse 72, temperature 98.2 F (36.8 C), temperature source Oral, resp. rate 16, height 5\' 2"  (1.575 m), weight 92.3 kg, SpO2 94 %.   Exam: Gen Exam:Alert awake-not in any distress HEENT:atraumatic, normocephalic Chest: B/L clear to auscultation anteriorly CVS:S1S2 regular Abdomen:soft non tender, non distended Extremities:no edema Neurology: Non focal Skin: no rash   Pertinent Labs/Radiology: Recent Labs  Lab 04/26/21 0338  WBC 8.9  HGB 16.0*  PLT 272  NA 138  K 4.3  CREATININE 0.77    11/27> CTA chest: No PE  Assessment/Plan: Non-STEMI: Continue medical management-LHC on 11/28 revealed severe triple-vessel disease-patient leaning against CABG-and would like to pursue PCI.  Will await further input from cardiology/cardiothoracic surgery   Hypothyroidism: Continue Synthroid  HTN: BP stable-continue metoprolol.  Anxiety: Continue Xanax as needed.  Morbid Obesity Estimated body mass index is 37.22 kg/m as calculated from the following:   Height as of this encounter: 5\' 2"  (1.575 m).   Weight as of this encounter: 92.3 kg.    Procedures: None Consults: None DVT Prophylaxis: IV Heparin Code Status:Full code  Family Communication: None at bedside. Time spent: 25 minutes-Greater than 50% of this time was spent in counseling, explanation of diagnosis, planning  of further management, and coordination of care.   Disposition Plan: Status is: Inpatient  Remains inpatient appropriate because: Non-STEMI-on maximal medical treatment-for LHC later today.   Diet: Diet Order             Diet Heart Room service appropriate? Yes; Fluid consistency: Thin  Diet effective now                     Antimicrobial agents: Anti-infectives (From admission, onward)    None        MEDICATIONS: Scheduled Meds:  aspirin EC  81 mg Oral Daily   levothyroxine  50 mcg Oral QAC breakfast   magnesium gluconate  500 mg Oral Daily   metoprolol tartrate  25 mg Oral BID   omega-3 acid ethyl esters  1,000 mg Oral Daily   sodium chloride flush  3 mL Intravenous Q12H   Continuous Infusions:  sodium chloride     heparin 1,400 Units/hr (04/26/21 0533)   lactated ringers Stopped (04/24/21 1253)   PRN Meds:.sodium chloride, acetaminophen **OR** acetaminophen, ALPRAZolam, metoprolol tartrate, morphine injection, nitroGLYCERIN, ondansetron **OR** ondansetron (ZOFRAN) IV, sodium chloride flush   I have personally reviewed following labs and imaging studies  LABORATORY DATA: CBC: Recent Labs  Lab 04/23/21 1054 04/24/21 0859 04/25/21 0442 04/26/21 0338  WBC 8.7 7.8 12.7* 8.9  HGB 15.6* 14.5 13.4 16.0*  HCT 45.6 43.6 39.6 47.1*  MCV 97.6 98.0 96.1 98.1  PLT 267 266 296 272     Basic Metabolic Panel: Recent Labs  Lab 04/23/21 1054 04/24/21 0859 04/25/21 0442 04/26/21 0338  NA  140 138 139 138  K 4.1 3.6 4.2 4.3  CL 106 105 109 104  CO2 28 24 20* 25  GLUCOSE 106* 117* 129* 114*  BUN 12 11 15 16   CREATININE 0.86 0.68 0.73 0.77  CALCIUM 9.2 8.7* 8.8* 9.1     GFR: Estimated Creatinine Clearance: 53.4 mL/min (by C-G formula based on SCr of 0.77 mg/dL).  Liver Function Tests: No results for input(s): AST, ALT, ALKPHOS, BILITOT, PROT, ALBUMIN in the last 168 hours. No results for input(s): LIPASE, AMYLASE in the last 168 hours. No results  for input(s): AMMONIA in the last 168 hours.  Coagulation Profile: No results for input(s): INR, PROTIME in the last 168 hours.  Cardiac Enzymes: No results for input(s): CKTOTAL, CKMB, CKMBINDEX, TROPONINI in the last 168 hours.  BNP (last 3 results) No results for input(s): PROBNP in the last 8760 hours.  Lipid Profile: Recent Labs    04/26/21 0338  CHOL 245*  HDL 50  LDLCALC 165*  TRIG 150*  CHOLHDL 4.9    Thyroid Function Tests: Recent Labs    04/23/21 1212  TSH 1.836     Anemia Panel: No results for input(s): VITAMINB12, FOLATE, FERRITIN, TIBC, IRON, RETICCTPCT in the last 72 hours.  Urine analysis: No results found for: COLORURINE, APPEARANCEUR, LABSPEC, PHURINE, GLUCOSEU, HGBUR, BILIRUBINUR, KETONESUR, PROTEINUR, UROBILINOGEN, NITRITE, LEUKOCYTESUR  Sepsis Labs: Lactic Acid, Venous No results found for: LATICACIDVEN  MICROBIOLOGY: Recent Results (from the past 240 hour(s))  Resp Panel by RT-PCR (Flu A&B, Covid) Nasopharyngeal Swab     Status: None   Collection Time: 04/24/21  3:44 AM   Specimen: Nasopharyngeal Swab; Nasopharyngeal(NP) swabs in vial transport medium  Result Value Ref Range Status   SARS Coronavirus 2 by RT PCR NEGATIVE NEGATIVE Final    Comment: (NOTE) SARS-CoV-2 target nucleic acids are NOT DETECTED.  The SARS-CoV-2 RNA is generally detectable in upper respiratory specimens during the acute phase of infection. The lowest concentration of SARS-CoV-2 viral copies this assay can detect is 138 copies/mL. A negative result does not preclude SARS-Cov-2 infection and should not be used as the sole basis for treatment or other patient management decisions. A negative result may occur with  improper specimen collection/handling, submission of specimen other than nasopharyngeal swab, presence of viral mutation(s) within the areas targeted by this assay, and inadequate number of viral copies(<138 copies/mL). A negative result must be combined  with clinical observations, patient history, and epidemiological information. The expected result is Negative.  Fact Sheet for Patients:  EntrepreneurPulse.com.au  Fact Sheet for Healthcare Providers:  IncredibleEmployment.be  This test is no t yet approved or cleared by the [place] FDA and  has been authorized for detection and/or diagnosis of SARS-CoV-2 by FDA under an Emergency Use Authorization (EUA). This EUA will remain  in effect (meaning this test can be used) for the duration of the COVID-19 declaration under Section 564(b)(1) of the Act, 21 U.S.C.section 360bbb-3(b)(1), unless the authorization is terminated  or revoked sooner.       Influenza A by PCR NEGATIVE NEGATIVE Final   Influenza B by PCR NEGATIVE NEGATIVE Final    Comment: (NOTE) The Xpert Xpress SARS-CoV-2/FLU/RSV plus assay is intended as an aid in the diagnosis of influenza from Nasopharyngeal swab specimens and should not be used as a sole basis for treatment. Nasal washings and aspirates are unacceptable for Xpert Xpress SARS-CoV-2/FLU/RSV testing.  Fact Sheet for Patients: EntrepreneurPulse.com.au  Fact Sheet for Healthcare Providers: IncredibleEmployment.be  This test is not  yet approved or cleared by the [place] and has been authorized for detection and/or diagnosis of SARS-CoV-2 by FDA under an Emergency Use Authorization (EUA). This EUA will remain in effect (meaning this test can be used) for the duration of the COVID-19 declaration under Section 564(b)(1) of the Act, 21 U.S.C. section 360bbb-3(b)(1), unless the authorization is terminated or revoked.  Performed at [facility], [place] [address] [place],  [address]     RADIOLOGY STUDIES/RESULTS: CARDIAC CATHETERIZATION  Result Date: 04/24/2021   Mid RCA lesion is 99% stenosed.   Prox RCA lesion is 40% stenosed.   Ost LM to Dist LM lesion  is 60% stenosed.   Dist LM to Prox LAD lesion is 60% stenosed.   Mid LAD lesion is 99% stenosed.   1st Diag lesion is 60% stenosed. Moderate to severe distal left main stenosis Moderate, heavily calcified ostial LAD stenosis. Severe heavily calcified mid LAD stenosis just past the takeoff of the diagonal branch. The diagonal branch has diffuse moderate stenosis. Mild non-obstructive disease in the Circumflex. The RCA is a large dominant vessel with heavy calcification in the proximal and mid vessel. The mid RCA has a severe, heavily calcified stenosis that is a functional CTO. The distal RCA branches fill from antegrade flow and from left to right collaterals. LVEDP 7 mmHg Recommendations: She has severe distal left main disease and severe, calcific disease in the RCA and LAD. Her disease would be best treated with CABG as I am not sure we would have a favorable result with PCI. She will be a high risk candidate for CABG given her age but overall she seems relatively functional. PCI will not be a good option for revascularization. Will ask CT surgery to see her for CABG. Resume IV heparin 8 hours post sheath pull. Continue ASA, statin and beta blocker.   ECHOCARDIOGRAM COMPLETE  Result Date: 04/24/2021    ECHOCARDIOGRAM REPORT   Patient Name:   Shannon Walker Date of Exam: 04/24/2021 Medical Rec #:  [id number]        Height:       62.0 in Accession #:    [id number]       Weight:       207.2 lb Date of Birth:  Aug 24, 1932        BSA:          1.941 m Patient Age:    61 years         BP:           131/60 mmHg Patient Gender: F                HR:           92 bpm. Exam Location:  Inpatient Procedure: 2D Echo, Cardiac Doppler, Color Doppler, Strain Analysis and 3D Echo Indications:    CAD Native Vessel I25.10  History:        Patient has prior history of Echocardiogram examinations, most                 recent 03/15/2021. Carotid Disease; Risk Factors:Hypertension                 and Dyslipidemia. Hypothyroidism.   Sonographer:    Darlina Sicilian RDCS Referring Phys: [place]  1. Global longitudinal strain is -15.2%. Left ventricular ejection fraction, by estimation, is 65 to 70%. The left ventricle has normal function. The left ventricle has no regional wall motion abnormalities.  There is mild left ventricular hypertrophy. Left ventricular diastolic parameters are indeterminate.  2. Right ventricular systolic function is normal. The right ventricular size is normal.  3. Trivial mitral valve regurgitation. Moderate mitral annular calcification.  4. Aortic valve regurgitation is not visualized. Aortic valve sclerosis is present, with no evidence of aortic valve stenosis.  5. The inferior vena cava is normal in size with greater than 50% respiratory variability, suggesting right atrial pressure of 3 mmHg. FINDINGS  Left Ventricle: Global longitudinal strain is -15.2%. Left ventricular ejection fraction, by estimation, is 65 to 70%. The left ventricle has normal function. The left ventricle has no regional wall motion abnormalities. The left ventricular internal cavity size was normal in size. There is mild left ventricular hypertrophy. Left ventricular diastolic parameters are indeterminate. Right Ventricle: The right ventricular size is normal. Right vetricular wall thickness was not assessed. Right ventricular systolic function is normal. Left Atrium: Left atrial size was normal in size. Right Atrium: Right atrial size was normal in size. Pericardium: There is no evidence of pericardial effusion. Mitral Valve: There is mild thickening of the mitral valve leaflet(s). Moderate mitral annular calcification. Trivial mitral valve regurgitation. MV peak gradient, 11.1 mmHg. The mean mitral valve gradient is 4.0 mmHg. Tricuspid Valve: The tricuspid valve is normal in structure. Tricuspid valve regurgitation is trivial. Aortic Valve: Aortic valve regurgitation is not visualized. Aortic valve sclerosis is  present, with no evidence of aortic valve stenosis. Pulmonic Valve: The pulmonic valve was normal in structure. Pulmonic valve regurgitation is not visualized. Aorta: The aortic root and ascending aorta are structurally normal, with no evidence of dilitation. Venous: The inferior vena cava is normal in size with greater than 50% respiratory variability, suggesting right atrial pressure of 3 mmHg.  LEFT VENTRICLE PLAX 2D LVIDd:         4.30 cm   Diastology LVIDs:         2.77 cm   LV e' medial:    4.38 cm/s LV PW:         1.20 cm   LV E/e' medial:  16.6 LV IVS:        1.30 cm   LV e' lateral:   6.66 cm/s LVOT diam:     1.90 cm   LV E/e' lateral: 10.9 LV SV:         50 LV SV Index:   26 LVOT Area:     2.84 cm                           3D Volume EF:                          3D EF:        54 %                          LV EDV:       76 ml                          LV ESV:       35 ml                          LV SV:        41 ml RIGHT VENTRICLE RV S prime:     11.20  cm/s LEFT ATRIUM             Index LA diam:        3.50 cm 1.80 cm/m LA Vol (A2C):   21.9 ml 11.28 ml/m LA Vol (A4C):   25.0 ml 12.88 ml/m LA Biplane Vol: 23.7 ml 12.21 ml/m  AORTIC VALVE LVOT Vmax:   98.70 cm/s LVOT Vmean:  55.400 cm/s LVOT VTI:    0.176 m  AORTA Ao Root diam: 3.20 cm Ao Asc diam:  3.30 cm MITRAL VALVE MV Area (PHT): 4.29 cm     SHUNTS MV Area VTI:   2.02 cm     Systemic VTI:  0.18 m MV Peak grad:  11.1 mmHg    Systemic Diam: 1.90 cm MV Mean grad:  4.0 mmHg MV Vmax:       1.66 m/s MV Vmean:      90.4 cm/s MV Decel Time: 177 msec MV E velocity: 72.60 cm/s MV A velocity: 162.00 cm/s MV E/A ratio:  0.45 Dorris Carnes MD Electronically signed by Dorris Carnes MD Signature Date/Time: 04/24/2021/6:09:20 PM    Final      LOS: 2 days   Oren Binet, MD  [facility]    To contact the attending provider between 7A-7P or the covering provider during after hours 7P-7A, please log into the web site [email or web address] and access using  universal [place] password for that web site. If you do not have the password, please call the hospital operator.  04/26/2021, 8:54 AM

## 2021-04-26 NOTE — Progress Notes (Signed)
Mobility Specialist Progress Note:   04/26/21 1652  Mobility  Activity Ambulated in hall  Level of Assistance Standby assist, set-up cues, supervision of patient - no hands on  Assistive Device Front wheel walker  Distance Ambulated (ft) 400 ft  Mobility Ambulated with assistance in hallway  Mobility Response Tolerated well  Mobility performed by Mobility specialist  $Mobility charge 1 Mobility   Second walk per patient request.  [facility] [place] [facility] Shannon Walker Mobility Specialist Primary Phone [phone] Secondary Phone [phone]

## 2021-04-26 NOTE — Progress Notes (Addendum)
Progress Note  Patient Name: Shannon Walker Date of Encounter: 04/26/2021  [facility] Cardiologist: Shirlee More, MD   Subjective   HR went up this morning with movement. No chest pain or dyspnea.   Inpatient Medications    Scheduled Meds:  aspirin EC  81 mg Oral Daily   levothyroxine  50 mcg Oral QAC breakfast   magnesium gluconate  500 mg Oral Daily   metoprolol tartrate  25 mg Oral BID   omega-3 acid ethyl esters  1,000 mg Oral Daily   rosuvastatin  20 mg Oral Daily   sodium chloride flush  3 mL Intravenous Q12H   Continuous Infusions:  sodium chloride     heparin 1,400 Units/hr (04/26/21 0533)   lactated ringers Stopped (04/24/21 1253)   PRN Meds: sodium chloride, acetaminophen **OR** acetaminophen, ALPRAZolam, metoprolol tartrate, morphine injection, nitroGLYCERIN, ondansetron **OR** ondansetron (ZOFRAN) IV, sodium chloride flush   Vital Signs    Vitals:   04/25/21 1102 04/25/21 1947 04/26/21 0342 04/26/21 0914  BP: 135/65 (!) 162/76 (!) 141/83 (!) 130/58  Pulse: 62 68 72 84  Resp: 18 18 16    Temp: 98.1 F (36.7 C) (!) 97.5 F (36.4 C) 98.2 F (36.8 C)   TempSrc: Oral Oral Oral   SpO2: 98% 97% 94%   Weight:   92.3 kg   Height:        Intake/Output Summary (Last 24 hours) at 04/26/2021 1106 Last data filed at 04/26/2021 1018 Gross per 24 hour  Intake 834 ml  Output 2300 ml  Net -1466 ml   Last 3 Weights 04/26/2021 04/25/2021 04/24/2021  Weight (lbs) 203 lb 8 oz 204 lb 6.4 oz 207 lb 3.7 oz  Weight (kg) 92.307 kg 92.715 kg 94 kg      Telemetry    SR, intermittent tachycardia - Personally Reviewed  ECG    N/A  Physical Exam   GEN: No acute distress.   Neck: No JVD Cardiac: RRR, no murmurs, rubs, or gallops.  Respiratory: Clear to auscultation bilaterally. GI: Soft, nontender, non-distended  MS: No edema; No deformity. Neuro:  Nonfocal  Psych: Normal affect   Labs    High Sensitivity Troponin:   Recent Labs  Lab  04/23/21 1054 04/23/21 1303 04/24/21 0102 04/24/21 0859 04/24/21 0926  TROPONINIHS 7 7 210* 475* 478*     Chemistry Recent Labs  Lab 04/24/21 0859 04/25/21 0442 04/26/21 0338  NA 138 139 138  K 3.6 4.2 4.3  CL 105 109 104  CO2 24 20* 25  GLUCOSE 117* 129* 114*  BUN 11 15 16   CREATININE 0.68 0.73 0.77  CALCIUM 8.7* 8.8* 9.1  GFRNONAA >60 >60 >60  ANIONGAP 9 10 9     Lipids  Recent Labs  Lab 04/26/21 0338  CHOL 245*  TRIG 150*  HDL 50  LDLCALC 165*  CHOLHDL 4.9    Hematology Recent Labs  Lab 04/24/21 0859 04/25/21 0442 04/26/21 0338  WBC 7.8 12.7* 8.9  RBC 4.45 4.12 4.80  HGB 14.5 13.4 16.0*  HCT 43.6 39.6 47.1*  MCV 98.0 96.1 98.1  MCH 32.6 32.5 33.3  MCHC 33.3 33.8 34.0  RDW 13.4 13.5 13.3  PLT 266 296 272   Thyroid  Recent Labs  Lab 04/23/21 1212  TSH 1.836     DDimer  Recent Labs  Lab 04/23/21 1303  DDIMER 1.34*     Radiology    CARDIAC CATHETERIZATION  Result Date: 04/24/2021   Mid RCA lesion is 99% stenosed.  Prox RCA lesion is 40% stenosed.   Ost LM to Dist LM lesion is 60% stenosed.   Dist LM to Prox LAD lesion is 60% stenosed.   Mid LAD lesion is 99% stenosed.   1st Diag lesion is 60% stenosed. Moderate to severe distal left main stenosis Moderate, heavily calcified ostial LAD stenosis. Severe heavily calcified mid LAD stenosis just past the takeoff of the diagonal branch. The diagonal branch has diffuse moderate stenosis. Mild non-obstructive disease in the Circumflex. The RCA is a large dominant vessel with heavy calcification in the proximal and mid vessel. The mid RCA has a severe, heavily calcified stenosis that is a functional CTO. The distal RCA branches fill from antegrade flow and from left to right collaterals. LVEDP 7 mmHg Recommendations: She has severe distal left main disease and severe, calcific disease in the RCA and LAD. Her disease would be best treated with CABG as I am not sure we would have a favorable result with PCI.  She will be a high risk candidate for CABG given her age but overall she seems relatively functional. PCI will not be a good option for revascularization. Will ask CT surgery to see her for CABG. Resume IV heparin 8 hours post sheath pull. Continue ASA, statin and beta blocker.   ECHOCARDIOGRAM COMPLETE  Result Date: 04/24/2021    ECHOCARDIOGRAM REPORT   Patient Name:   Shannon Walker Date of Exam: 04/24/2021 Medical Rec #:  [id number]        Height:       62.0 in Accession #:    [id number]       Weight:       207.2 lb Date of Birth:  August 08, 1932        BSA:          1.941 m Patient Age:    87 years         BP:           131/60 mmHg Patient Gender: F                HR:           92 bpm. Exam Location:  Inpatient Procedure: 2D Echo, Cardiac Doppler, Color Doppler, Strain Analysis and 3D Echo Indications:    CAD Native Vessel I25.10  History:        Patient has prior history of Echocardiogram examinations, most                 recent 03/15/2021. Carotid Disease; Risk Factors:Hypertension                 and Dyslipidemia. Hypothyroidism.  Sonographer:    Darlina Sicilian RDCS Referring Phys: [place]  1. Global longitudinal strain is -15.2%. Left ventricular ejection fraction, by estimation, is 65 to 70%. The left ventricle has normal function. The left ventricle has no regional wall motion abnormalities. There is mild left ventricular hypertrophy. Left ventricular diastolic parameters are indeterminate.  2. Right ventricular systolic function is normal. The right ventricular size is normal.  3. Trivial mitral valve regurgitation. Moderate mitral annular calcification.  4. Aortic valve regurgitation is not visualized. Aortic valve sclerosis is present, with no evidence of aortic valve stenosis.  5. The inferior vena cava is normal in size with greater than 50% respiratory variability, suggesting right atrial pressure of 3 mmHg. FINDINGS  Left Ventricle: Global longitudinal strain is  -15.2%. Left ventricular ejection fraction, by estimation, is 65 to 70%. The  left ventricle has normal function. The left ventricle has no regional wall motion abnormalities. The left ventricular internal cavity size was normal in size. There is mild left ventricular hypertrophy. Left ventricular diastolic parameters are indeterminate. Right Ventricle: The right ventricular size is normal. Right vetricular wall thickness was not assessed. Right ventricular systolic function is normal. Left Atrium: Left atrial size was normal in size. Right Atrium: Right atrial size was normal in size. Pericardium: There is no evidence of pericardial effusion. Mitral Valve: There is mild thickening of the mitral valve leaflet(s). Moderate mitral annular calcification. Trivial mitral valve regurgitation. MV peak gradient, 11.1 mmHg. The mean mitral valve gradient is 4.0 mmHg. Tricuspid Valve: The tricuspid valve is normal in structure. Tricuspid valve regurgitation is trivial. Aortic Valve: Aortic valve regurgitation is not visualized. Aortic valve sclerosis is present, with no evidence of aortic valve stenosis. Pulmonic Valve: The pulmonic valve was normal in structure. Pulmonic valve regurgitation is not visualized. Aorta: The aortic root and ascending aorta are structurally normal, with no evidence of dilitation. Venous: The inferior vena cava is normal in size with greater than 50% respiratory variability, suggesting right atrial pressure of 3 mmHg.  LEFT VENTRICLE PLAX 2D LVIDd:         4.30 cm   Diastology LVIDs:         2.77 cm   LV e' medial:    4.38 cm/s LV PW:         1.20 cm   LV E/e' medial:  16.6 LV IVS:        1.30 cm   LV e' lateral:   6.66 cm/s LVOT diam:     1.90 cm   LV E/e' lateral: 10.9 LV SV:         50 LV SV Index:   26 LVOT Area:     2.84 cm                           3D Volume EF:                          3D EF:        54 %                          LV EDV:       76 ml                          LV ESV:       35  ml                          LV SV:        41 ml RIGHT VENTRICLE RV S prime:     11.20 cm/s LEFT ATRIUM             Index LA diam:        3.50 cm 1.80 cm/m LA Vol (A2C):   21.9 ml 11.28 ml/m LA Vol (A4C):   25.0 ml 12.88 ml/m LA Biplane Vol: 23.7 ml 12.21 ml/m  AORTIC VALVE LVOT Vmax:   98.70 cm/s LVOT Vmean:  55.400 cm/s LVOT VTI:    0.176 m  AORTA Ao Root diam: 3.20 cm Ao Asc diam:  3.30 cm MITRAL VALVE MV Area (PHT): 4.29 cm  SHUNTS MV Area VTI:   2.02 cm     Systemic VTI:  0.18 m MV Peak grad:  11.1 mmHg    Systemic Diam: 1.90 cm MV Mean grad:  4.0 mmHg MV Vmax:       1.66 m/s MV Vmean:      90.4 cm/s MV Decel Time: 177 msec MV E velocity: 72.60 cm/s MV A velocity: 162.00 cm/s MV E/A ratio:  0.45 Dorris Carnes MD Electronically signed by Dorris Carnes MD Signature Date/Time: 04/24/2021/6:09:20 PM    Final     Cardiac Studies   Echo 04/24/2021  1. Global longitudinal strain is -15.2%. Left ventricular ejection  fraction, by estimation, is 65 to 70%. The left ventricle has normal  function. The left ventricle has no regional wall motion abnormalities.  There is mild left ventricular hypertrophy.  Left ventricular diastolic parameters are indeterminate.   2. Right ventricular systolic function is normal. The right ventricular  size is normal.   3. Trivial mitral valve regurgitation. Moderate mitral annular  calcification.   4. Aortic valve regurgitation is not visualized. Aortic valve sclerosis  is present, with no evidence of aortic valve stenosis.   5. The inferior vena cava is normal in size with greater than 50%  respiratory variability, suggesting right atrial pressure of 3 mmHg.   LEFT HEART CATH AND CORONARY ANGIOGRAPHY  04/24/2021   Conclusion      Mid RCA lesion is 99% stenosed.   Prox RCA lesion is 40% stenosed.   Ost LM to Dist LM lesion is 60% stenosed.   Dist LM to Prox LAD lesion is 60% stenosed.   Mid LAD lesion is 99% stenosed.   1st Diag lesion is 60% stenosed.    Moderate to severe distal left main stenosis Moderate, heavily calcified ostial LAD stenosis. Severe heavily calcified mid LAD stenosis just past the takeoff of the diagonal branch. The diagonal branch has diffuse moderate stenosis. Mild non-obstructive disease in the Circumflex.  The RCA is a large dominant vessel with heavy calcification in the proximal and mid vessel. The mid RCA has a severe, heavily calcified stenosis that is a functional CTO. The distal RCA branches fill from antegrade flow and from left to right collaterals.  LVEDP 7 mmHg   Recommendations: She has severe distal left main disease and severe, calcific disease in the RCA and LAD. Her disease would be best treated with CABG as I am not sure we would have a favorable result with PCI. She will be a high risk candidate for CABG given her age but overall she seems relatively functional. PCI will not be a good option for revascularization. Will ask CT surgery to see her for CABG. Resume IV heparin 8 hours post sheath pull. Continue ASA, statin and beta blocker.   Patient Profile     87 y.o. female with a hx of HTN, carotid stenosis, hypothyroidism presented for chest pain evaluation and found to have severe CAD.   Assessment & Plan   1.NSTEMI/CAD -Troponin [id number]. Treated with heparin.  - Cath with severe RCA, LM and LAD disease. CTCS following >> could be high risk for CABG. Final recom pending. Atherectomy would be high risk as well.  - Currently chest pain free - Continue ASA, Crestor and BB - Will add Imdur 30mg  qd as recommended yesterday  - Continue heparin - Echo with preserved LVEF  2. HLD - 04/26/2021: Cholesterol 245; HDL 50; LDL Cholesterol 165; Triglycerides 150; VLDL 30  - Started Crestor 20mg   this admisson - Consider OP f/u labs 6-8 weeks given statin initiation this admission.   3. HTN - BP stable on BB  4. Intermittent tachycardia - Seems occurs with movement - Continue BB - Monitor on tele        For questions or updates, please contact [facility] Please consult [email or web address] for contact info under        Signed, Leanor Kail, PA  04/26/2021, 11:06 AM     I have examined the patient and reviewed assessment and plan and discussed with patient.  Agree with above as stated.    Adding nitrates for symptoms.  Aggressive secondary prevention including lipid-lowering therapy.  We discussed nonsurgical options for treatment which include medical therapy alone or medical therapy with an attempt at PCI of the mid RCA using atherectomy.  She is still considering her options.  I think as long as she is not having symptoms with intensified medical therapy, we could just see how she does with conservative therapy.  She walked with a mobility specialist and did not have any chest discomfort.  Our goals would be just to get her where she can stay living independently doing her activities of daily living comfortably.  I agree that heart surgery would entail a risk of a prolonged recovery.  Larae Grooms

## 2021-04-27 ENCOUNTER — Other Ambulatory Visit (HOSPITAL_COMMUNITY): Payer: Self-pay

## 2021-04-27 LAB — CBC
HCT: 40.5 % (ref 36.0–46.0)
Hemoglobin: 13.7 g/dL (ref 12.0–15.0)
MCH: 33.1 pg (ref 26.0–34.0)
MCHC: 33.8 g/dL (ref 30.0–36.0)
MCV: 97.8 fL (ref 80.0–100.0)
Platelets: 249 10*3/uL (ref 150–400)
RBC: 4.14 MIL/uL (ref 3.87–5.11)
RDW: 13.6 % (ref 11.5–15.5)
WBC: 8.5 10*3/uL (ref 4.0–10.5)
nRBC: 0 % (ref 0.0–0.2)

## 2021-04-27 LAB — HEPARIN LEVEL (UNFRACTIONATED): Heparin Unfractionated: 0.45 IU/mL (ref 0.30–0.70)

## 2021-04-27 MED ORDER — NITROGLYCERIN 0.3 MG SL SUBL
0.3000 mg | SUBLINGUAL_TABLET | SUBLINGUAL | 12 refills | Status: DC | PRN
  Filled 2021-04-27: qty 30, fill #0

## 2021-04-27 MED ORDER — ROSUVASTATIN CALCIUM 20 MG PO TABS
20.0000 mg | ORAL_TABLET | Freq: Every day | ORAL | 3 refills | Status: DC
  Filled 2021-04-27: qty 30, 30d supply, fill #0

## 2021-04-27 MED ORDER — ISOSORBIDE MONONITRATE ER 30 MG PO TB24
30.0000 mg | ORAL_TABLET | Freq: Every day | ORAL | 3 refills | Status: DC
  Filled 2021-04-27: qty 90, 90d supply, fill #0

## 2021-04-27 MED ORDER — METOPROLOL TARTRATE 25 MG PO TABS
25.0000 mg | ORAL_TABLET | Freq: Two times a day (BID) | ORAL | 2 refills | Status: DC
  Filled 2021-04-27: qty 60, 30d supply, fill #0

## 2021-04-27 NOTE — Progress Notes (Signed)
Mobility Specialist Progress Note:   04/27/21 1034  Mobility  Activity Ambulated in hall  Level of Assistance Standby assist, set-up cues, supervision of patient - no hands on  Assistive Device Front wheel walker  Distance Ambulated (ft) 400 ft  Mobility Ambulated with assistance in hallway  Mobility Response Tolerated well  Mobility performed by Mobility specialist  $Mobility charge 1 Mobility   Pt received in bed willing to participate in mobility. No complaints of pain and asymptomatic. Pt returned to EOB with call bell in reach and all needs met.  [facility] Public librarian Phone [phone] Secondary Phone [phone]

## 2021-04-27 NOTE — Progress Notes (Signed)
Updated daughter Jeannene Patella at the bedside with the current plan of care and answered questions about the new medication IMDUR and its potential side effects with daughter and patient. Patient and daughter verbalize understanding.

## 2021-04-27 NOTE — Discharge Summary (Signed)
PATIENT DETAILS Name: Shannon Walker Age: 87 y.o. Sex: female Date of Birth: 1932-08-05 MRN: [id number]. Admitting Physician: Eben Burow, MD [id number], Ruthell Rummage, NP  Admit Date: 04/23/2021 Discharge date: 04/27/2021  Recommendations for Outpatient Follow-up:  Follow up with PCP in 1-2 weeks Please obtain CMP/CBC in one week Please ensure follow-up with cardiology Consider outpatient palliative care  Admitted From:  Home  Disposition: [place]: No  Equipment/Devices: None  Discharge Condition: Stable  CODE STATUS: DNR  Diet recommendation:  Diet Order             Diet - low sodium heart healthy           Diet Heart Room service appropriate? Yes; Fluid consistency: Thin  Diet effective now                    Brief Summary: Patient is a 87 year old female with history of HTN, HLD, stable angina pectoris-who presented with worsening chest pain-found to have non-STEMI.  See below for further details.   Brief Hospital Course: Non-STEMI: Initially managed medically-subsequently underwent LHC which showed severe triple-vessel disease-extensive discussion was held with patient by cardiology/cardiothoracic surgery-patient did not want to pursue high risk CABG.  She is currently chest pain-free with medical management-plans are to discharge her home with current regimen-if angina reoccurs-to consider PCI then.  Please ensure outpatient follow-up with cardiology.  Hypothyroidism: Continue Synthroid and  HTN: BP stable-continue metoprolol and Imdur  Anxiety: Continue as needed Xanax:   Morbid obesity Estimated body mass index is 37.15 kg/m as calculated from the following:   Height as of this encounter: 5\' 2"  (1.575 m).   Weight as of this encounter: 92.1 kg.      Procedures LHC on 11/28  Discharge Diagnoses:  Principal Problem:   NSTEMI (non-ST elevated myocardial infarction) ([place]) Active Problems:   Shortness of  breath   Mixed hyperlipidemia   Obesity with body mass index of 30.0-39.9   Essential hypertension   Discharge Instructions:  Activity:  As tolerated    Discharge Instructions     Call MD for:  difficulty breathing, headache or visual disturbances   Complete by: As directed    Call MD for:  persistant nausea and vomiting   Complete by: As directed    Call MD for:  severe uncontrolled pain   Complete by: As directed    Diet - low sodium heart healthy   Complete by: As directed    Discharge instructions   Complete by: As directed    Follow with Primary MD  Bess Harvest Ruthell Rummage, NP in 1-2 weeks  Please get a complete blood count and chemistry panel checked by your Primary MD at your next visit, and again as instructed by your Primary MD.  Get Medicines reviewed and adjusted: Please take all your medications with you for your next visit with your Primary MD  Laboratory/radiological data: Please request your Primary MD to go over all hospital tests and procedure/radiological results at the follow up, please ask your Primary MD to get all Hospital records sent to his/her office.  In some cases, they will be blood work, cultures and biopsy results pending at the time of your discharge. Please request that your primary care M.D. follows up on these results.  Also Note the following: If you experience worsening of your admission symptoms, develop shortness of breath, life threatening emergency, suicidal or homicidal thoughts you must seek medical attention immediately by calling  911 or calling your MD immediately  if symptoms less severe.  You must read complete instructions/literature along with all the possible adverse reactions/side effects for all the Medicines you take and that have been prescribed to you. Take any new Medicines after you have completely understood and accpet all the possible adverse reactions/side effects.   Do not drive when taking Pain medications or  sleeping medications (Benzodaizepines)  Do not take more than prescribed Pain, Sleep and Anxiety Medications. It is not advisable to combine anxiety,sleep and pain medications without talking with your primary care practitioner  Special Instructions: If you have smoked or chewed Tobacco  in the last 2 yrs please stop smoking, stop any regular Alcohol  and or any Recreational drug use.  Wear Seat belts while driving.  Please note: You were cared for by a hospitalist during your hospital stay. Once you are discharged, your primary care physician will handle any further medical issues. Please note that NO REFILLS for any discharge medications will be authorized once you are discharged, as it is imperative that you return to your primary care physician (or establish a relationship with a primary care physician if you do not have one) for your post hospital discharge needs so that they can reassess your need for medications and monitor your lab values.   Increase activity slowly   Complete by: As directed       Allergies as of 04/27/2021       Reactions   Bee Venom Anaphylaxis   Celecoxib Other (See Comments)   Abdominal pain   Iodine Hives   Sulfamethoxazole-trimethoprim Other (See Comments)   Restless legs, involuntary movements   Codeine Palpitations   Iodinated Diagnostic Agents Rash   Nabumetone Other (See Comments), Palpitations   Tachycardia        Medication List     STOP taking these medications    diltiazem 30 MG tablet Commonly known as: Cardizem   diphenhydrAMINE 50 MG tablet Commonly known as: BENADRYL       TAKE these medications    ALPRAZolam 0.5 MG tablet Commonly known as: XANAX Take 0.25 mg by mouth every 8 (eight) hours as needed for anxiety.   aspirin EC 81 MG tablet Take 81 mg by mouth every other day.   cyanocobalamin 100 MCG tablet Take 1 tablet by mouth daily.   Garlic 485 MG Caps Take 500 mg by mouth daily.   isosorbide mononitrate 30 MG  24 hr tablet Commonly known as: IMDUR Take 1 tablet (30 mg total) by mouth daily.   levothyroxine 50 MCG tablet Commonly known as: SYNTHROID Take 50 mcg by mouth daily before breakfast.   Magnesium Gluconate 550 MG Tabs Take 1 capsule by mouth daily.   metoprolol tartrate 25 MG tablet Commonly known as: LOPRESSOR Take 1 tablet (25 mg total) by mouth 2 (two) times daily.   nitroGLYCERIN 0.3 MG SL tablet Commonly known as: NITROSTAT Place 1 tablet (0.3 mg total) under the tongue as needed for chest pain.   Omega-3 1000 MG Caps Take 1 tablet by mouth daily.   predniSONE 50 MG tablet Commonly known as: DELTASONE Take one tablet 13 hours, 7 hours, and 1 hour prior to your cardiac CT   rosuvastatin 20 MG tablet Commonly known as: CRESTOR Take 1 tablet (20 mg total) by mouth daily. Start taking on: April 28, 2021   Vitamin D (Cholecalciferol) 25 MCG (1000 UT) Caps Take 2,000 Units by mouth daily.  Follow-up Information     Brown-Patram, Ruthell Rummage, NP. Schedule an appointment as soon as possible for a visit in 1 week(s).   Specialty: Internal Medicine Contact information: [place] [address] [phone]         Richardo Priest, MD Follow up in 1 week(s).   Specialties: Cardiology, Radiology Contact information: [address] [place] [address] [phone]                Allergies  Allergen Reactions   Bee Venom Anaphylaxis   Celecoxib Other (See Comments)    Abdominal pain    Iodine Hives   Sulfamethoxazole-Trimethoprim Other (See Comments)    Restless legs, involuntary movements    Codeine Palpitations   Iodinated Diagnostic Agents Rash   Nabumetone Other (See Comments) and Palpitations    Tachycardia       Consultations:  cardiology.  Cardiothoracic surgery   Other Procedures/Studies: CT Angio Chest PE W and/or Wo Contrast  Result Date: 04/23/2021 CLINICAL DATA:  Positive D-dimer, PE suspected,  low/intermediate probability. EXAM: CT ANGIOGRAPHY CHEST WITH CONTRAST TECHNIQUE: Multidetector CT imaging of the chest was performed using the standard protocol during bolus administration of intravenous contrast. Multiplanar CT image reconstructions and MIPs were obtained to evaluate the vascular anatomy. CONTRAST:  12mL OMNIPAQUE IOHEXOL 350 MG/ML SOLN COMPARISON:  04/23/2021, 08/23/2014. FINDINGS: Cardiovascular: The heart is enlarged there is no pericardial effusion. Coronary artery calcifications are noted. There is calcification of the mitral valve. Atherosclerotic calcification of the aorta without evidence of aneurysm. The pulmonary trunk is distended which may be associated with underlying pulmonary artery hypertension. No pulmonary artery filling defect is identified. Evaluation of the pulmonary arteries at the right lung base is limited due to compressive atelectasis. Mediastinum/Nodes: No enlarged mediastinal, hilar, or axillary lymph nodes. Thyroid gland, trachea, and esophagus demonstrate no significant findings. Lungs/Pleura: There is elevation of the right diaphragm. Atelectasis is present at the lung bases bilaterally. No effusion or pneumothorax. Upper Abdomen: Small hiatal hernia. The gallbladder is surgically absent. There is a calcification in the left kidney measuring 4 mm. A hyperdense lesion is seen along the posterior aspect of the left kidney measuring 8 mm, possible proteinaceous or hemorrhagic cyst. Scattered diverticula are noted along the colon. Musculoskeletal: There is compression deformity at T6 which is unchanged from 2016. Review of the MIP images confirms the above findings. IMPRESSION: 1. No evidence of pulmonary embolism or other acute process. 2. Elevation of the right diaphragm with atelectasis at the lung bases bilaterally. 3. Cardiomegaly with multi-vessel coronary artery calcifications. 4. Mildly distended pulmonary trunk which may be associated with underlying pulmonary  artery hypertension. 5. Remaining findings as described above. Electronically Signed   By: Brett Fairy M.D.   On: 04/23/2021 23:39   CARDIAC CATHETERIZATION  Result Date: 04/24/2021   Mid RCA lesion is 99% stenosed.   Prox RCA lesion is 40% stenosed.   Ost LM to Dist LM lesion is 60% stenosed.   Dist LM to Prox LAD lesion is 60% stenosed.   Mid LAD lesion is 99% stenosed.   1st Diag lesion is 60% stenosed. Moderate to severe distal left main stenosis Moderate, heavily calcified ostial LAD stenosis. Severe heavily calcified mid LAD stenosis just past the takeoff of the diagonal branch. The diagonal branch has diffuse moderate stenosis. Mild non-obstructive disease in the Circumflex. The RCA is a large dominant vessel with heavy calcification in the proximal and mid vessel. The mid RCA has a severe,  heavily calcified stenosis that is a functional CTO. The distal RCA branches fill from antegrade flow and from left to right collaterals. LVEDP 7 mmHg Recommendations: She has severe distal left main disease and severe, calcific disease in the RCA and LAD. Her disease would be best treated with CABG as I am not sure we would have a favorable result with PCI. She will be a high risk candidate for CABG given her age but overall she seems relatively functional. PCI will not be a good option for revascularization. Will ask CT surgery to see her for CABG. Resume IV heparin 8 hours post sheath pull. Continue ASA, statin and beta blocker.   DG Chest Port 1 View  Result Date: 04/23/2021 CLINICAL DATA:  Chest pain. EXAM: PORTABLE CHEST 1 VIEW COMPARISON:  03/13/2021 and older exams. FINDINGS: Cardiac silhouette is normal in size. No mediastinal or hilar masses. Lung volumes are low. There is bronchovascular crowding and linear lung base atelectasis. Lungs otherwise clear. No convincing pleural effusion and no pneumothorax. Skeletal structures are grossly intact. IMPRESSION: No acute cardiopulmonary disease.  Electronically Signed   By: Lajean Manes M.D.   On: 04/23/2021 12:26   MM 3D SCREEN BREAST BILATERAL  Result Date: 03/30/2021 CLINICAL DATA:  Screening. EXAM: DIGITAL SCREENING BILATERAL MAMMOGRAM WITH TOMOSYNTHESIS AND CAD TECHNIQUE: Bilateral screening digital craniocaudal and mediolateral oblique mammograms were obtained. Bilateral screening digital breast tomosynthesis was performed. The images were evaluated with computer-aided detection. COMPARISON:  Previous exam(s). ACR Breast Density Category a: The breast tissue is almost entirely fatty. FINDINGS: There are no findings suspicious for malignancy. IMPRESSION: No mammographic evidence of malignancy. A result letter of this screening mammogram will be mailed directly to the patient. RECOMMENDATION: Screening mammogram in one year. (Code:[id number]) BI-RADS CATEGORY  1: Negative. Electronically Signed   By: Marin Olp M.D.   On: 03/30/2021 15:37   ECHOCARDIOGRAM COMPLETE  Result Date: 04/24/2021    ECHOCARDIOGRAM REPORT   Patient Name:   ODETTE WATANABE Date of Exam: 04/24/2021 Medical Rec #:  [id number]        Height:       62.0 in Accession #:    [id number]       Weight:       207.2 lb Date of Birth:  05-09-1933        BSA:          1.941 m Patient Age:    20 years         BP:           131/60 mmHg Patient Gender: F                HR:           92 bpm. Exam Location:  Inpatient Procedure: 2D Echo, Cardiac Doppler, Color Doppler, Strain Analysis and 3D Echo Indications:    CAD Native Vessel I25.10  History:        Patient has prior history of Echocardiogram examinations, most                 recent 03/15/2021. Carotid Disease; Risk Factors:Hypertension                 and Dyslipidemia. Hypothyroidism.  Sonographer:    Darlina Sicilian RDCS Referring Phys: [place]  1. Global longitudinal strain is -15.2%. Left ventricular ejection fraction, by estimation, is 65 to 70%. The left ventricle has normal function. The left  ventricle has no regional wall motion abnormalities.  There is mild left ventricular hypertrophy. Left ventricular diastolic parameters are indeterminate.  2. Right ventricular systolic function is normal. The right ventricular size is normal.  3. Trivial mitral valve regurgitation. Moderate mitral annular calcification.  4. Aortic valve regurgitation is not visualized. Aortic valve sclerosis is present, with no evidence of aortic valve stenosis.  5. The inferior vena cava is normal in size with greater than 50% respiratory variability, suggesting right atrial pressure of 3 mmHg. FINDINGS  Left Ventricle: Global longitudinal strain is -15.2%. Left ventricular ejection fraction, by estimation, is 65 to 70%. The left ventricle has normal function. The left ventricle has no regional wall motion abnormalities. The left ventricular internal cavity size was normal in size. There is mild left ventricular hypertrophy. Left ventricular diastolic parameters are indeterminate. Right Ventricle: The right ventricular size is normal. Right vetricular wall thickness was not assessed. Right ventricular systolic function is normal. Left Atrium: Left atrial size was normal in size. Right Atrium: Right atrial size was normal in size. Pericardium: There is no evidence of pericardial effusion. Mitral Valve: There is mild thickening of the mitral valve leaflet(s). Moderate mitral annular calcification. Trivial mitral valve regurgitation. MV peak gradient, 11.1 mmHg. The mean mitral valve gradient is 4.0 mmHg. Tricuspid Valve: The tricuspid valve is normal in structure. Tricuspid valve regurgitation is trivial. Aortic Valve: Aortic valve regurgitation is not visualized. Aortic valve sclerosis is present, with no evidence of aortic valve stenosis. Pulmonic Valve: The pulmonic valve was normal in structure. Pulmonic valve regurgitation is not visualized. Aorta: The aortic root and ascending aorta are structurally normal, with no evidence of  dilitation. Venous: The inferior vena cava is normal in size with greater than 50% respiratory variability, suggesting right atrial pressure of 3 mmHg.  LEFT VENTRICLE PLAX 2D LVIDd:         4.30 cm   Diastology LVIDs:         2.77 cm   LV e' medial:    4.38 cm/s LV PW:         1.20 cm   LV E/e' medial:  16.6 LV IVS:        1.30 cm   LV e' lateral:   6.66 cm/s LVOT diam:     1.90 cm   LV E/e' lateral: 10.9 LV SV:         50 LV SV Index:   26 LVOT Area:     2.84 cm                           3D Volume EF:                          3D EF:        54 %                          LV EDV:       76 ml                          LV ESV:       35 ml                          LV SV:        41 ml RIGHT VENTRICLE RV S prime:     11.20  cm/s LEFT ATRIUM             Index LA diam:        3.50 cm 1.80 cm/m LA Vol (A2C):   21.9 ml 11.28 ml/m LA Vol (A4C):   25.0 ml 12.88 ml/m LA Biplane Vol: 23.7 ml 12.21 ml/m  AORTIC VALVE LVOT Vmax:   98.70 cm/s LVOT Vmean:  55.400 cm/s LVOT VTI:    0.176 m  AORTA Ao Root diam: 3.20 cm Ao Asc diam:  3.30 cm MITRAL VALVE MV Area (PHT): 4.29 cm     SHUNTS MV Area VTI:   2.02 cm     Systemic VTI:  0.18 m MV Peak grad:  11.1 mmHg    Systemic Diam: 1.90 cm MV Mean grad:  4.0 mmHg MV Vmax:       1.66 m/s MV Vmean:      90.4 cm/s MV Decel Time: 177 msec MV E velocity: 72.60 cm/s MV A velocity: 162.00 cm/s MV E/A ratio:  0.45 Dorris Carnes MD Electronically signed by Dorris Carnes MD Signature Date/Time: 04/24/2021/6:09:20 PM    Final      TODAY-DAY OF DISCHARGE:  Subjective:   Judd Lien today has no headache,no chest abdominal pain,no new weakness tingling or numbness, feels much better wants to go home today.   Objective:   Blood pressure 115/71, pulse 80, temperature (!) 97.4 F (36.3 C), temperature source Oral, resp. rate 18, height 5\' 2"  (1.575 m), weight 92.1 kg, SpO2 99 %.  Intake/Output Summary (Last 24 hours) at 04/27/2021 1159 Last data filed at 04/27/2021 0819 Gross per 24 hour   Intake 840 ml  Output --  Net 840 ml   Filed Weights   04/25/21 0318 04/26/21 0342 04/27/21 0335  Weight: 92.7 kg 92.3 kg 92.1 kg    Exam: Awake Alert, Oriented *3, No new F.N deficits, Normal affect [place].AT,PERRAL Supple Neck,No JVD, No cervical lymphadenopathy appriciated.  Symmetrical Chest wall movement, Good air movement bilaterally, CTAB RRR,No Gallops,Rubs or new Murmurs, No Parasternal Heave +ve B.Sounds, Abd Soft, Non tender, No organomegaly appriciated, No rebound -guarding or rigidity. No Cyanosis, Clubbing or edema, No new Rash or bruise   PERTINENT RADIOLOGIC STUDIES: No results found.   PERTINENT LAB RESULTS: CBC: Recent Labs    04/26/21 0338 04/27/21 0038  WBC 8.9 8.5  HGB 16.0* 13.7  HCT 47.1* 40.5  PLT 272 249   CMET CMP     Component Value Date/Time   NA 138 04/26/2021 0338   K 4.3 04/26/2021 0338   CL 104 04/26/2021 0338   CO2 25 04/26/2021 0338   GLUCOSE 114 (H) 04/26/2021 0338   BUN 16 04/26/2021 0338   CREATININE 0.77 04/26/2021 0338   CALCIUM 9.1 04/26/2021 0338   PROT 7.2 06/30/2014 0755   ALBUMIN 3.8 06/30/2014 0755   AST 27 06/30/2014 0755   ALT 26 06/30/2014 0755   ALKPHOS 87 06/30/2014 0755   BILITOT 0.2 (L) 06/30/2014 0755   GFRNONAA >60 04/26/2021 0338   GFRAA >90 06/30/2014 0755    GFR Estimated Creatinine Clearance: 53.3 mL/min (by C-G formula based on SCr of 0.77 mg/dL). No results for input(s): LIPASE, AMYLASE in the last 72 hours. No results for input(s): CKTOTAL, CKMB, CKMBINDEX, TROPONINI in the last 72 hours. Invalid input(s): POCBNP No results for input(s): DDIMER in the last 72 hours. No results for input(s): HGBA1C in the last 72 hours. Recent Labs    04/26/21 0338  CHOL 245*  HDL 50  LDLCALC 165*  TRIG 150*  CHOLHDL 4.9   No results for input(s): TSH, T4TOTAL, T3FREE, THYROIDAB in the last 72 hours.  Invalid input(s): FREET3 No results for input(s): VITAMINB12, FOLATE, FERRITIN, TIBC, IRON, RETICCTPCT  in the last 72 hours. Coags: No results for input(s): INR in the last 72 hours.  Invalid input(s): PT Microbiology: Recent Results (from the past 240 hour(s))  Resp Panel by RT-PCR (Flu A&B, Covid) Nasopharyngeal Swab     Status: None   Collection Time: 04/24/21  3:44 AM   Specimen: Nasopharyngeal Swab; Nasopharyngeal(NP) swabs in vial transport medium  Result Value Ref Range Status   SARS Coronavirus 2 by RT PCR NEGATIVE NEGATIVE Final    Comment: (NOTE) SARS-CoV-2 target nucleic acids are NOT DETECTED.  The SARS-CoV-2 RNA is generally detectable in upper respiratory specimens during the acute phase of infection. The lowest concentration of SARS-CoV-2 viral copies this assay can detect is 138 copies/mL. A negative result does not preclude SARS-Cov-2 infection and should not be used as the sole basis for treatment or other patient management decisions. A negative result may occur with  improper specimen collection/handling, submission of specimen other than nasopharyngeal swab, presence of viral mutation(s) within the areas targeted by this assay, and inadequate number of viral copies(<138 copies/mL). A negative result must be combined with clinical observations, patient history, and epidemiological information. The expected result is Negative.  Fact Sheet for Patients:  EntrepreneurPulse.com.au  Fact Sheet for Healthcare Providers:  IncredibleEmployment.be  This test is no t yet approved or cleared by the [place] FDA and  has been authorized for detection and/or diagnosis of SARS-CoV-2 by FDA under an Emergency Use Authorization (EUA). This EUA will remain  in effect (meaning this test can be used) for the duration of the COVID-19 declaration under Section 564(b)(1) of the Act, 21 U.S.C.section 360bbb-3(b)(1), unless the authorization is terminated  or revoked sooner.       Influenza A by PCR NEGATIVE NEGATIVE Final   Influenza B  by PCR NEGATIVE NEGATIVE Final    Comment: (NOTE) The Xpert Xpress SARS-CoV-2/FLU/RSV plus assay is intended as an aid in the diagnosis of influenza from Nasopharyngeal swab specimens and should not be used as a sole basis for treatment. Nasal washings and aspirates are unacceptable for Xpert Xpress SARS-CoV-2/FLU/RSV testing.  Fact Sheet for Patients: EntrepreneurPulse.com.au  Fact Sheet for Healthcare Providers: IncredibleEmployment.be  This test is not yet approved or cleared by the [place] FDA and has been authorized for detection and/or diagnosis of SARS-CoV-2 by FDA under an Emergency Use Authorization (EUA). This EUA will remain in effect (meaning this test can be used) for the duration of the COVID-19 declaration under Section 564(b)(1) of the Act, 21 U.S.C. section 360bbb-3(b)(1), unless the authorization is terminated or revoked.  Performed at [facility], [place] [address] [place], [place] [address]     FURTHER DISCHARGE INSTRUCTIONS:  Get Medicines reviewed and adjusted: Please take all your medications with you for your next visit with your Primary MD  Laboratory/radiological data: Please request your Primary MD to go over all hospital tests and procedure/radiological results at the follow up, please ask your Primary MD to get all Hospital records sent to his/her office.  In some cases, they will be blood work, cultures and biopsy results pending at the time of your discharge. Please request that your primary care M.D. goes through all the records of your hospital data and follows up on these results.  Also Note the  following: If you experience worsening of your admission symptoms, develop shortness of breath, life threatening emergency, suicidal or homicidal thoughts you must seek medical attention immediately by calling 911 or calling your MD immediately  if symptoms less severe.  You must read complete  instructions/literature along with all the possible adverse reactions/side effects for all the Medicines you take and that have been prescribed to you. Take any new Medicines after you have completely understood and accpet all the possible adverse reactions/side effects.   Do not drive when taking Pain medications or sleeping medications (Benzodaizepines)  Do not take more than prescribed Pain, Sleep and Anxiety Medications. It is not advisable to combine anxiety,sleep and pain medications without talking with your primary care practitioner  Special Instructions: If you have smoked or chewed Tobacco  in the last 2 yrs please stop smoking, stop any regular Alcohol  and or any Recreational drug use.  Wear Seat belts while driving.  Please note: You were cared for by a hospitalist during your hospital stay. Once you are discharged, your primary care physician will handle any further medical issues. Please note that NO REFILLS for any discharge medications will be authorized once you are discharged, as it is imperative that you return to your primary care physician (or establish a relationship with a primary care physician if you do not have one) for your post hospital discharge needs so that they can reassess your need for medications and monitor your lab values.  Total Time spent coordinating discharge including counseling, education and face to face time equals 35 minutes.  Signed: Carolann Brazell 04/27/2021 11:59 AM

## 2021-04-27 NOTE — Progress Notes (Signed)
ANTICOAGULATION CONSULT NOTE   Pharmacy Consult for Heparin Indication: chest pain/ACS  Allergies  Allergen Reactions   Bee Venom Anaphylaxis   Celecoxib Other (See Comments)    Abdominal pain    Iodine Hives   Sulfamethoxazole-Trimethoprim Other (See Comments)    Restless legs, involuntary movements    Codeine Palpitations   Iodinated Diagnostic Agents Rash   Nabumetone Other (See Comments) and Palpitations    Tachycardia     Patient Measurements: Height: 5\' 2"  (157.5 cm) Weight: 92.1 kg (203 lb 1.6 oz) IBW/kg (Calculated) : 50.1 Heparin Dosing Weight: 72 kg  Vital Signs: Temp: 97.8 F (36.6 C) (12/01 0837) Temp Source: Oral (12/01 0837) BP: 137/91 (12/01 0837) Pulse Rate: 85 (12/01 0837)  Labs: Recent Labs    04/24/21 0926 04/24/21 0926 04/25/21 0442 04/25/21 1433 04/26/21 0338 04/26/21 1303 04/27/21 0038  HGB  --    < > 13.4  --  16.0*  --  13.7  HCT  --   --  39.6  --  47.1*  --  40.5  PLT  --   --  296  --  272  --  249  HEPARINUNFRC 0.31  --  0.15*   < > 0.23* 0.42 0.45  CREATININE  --   --  0.73  --  0.77  --   --   TROPONINIHS 478*  --   --   --   --   --   --    < > = values in this interval not displayed.     Estimated Creatinine Clearance: 53.3 mL/min (by C-G formula based on SCr of 0.77 mg/dL).  Assessment: 87 y.o. F presents with CP. To begin heparin for ACS. No AC PTA. CBC ok on admission.  Pt is s/p cath>>CAD. Pt remains on heparin while determining PCI vs CABG vs med mgmt trial. CBC stable, heparin level within goal.  Goal of Therapy:  Heparin level 0.3-0.7 units/ml Monitor platelets by anticoagulation protocol: Yes   Plan:  Continue heparin 1400 units/hr Daily heparin level and CBC  Arrie Senate, PharmD, [place], [facility] Clinical Pharmacist [phone] Please check AMION for all [place] numbers 04/27/2021

## 2021-04-27 NOTE — Progress Notes (Addendum)
Pt complaining of left sided flank pain. 5/10. Pt refusing pain meds. States she was told to notify of pain due to new medications. Cardiology notified. Deferred to hospitalist. MD notified.

## 2021-04-27 NOTE — Progress Notes (Signed)
CARDIAC REHAB PHASE I   Discussed heart disease with pt and family. Pt and family deny questions or concerns. Everyone in agreement and comfortable with the plan. Hopeful for d/c soon.  [phone] Rufina Falco, RN BSN 04/27/2021 1:33 PM

## 2021-04-27 NOTE — Progress Notes (Addendum)
Progress Note  Patient Name: Shannon Walker Date of Encounter: 04/27/2021  [facility] Cardiologist: Shirlee More, MD   Subjective   No chest pain while walking in the room.  Inpatient Medications    Scheduled Meds:  aspirin EC  81 mg Oral Daily   isosorbide mononitrate  30 mg Oral Daily   levothyroxine  50 mcg Oral QAC breakfast   magnesium gluconate  500 mg Oral Daily   metoprolol tartrate  25 mg Oral BID   omega-3 acid ethyl esters  1,000 mg Oral Daily   rosuvastatin  20 mg Oral Daily   sodium chloride flush  3 mL Intravenous Q12H   Continuous Infusions:  sodium chloride     heparin 1,400 Units/hr (04/26/21 2128)   PRN Meds: sodium chloride, acetaminophen **OR** acetaminophen, ALPRAZolam, metoprolol tartrate, morphine injection, nitroGLYCERIN, ondansetron **OR** ondansetron (ZOFRAN) IV, sodium chloride flush   Vital Signs    Vitals:   04/26/21 2141 04/27/21 0335 04/27/21 0658 04/27/21 0837  BP: (!) 101/52 (!) 102/58 138/79 (!) 137/91  Pulse: 71 78 78 85  Resp:  15 18 16   Temp:  98.4 F (36.9 C) 98.1 F (36.7 C) 97.8 F (36.6 C)  TempSrc:  Oral Oral Oral  SpO2:  95% 97% 96%  Weight:  92.1 kg    Height:        Intake/Output Summary (Last 24 hours) at 04/27/2021 1052 Last data filed at 04/27/2021 0819 Gross per 24 hour  Intake 840 ml  Output --  Net 840 ml   Last 3 Weights 04/27/2021 04/26/2021 04/25/2021  Weight (lbs) 203 lb 1.6 oz 203 lb 8 oz 204 lb 6.4 oz  Weight (kg) 92.126 kg 92.307 kg 92.715 kg      Telemetry    Normal sinus rhythm- Personally Reviewed  ECG      Physical Exam   GEN: No acute distress.   Neck: No JVD Cardiac: RRR, no murmurs, rubs, or gallops.  Respiratory: Clear to auscultation bilaterally. GI: Soft, nontender, non-distended  MS: No edema; No deformity. Neuro:  Nonfocal  Psych: Normal affect   Labs    High Sensitivity Troponin:   Recent Labs  Lab 04/23/21 1054 04/23/21 1303 04/24/21 0102 04/24/21 0859  04/24/21 0926  TROPONINIHS 7 7 210* 475* 478*     Chemistry Recent Labs  Lab 04/24/21 0859 04/25/21 0442 04/26/21 0338  NA 138 139 138  K 3.6 4.2 4.3  CL 105 109 104  CO2 24 20* 25  GLUCOSE 117* 129* 114*  BUN 11 15 16   CREATININE 0.68 0.73 0.77  CALCIUM 8.7* 8.8* 9.1  GFRNONAA >60 >60 >60  ANIONGAP 9 10 9     Lipids  Recent Labs  Lab 04/26/21 0338  CHOL 245*  TRIG 150*  HDL 50  LDLCALC 165*  CHOLHDL 4.9    Hematology Recent Labs  Lab 04/25/21 0442 04/26/21 0338 04/27/21 0038  WBC 12.7* 8.9 8.5  RBC 4.12 4.80 4.14  HGB 13.4 16.0* 13.7  HCT 39.6 47.1* 40.5  MCV 96.1 98.1 97.8  MCH 32.5 33.3 33.1  MCHC 33.8 34.0 33.8  RDW 13.5 13.3 13.6  PLT 296 272 249   Thyroid  Recent Labs  Lab 04/23/21 1212  TSH 1.836    BNPNo results for input(s): BNP, PROBNP in the last 168 hours.  DDimer  Recent Labs  Lab 04/23/21 1303  DDIMER 1.34*     Radiology    No results found.  Cardiac Studies   Cath showing  severe, complex three-vessel disease  Patient Profile     87 y.o. female with NSTEMI and severe CAD  Assessment & Plan    CAD/NSTEMI: We again discussed options for revascularization.  CABG would be too high risk.  PCI with atherectomy is also high risk.  Since she is doing well with medications, she will give that a try.  Her primary concern is that she remains independent and can live at home.  She has family who lives next door and helps her.  Her big concern is not being able to care for herself and requiring going to a nursing home.  We spoke about goals of treatment and keeping her comfortable in terms of avoiding chest pain and shortness of breath.  She is in agreement with this.  We talked about what she would want if her heart were to stop or she would require a breathing machine.  Since it is unlikely that she would be able to return home and live independently after such an event, she prefers to avoid such heroic measures.  She agrees to DNR/DNI.  We  will continue to treat her coronary artery disease medically.  If she has refractory angina on medical therapy, we can always readdress PCI of the RCA at a later time.  Will stop heparin.  Continue isosorbide and metoprolol.  Continue rosuvastatin.  If anginal symptoms return, could increase isosorbide.  Would also make sure that she has fresh sublingual nitroglycerin to use at home.  Okay to discharge today from a cardiac standpoint.    Medical management of hypertension and hyperlipidemia. Consider palliative care consult to help the family with issues about goals of care.  I think she has good family support.  Will set up follow-up in our office per her preference.     For questions or updates, please contact [place] Please consult [email or web address] for contact info under        Signed, Larae Grooms, MD  04/27/2021, 10:52 AM

## 2021-04-27 NOTE — Plan of Care (Signed)

## 2021-04-27 NOTE — Care Management Important Message (Signed)
Important Message  Patient Details  Name: Shannon Walker MRN: [id number] Date of Birth: 12/19/1932   Medicare Important Message Given:  Yes     Shelda Altes 04/27/2021, 8:26 AM

## 2021-04-27 NOTE — Progress Notes (Addendum)
Progress Note  Patient Name: Shannon Walker Date of Encounter: 04/27/2021  Primary Cardiologist: Shirlee More, MD  Subjective   Called by nursing that patient was reporting L sided flank/back pain. Patient had been instructed to notify for any occurrences of pain so cardiology paged. She reports history of multiple crushed vertebrae so has chronic back issues from time to time. This is described as a focal soreness to the left lower back. No dysuria, chest pain or dyspnea with this. VSS.   Inpatient Medications    Scheduled Meds:  aspirin EC  81 mg Oral Daily   isosorbide mononitrate  30 mg Oral Daily   levothyroxine  50 mcg Oral QAC breakfast   magnesium gluconate  500 mg Oral Daily   metoprolol tartrate  25 mg Oral BID   omega-3 acid ethyl esters  1,000 mg Oral Daily   rosuvastatin  20 mg Oral Daily   sodium chloride flush  3 mL Intravenous Q12H   Continuous Infusions:  sodium chloride     heparin 1,400 Units/hr (04/26/21 2128)   lactated ringers Stopped (04/24/21 1253)   PRN Meds: sodium chloride, acetaminophen **OR** acetaminophen, ALPRAZolam, metoprolol tartrate, morphine injection, nitroGLYCERIN, ondansetron **OR** ondansetron (ZOFRAN) IV, sodium chloride flush   Vital Signs    Vitals:   04/26/21 1928 04/26/21 2141 04/27/21 0335 04/27/21 0658  BP: (!) 95/46 (!) 101/52 (!) 102/58 138/79  Pulse: 70 71 78 78  Resp: _0 Temp: 97.8 F (36.6 C)  98.4 F (36.9 C) 98.1 F (36.7 C)  TempSrc: Oral  Oral Oral  SpO2: 92%  95% 97%  Weight:   92.1 kg   Height:        Intake/Output Summary (Last 24 hours) at 04/27/2021 0715 Last data filed at 04/26/2021 1841 Gross per 24 hour  Intake 838 ml  Output 200 ml  Net 638 ml   Last 3 Weights 04/27/2021 04/26/2021 04/25/2021  Weight (lbs) 203 lb 1.6 oz 203 lb 8 oz 204 lb 6.4 oz  Weight (kg) 92.126 kg 92.307 kg 92.715 kg     Telemetry    NSR - Personally Reviewed Physical Exam   GEN: No acute distress.  HEENT:  Normocephalic, atraumatic, sclera non-icteric. Neck: No JVD or bruits. Cardiac: RRR, soft SEM over RSB, no rubs or gallops.  Respiratory: Clear to auscultation bilaterally. Breathing is unlabored. GI: Soft, nontender, non-distended, BS +x 4. MS: no deformity. Focal left lower back soreness of the soft tissue, no ecchymosis or swelling Extremities: No clubbing or cyanosis. No edema. Distal pedal pulses are 2+ and equal bilaterally. Right radial cath site without mild ecchymosis proximally, no hematoma; good pulse. Neuro:  AAOx3. Follows commands.  Psych:  Responds to questions appropriately with a normal affect.  Labs    High Sensitivity Troponin:   Recent Labs  Lab 04/23/21 1054 04/23/21 1303 04/24/21 0102 04/24/21 0859 04/24/21 0926  TROPONINIHS 7 7 210* 475* 478*      Cardiac EnzymesNo results for input(s): TROPONINI in the last 168 hours. No results for input(s): TROPIPOC in the last 168 hours.   Chemistry Recent Labs  Lab 04/24/21 0859 04/25/21 0442 04/26/21 0338  NA 138 139 138  K 3.6 4.2 4.3  CL 105 109 104  CO2 24 20* 25  GLUCOSE 117* 129* 114*  BUN _1 CREATININE 0.68 0.73 0.77  CALCIUM 8.7* 8.8* 9.1  GFRNONAA >60 >60 >60  ANIONGAP _2 Hematology Recent  Labs  Lab 04/25/21 0442 04/26/21 0338 04/27/21 0038  WBC 12.7* 8.9 8.5  RBC 4.12 4.80 4.14  HGB 13.4 16.0* 13.7  HCT 39.6 47.1* 40.5  MCV 96.1 98.1 97.8  MCH 32.5 33.3 33.1  MCHC 33.8 34.0 33.8  RDW 13.5 13.3 13.6  PLT 296 272 249    BNPNo results for input(s): BNP, PROBNP in the last 168 hours.   DDimer  Recent Labs  Lab 04/23/21 1303  DDIMER 1.34*     Radiology    No results found.  Cardiac Studies   2D Echo 04/24/21  1. Global longitudinal strain is -15.2%. Left ventricular ejection  fraction, by estimation, is 65 to 70%. The left ventricle has normal  function. The left ventricle has no regional wall motion abnormalities.  There is mild left ventricular  hypertrophy.  Left ventricular diastolic parameters are indeterminate.   2. Right ventricular systolic function is normal. The right ventricular  size is normal.   3. Trivial mitral valve regurgitation. Moderate mitral annular  calcification.   4. Aortic valve regurgitation is not visualized. Aortic valve sclerosis  is present, with no evidence of aortic valve stenosis.   5. The inferior vena cava is normal in size with greater than 50%  respiratory variability, suggesting right atrial pressure of 3 mmHg.   Cath 04/24/21   Mid RCA lesion is 99% stenosed.   Prox RCA lesion is 40% stenosed.   Ost LM to Dist LM lesion is 60% stenosed.   Dist LM to Prox LAD lesion is 60% stenosed.   Mid LAD lesion is 99% stenosed.   1st Diag lesion is 60% stenosed.   Moderate to severe distal left main stenosis Moderate, heavily calcified ostial LAD stenosis. Severe heavily calcified mid LAD stenosis just past the takeoff of the diagonal branch. The diagonal branch has diffuse moderate stenosis. Mild non-obstructive disease in the Circumflex.  The RCA is a large dominant vessel with heavy calcification in the proximal and mid vessel. The mid RCA has a severe, heavily calcified stenosis that is a functional CTO. The distal RCA branches fill from antegrade flow and from left to right collaterals.  LVEDP 7 mmHg   Recommendations: She has severe distal left main disease and severe, calcific disease in the RCA and LAD. Her disease would be best treated with CABG as I am not sure we would have a favorable result with PCI. She will be a high risk candidate for CABG given her age but overall she seems relatively functional. PCI will not be a good option for revascularization. Will ask CT surgery to see her for CABG. Resume IV heparin 8 hours post sheath pull. Continue ASA, statin and beta blocker.     Patient Profile     87 y.o. female with history of HTN, carotid stenosis, hyperthyroidism presented with chest  pain/NSTEMI found to have severe CAD.  Assessment & Plan    1. NSTEMI/CAD -Troponin 210>>475>>478. Treated with heparin.  - Cath with severe RCA, LM and LAD disease. Options reviewed with cardiology team and CVTS -> CABG felt to be high risk; Dr. Irish Lack also discussed include medical therapy alone or medical therapy with an attempt at PCI of the mid RCA using atherectomy -> Imdur added yesterday - remains on ASA, heparin (duration per MD), metoprolol, isosorbide - will request cath site tegaderm be replaced with clean bandaid - had dried blood on it but cath site without any oozing, bleeding or hematoma - MD to see to review final  plans  2. Hyperlipidemia - LDL 165, started rosuvastatin - f/u baseline LFTs in AM - If the patient is tolerating statin at time of follow-up appointment, would consider rechecking liver function/lipid panel in 6-8 weeks.  3. Essential HTN - fluctuating BP this admission from low-normal to 130s, currently stable by last check at 138/79  4. Intermittent tachycardia - reported in prior rounding notes - brief run of atrial tach yesterday on telemetry otherwise no significant arrhythmias in last 24 hrs - TSH wnl  5. Soft systolic murmur - 2D echo with moderate MAC and aortic sclerosis without stenosis which may be contributing  6. Left flank/back pain - no evidence of trauma or ecchymosis; patient had R radial cath not femoral, VSS and CBC generally similar compared to prior trends therefore no other clinical sx to suggest RPH - no chest pain or dyspnea with this - she has mild tenderness of the soft tissue in this area, suspicious for MSK etiology in the context of chronic back issues - when paged for symptoms, also asked nurse to relay to IM team caring for patient   For questions or updates, please contact [facility] Please consult [email or web address] for contact info under Cardiology/STEMI.  Signed, Charlie Pitter, PA-C 04/27/2021, 7:15 AM    Please see  my note from today as well.   Jettie Booze, MD

## 2021-04-28 ENCOUNTER — Other Ambulatory Visit: Payer: Self-pay

## 2021-04-28 ENCOUNTER — Encounter (HOSPITAL_COMMUNITY): Payer: Self-pay | Admitting: *Deleted

## 2021-04-28 ENCOUNTER — Inpatient Hospital Stay (HOSPITAL_COMMUNITY)
Admission: EM | Admit: 2021-04-28 | Discharge: 2021-04-30 | DRG: 303 | Disposition: A | Discharge status: Home / Self Care | Payer: Medicare Other | Attending: Student in an Organized Health Care Education/Training Program | Admitting: Student in an Organized Health Care Education/Training Program

## 2021-04-28 DIAGNOSIS — F419 Anxiety disorder, unspecified: Secondary | ICD-10-CM | POA: Diagnosis present

## 2021-04-28 DIAGNOSIS — R7303 Prediabetes: Secondary | ICD-10-CM | POA: Diagnosis present

## 2021-04-28 DIAGNOSIS — I200 Unstable angina: Secondary | ICD-10-CM | POA: Diagnosis present

## 2021-04-28 DIAGNOSIS — Z91030 Bee allergy status: Secondary | ICD-10-CM | POA: Diagnosis not present

## 2021-04-28 DIAGNOSIS — Z888 Allergy status to other drugs, medicaments and biological substances status: Secondary | ICD-10-CM

## 2021-04-28 DIAGNOSIS — I25110 Atherosclerotic heart disease of native coronary artery with unstable angina pectoris: Secondary | ICD-10-CM | POA: Diagnosis present

## 2021-04-28 DIAGNOSIS — Z7982 Long term (current) use of aspirin: Secondary | ICD-10-CM

## 2021-04-28 DIAGNOSIS — Z79899 Other long term (current) drug therapy: Secondary | ICD-10-CM | POA: Diagnosis not present

## 2021-04-28 DIAGNOSIS — E039 Hypothyroidism, unspecified: Secondary | ICD-10-CM | POA: Diagnosis present

## 2021-04-28 DIAGNOSIS — Z6837 Body mass index (BMI) 37.0-37.9, adult: Secondary | ICD-10-CM | POA: Diagnosis not present

## 2021-04-28 DIAGNOSIS — Z882 Allergy status to sulfonamides status: Secondary | ICD-10-CM | POA: Diagnosis not present

## 2021-04-28 DIAGNOSIS — I252 Old myocardial infarction: Secondary | ICD-10-CM | POA: Diagnosis not present

## 2021-04-28 DIAGNOSIS — Z885 Allergy status to narcotic agent status: Secondary | ICD-10-CM | POA: Diagnosis not present

## 2021-04-28 DIAGNOSIS — Z8249 Family history of ischemic heart disease and other diseases of the circulatory system: Secondary | ICD-10-CM | POA: Diagnosis not present

## 2021-04-28 DIAGNOSIS — Z91041 Radiographic dye allergy status: Secondary | ICD-10-CM

## 2021-04-28 DIAGNOSIS — Z96659 Presence of unspecified artificial knee joint: Secondary | ICD-10-CM | POA: Diagnosis present

## 2021-04-28 DIAGNOSIS — Z833 Family history of diabetes mellitus: Secondary | ICD-10-CM

## 2021-04-28 DIAGNOSIS — Z832 Family history of diseases of the blood and blood-forming organs and certain disorders involving the immune mechanism: Secondary | ICD-10-CM | POA: Diagnosis not present

## 2021-04-28 DIAGNOSIS — I6523 Occlusion and stenosis of bilateral carotid arteries: Secondary | ICD-10-CM | POA: Diagnosis present

## 2021-04-28 DIAGNOSIS — I249 Acute ischemic heart disease, unspecified: Secondary | ICD-10-CM

## 2021-04-28 DIAGNOSIS — I10 Essential (primary) hypertension: Secondary | ICD-10-CM | POA: Diagnosis present

## 2021-04-28 DIAGNOSIS — Z20822 Contact with and (suspected) exposure to covid-19: Secondary | ICD-10-CM | POA: Diagnosis present

## 2021-04-28 DIAGNOSIS — I214 Non-ST elevation (NSTEMI) myocardial infarction: Secondary | ICD-10-CM | POA: Diagnosis not present

## 2021-04-28 DIAGNOSIS — Z79890 Hormone replacement therapy (postmenopausal): Secondary | ICD-10-CM | POA: Diagnosis not present

## 2021-04-28 DIAGNOSIS — E782 Mixed hyperlipidemia: Secondary | ICD-10-CM | POA: Diagnosis present

## 2021-04-28 DIAGNOSIS — I209 Angina pectoris, unspecified: Secondary | ICD-10-CM | POA: Diagnosis not present

## 2021-04-28 DIAGNOSIS — I2510 Atherosclerotic heart disease of native coronary artery without angina pectoris: Secondary | ICD-10-CM

## 2021-04-28 HISTORY — DX: Unstable angina: I20.0

## 2021-04-28 HISTORY — DX: Atherosclerotic heart disease of native coronary artery without angina pectoris: I25.10

## 2021-04-28 LAB — CBC
HCT: 46.1 % — ABNORMAL HIGH (ref 36.0–46.0)
Hemoglobin: 15.2 g/dL — ABNORMAL HIGH (ref 12.0–15.0)
MCH: 32.7 pg (ref 26.0–34.0)
MCHC: 33 g/dL (ref 30.0–36.0)
MCV: 99.1 fL (ref 80.0–100.0)
Platelets: 246 10*3/uL (ref 150–400)
RBC: 4.65 MIL/uL (ref 3.87–5.11)
RDW: 13.3 % (ref 11.5–15.5)
WBC: 8.6 10*3/uL (ref 4.0–10.5)
nRBC: 0 % (ref 0.0–0.2)

## 2021-04-28 LAB — BASIC METABOLIC PANEL
Anion gap: 9 (ref 5–15)
BUN: 21 mg/dL (ref 8–23)
CO2: 22 mmol/L (ref 22–32)
Calcium: 9.2 mg/dL (ref 8.9–10.3)
Chloride: 105 mmol/L (ref 98–111)
Creatinine, Ser: 0.83 mg/dL (ref 0.44–1.00)
GFR, Estimated: >60 mL/min (ref >60)
Glucose, Bld: 118 mg/dL — ABNORMAL HIGH (ref 70–99)
Potassium: 4.3 mmol/L (ref 3.5–5.1)
Sodium: 136 mmol/L (ref 135–145)

## 2021-04-28 LAB — PROTIME-INR
INR: 1 (ref 0.8–1.2)
Prothrombin Time: 12.8 seconds (ref 11.4–15.2)

## 2021-04-28 LAB — TROPONIN I (HIGH SENSITIVITY)
Troponin I (High Sensitivity): 79 ng/L — ABNORMAL HIGH (ref <18)
Troponin I (High Sensitivity): 100 ng/L (ref <18)

## 2021-04-28 MED ORDER — NITROGLYCERIN 2 % TD OINT: 1.0000 [in_us] | TOPICAL_OINTMENT | Freq: Once | TRANSDERMAL | Status: DC

## 2021-04-28 MED ORDER — METOPROLOL TARTRATE 25 MG PO TABS
25.0000 mg | ORAL_TABLET | Freq: Two times a day (BID) | ORAL | Status: DC
  Administered 2021-04-29 (×2): 25 mg via ORAL
  Filled 2021-04-28 (×2): qty 1

## 2021-04-28 MED ORDER — ASPIRIN EC 81 MG PO TBEC: 81.0000 mg | DELAYED_RELEASE_TABLET | ORAL | Status: DC

## 2021-04-28 MED ORDER — ACETAMINOPHEN 325 MG PO TABS: 650.0000 mg | ORAL_TABLET | ORAL | Status: DC | PRN

## 2021-04-28 MED ORDER — ISOSORBIDE MONONITRATE ER 60 MG PO TB24
60.0000 mg | ORAL_TABLET | Freq: Every day | ORAL | Status: DC
  Administered 2021-04-29 – 2021-04-30 (×2): 60 mg via ORAL
  Filled 2021-04-28 (×2): qty 1

## 2021-04-28 MED ORDER — ASPIRIN EC 81 MG PO TBEC
81.0000 mg | DELAYED_RELEASE_TABLET | Freq: Every day | ORAL | Status: DC
  Administered 2021-04-29: 81 mg via ORAL
  Filled 2021-04-28: qty 1

## 2021-04-28 MED ORDER — LEVOTHYROXINE SODIUM 50 MCG PO TABS
50.0000 ug | ORAL_TABLET | Freq: Every day | ORAL | Status: DC
  Administered 2021-04-29 – 2021-04-30 (×2): 50 ug via ORAL
  Filled 2021-04-28 (×2): qty 1

## 2021-04-28 MED ORDER — ROSUVASTATIN CALCIUM 20 MG PO TABS
20.0000 mg | ORAL_TABLET | Freq: Every day | ORAL | Status: DC
  Administered 2021-04-29 – 2021-04-30 (×2): 20 mg via ORAL
  Filled 2021-04-28 (×2): qty 1

## 2021-04-28 MED ORDER — NITROGLYCERIN 0.3 MG SL SUBL: 0.3000 mg | SUBLINGUAL_TABLET | SUBLINGUAL | Status: DC | PRN

## 2021-04-28 MED ORDER — ISOSORBIDE MONONITRATE ER 30 MG PO TB24
30.0000 mg | ORAL_TABLET | Freq: Once | ORAL | Status: AC
  Administered 2021-04-29: 30 mg via ORAL
  Filled 2021-04-28: qty 1

## 2021-04-28 MED ORDER — NITROGLYCERIN 0.4 MG SL SUBL: 0.4000 mg | SUBLINGUAL_TABLET | SUBLINGUAL | Status: DC | PRN

## 2021-04-28 MED ORDER — ONDANSETRON HCL 4 MG/2ML IJ SOLN: 4.0000 mg | Freq: Four times a day (QID) | INTRAMUSCULAR | Status: DC | PRN

## 2021-04-28 NOTE — Consult Note (Incomplete)
Cardiology Consultation:   Patient ID: Shannon Walker MRN: [id number]; DOB: 01/16/1933  Admit date: 04/28/2021 Date of Consult: 04/28/2021  Primary Care Provider: Mayer Camel, NP [facility] Cardiologist: Shannon More, MD  [place] Electrophysiologist:  None    Patient Profile:   Shannon Walker is a 87 y.o. female with a hx of *** who is being seen today for the evaluation of *** at the request of ***.  History of Present Illness:   Shannon Walker ***  Last night shoulder pain at 0130, woke her from sleep  Only lasted 5 minutes then went, thinks she took [facility] x1, but was very sleepy No CP with it   Was fine again when she woke up  1500-1600 said she didn't feel well  Chest pressure/heaviness  Was sitting still  Lasted around 5 minutes  Recurrent 30-45 minutes after that  [place] first one and didn't help, then took repeat and it helped  Chest pressure went away few minutes  Not as severe as in the past before hospitalization  A lot of times it happens when she stands up, but says walking doesn't really change it  Way better now than it was before recent admission (both severity and duration)   hsT 79 today from 478 F   P 85 BP 134/72 (90) 98% RA  RR 16  CP free now, CP free since in ambulance  Lives 50 miles away and concerned about sx recurrence   Past Medical History:  Diagnosis Date   Acquired hypothyroidism 09/16/2015   Last Assessment & Plan:  Relevant Hx: Course: Daily Update: Today's Plan:continue to follow her reviewed her last level for her  Electronically signed by: Shannon Camel, NP 11/16/15 1241   Allergic rhinitis due to pollen 11/02/2011   Aneurysm of abdominal aorta    Arthritis, midfoot 09/16/2015   Bilateral carotid artery stenosis 12/09/2012   Calculus of kidney    Chest pain 12/09/2012   Coronary artery disease    Cystocele, lateral    Cystocele, midline    Cystocele, unspecified (CODE)  09/16/2015   Disorder of bone and cartilage, unspecified    Dyspnea and respiratory abnormality 12/09/2012   Edema of left foot 06/12/2017   Elevated blood pressure reading 12/09/2012   Encounter for long-term (current) use of aspirin 12/09/2012   Encounter for long-term (current) use of other medications 12/09/2012   Headache 12/09/2012   Hematuria, unspecified    HTN (hypertension) 10/05/2013   Hypertension    Left foot pain 06/12/2017   Localized osteoarthrosis not specified whether primary or secondary, unspecified site    Malaise and fatigue 12/09/2012   Last Assessment & Plan:  Relevant Hx: Course: Daily Update: Today's Plan:she is feeling her energy is returning for her of which she is really pleased with  Electronically signed by: Shannon Camel, NP 11/16/15 1247   Menopausal disorder 09/16/2015   Last Assessment & Plan:  Relevant Hx: Course: Daily Update: Today's Plan:this is stable for her and discussed UTI and relationship with lack of estrogen her UA here today was clear for her which she was pleased with  Electronically signed by: Shannon Camel, NP 11/16/15 1246   Mixed hyperlipidemia 09/16/2015   Last Assessment & Plan:  Relevant Hx: Course: Daily Update: Today's Plan:plan to see her lipids when she returns in September and update them then  Electronically signed by: Shannon Camel, NP 11/16/15 1246   Morbid obesity ([place])  Neoplasm of uncertain behavior of lip, oral cavity, 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   Neoplasm of unspecified nature of bone, soft tissue, and skin    Nonspecific abnormal cardiovascular system function study 12/09/2012   Obesity with body mass index of 30.0-39.9 09/16/2015   Last Assessment & Plan:  Relevant Hx: Course: Daily Update: Today's Plan:she is doing the next 56 days and has lost 10 pounds of which she is very proud of as this is a lifestyle change for her and she is pleased with her efforts since she has never really  been able to lose weight before and is hopeful she will be able to walk some with the weight loss  Electronically signed by: Ruthell Rummage Brow   Occlusion and stenosis of carotid artery 12/09/2012   Occlusion and stenosis of multiple and bilateral precerebral arteries 12/09/2012   Osteopenia 09/16/2015   Last Assessment & Plan:  Relevant Hx: Course: Daily Update: Today's Plan:reviewed with her the dexa scan last done advised her of need to take calcium and vitamin d  Electronically signed by: Shannon Camel, NP 09/19/15 2042   Other abnormal glucose    Other and unspecified hyperlipidemia    Other malaise and fatigue    Other specified pre-operative examination 12/09/2012   Palpitations 10/05/2013   Pre-syncope 10/05/2013   Prediabetes 09/16/2015   Last Assessment & Plan:  Relevant Hx: Course: Daily Update: Today's Plan:her last level was stable for her at 5.9 and will continue to follow and hopefully will be lower for her with her weight loss  Electronically signed by: Shannon Camel, NP 11/16/15 1242   Routine gynecological examination    Shortness of breath 12/09/2012   Subfibular impingement of left lower extremity 06/12/2017   SVT (supraventricular tachycardia) ([place]) 05/23/2017   Symptomatic PVCs 05/23/2017   Symptoms involving cardiovascular system 12/09/2012   Unspecified functional disorder of intestine    Unspecified hypothyroidism    Unspecified menopausal and postmenopausal disorder    Urge incontinence    Vaginal enterocele, congenital or acquired    Vitamin D deficiency     Past Surgical History:  Procedure Laterality Date   CYSTOSCOPY     LEFT HEART CATH AND CORONARY ANGIOGRAPHY N/A 04/24/2021   Procedure: LEFT HEART CATH AND CORONARY ANGIOGRAPHY;  Surgeon: Burnell Blanks, MD;  Location: [facility] [facility];  Service: Cardiovascular;  Laterality: N/A;   REPLACEMENT TOTAL KNEE       {Home Medications (Optional):21181}  Inpatient  Medications: Scheduled Meds:  Continuous Infusions:  PRN Meds:   Allergies:    Allergies  Allergen Reactions   Bee Venom Anaphylaxis   Celecoxib Other (See Comments)    Abdominal pain    Iodine Hives   Sulfamethoxazole-Trimethoprim Other (See Comments)    Restless legs, involuntary movements    Codeine Palpitations   Iodinated Diagnostic Agents Rash   Nabumetone Other (See Comments) and Palpitations    Tachycardia     Social History:   Social History   Socioeconomic History   Marital status: Married    Spouse name: Not on file   Number of children: Not on file   Years of education: Not on file   Highest education level: Not on file  Occupational History   Not on file  Tobacco Use   Smoking status: Never   Smokeless tobacco: Never  Vaping Use   Vaping Use: Never used  Substance and Sexual Activity   Alcohol use: No   Drug use: No   Sexual  activity: Not on file  Other Topics Concern   Not on file  Social History Narrative   Not on file   Social Determinants of Health   Financial Resource Strain: Not on file  Food Insecurity: Not on file  Transportation Needs: Not on file  Physical Activity: Not on file  Stress: Not on file  Social Connections: Not on file  Intimate Partner Violence: Not on file    Family History:   *** Family History  Problem Relation Age of Onset   Heart attack Mother    Clotting disorder Father    Heart Problems Sister    Diabetes Sister    Diabetes Brother      ROS:  Review of Systems: [y] = yes, [ ]  = no      General: Weight gain [ ] ; Weight loss [ ] ; Anorexia [ ] ; Fatigue [ ] ; Fever [ ] ; Chills [ ] ; Weakness [ ]    Cardiac: Chest pain/pressure [ ] ; Resting SOB [ ] ; Exertional SOB [ ] ; Orthopnea [ ] ; Pedal Edema [ ] ; Palpitations [ ] ; Syncope [ ] ; Presyncope [ ] ; Paroxysmal nocturnal dyspnea [ ]    Pulmonary: Cough [ ] ; Wheezing [ ] ; Hemoptysis [ ] ; Sputum [ ] ; Snoring [ ]    GI: Vomiting [ ] ; Dysphagia [ ] ; Melena [ ] ;  Hematochezia [ ] ; Heartburn [ ] ; Abdominal pain [ ] ; Constipation [ ] ; Diarrhea [ ] ; BRBPR [ ]    GU: Hematuria [ ] ; Dysuria [ ] ; Nocturia [ ]  Vascular: Pain in legs with walking [ ] ; Pain in feet with lying flat [ ] ; Non-healing sores [ ] ; Stroke [ ] ; TIA [ ] ; Slurred speech [ ] ;   Neuro: Headaches [ ] ; Vertigo [ ] ; Seizures [ ] ; Paresthesias [ ] ;Blurred vision [ ] ; Diplopia [ ] ; Vision changes [ ]    Ortho/Skin: Arthritis [ ] ; Joint pain [ ] ; Muscle pain [ ] ; Joint swelling [ ] ; Back Pain [ ] ; Rash [ ]    Psych: Depression [ ] ; Anxiety [ ]    Heme: Bleeding problems [ ] ; Clotting disorders [ ] ; Anemia [ ]    Endocrine: Diabetes [ ] ; Thyroid dysfunction [ ]    Physical Exam/Data:   Vitals:   04/28/21 1933 04/28/21 1934  Temp: 98.7 F (37.1 C)   TempSrc: Oral   SpO2: 99%   Weight:  92.1 kg  Height:  5\' 2"  (1.575 m)   No intake or output data in the 24 hours ending 04/28/21 2102 Last 3 Weights 04/28/2021 04/27/2021 04/26/2021  Weight (lbs) 203 lb 1.6 oz 203 lb 1.6 oz 203 lb 8 oz  Weight (kg) 92.126 kg 92.126 kg 92.307 kg     Body mass index is 37.15 kg/m.  General:  Well nourished, well developed, in no acute distress*** HEENT: normal Lymph: no adenopathy Neck: no JVD Endocrine:  No thryomegaly Vascular: No carotid bruits; FA pulses 2+ bilaterally without bruits  Cardiac:  normal S1, S2; RRR; no murmur *** Lungs:  clear to auscultation bilaterally, no wheezing, rhonchi or rales  Abd: soft, nontender, no hepatomegaly  Ext: no edema Musculoskeletal:  No deformities, BUE and BLE strength normal and equal Skin: warm and dry  Neuro:  CNs 2-12 intact, no focal abnormalities noted Psych:  Normal affect   EKG:  The EKG was personally reviewed and demonstrates:  *** Telemetry:  Telemetry was personally reviewed and demonstrates:  ***  Relevant CV Studies: ***  Laboratory Data:  High Sensitivity Troponin:   Recent Labs  Lab  04/23/21 1303 04/24/21 0102 04/24/21 0859 04/24/21 0926  04/28/21 1941  TROPONINIHS 7 210* 475* 478* 79*     Chemistry Recent Labs  Lab 04/25/21 0442 04/26/21 0338 04/28/21 1941  NA 139 138 136  K 4.2 4.3 4.3  CL 109 104 105  CO2 20* 25 22  GLUCOSE 129* 114* 118*  BUN 15 16 21   CREATININE 0.73 0.77 0.83  CALCIUM 8.8* 9.1 9.2  GFRNONAA >60 >60 >60  ANIONGAP 10 9 9     No results for input(s): PROT, ALBUMIN, AST, ALT, ALKPHOS, BILITOT in the last 168 hours. Hematology Recent Labs  Lab 04/26/21 0338 04/27/21 0038 04/28/21 1941  WBC 8.9 8.5 8.6  RBC 4.80 4.14 4.65  HGB 16.0* 13.7 15.2*  HCT 47.1* 40.5 46.1*  MCV 98.1 97.8 99.1  MCH 33.3 33.1 32.7  MCHC 34.0 33.8 33.0  RDW 13.3 13.6 13.3  PLT 272 249 246   BNPNo results for input(s): BNP, PROBNP in the last 168 hours.  DDimer  Recent Labs  Lab 04/23/21 1303  DDIMER 1.34*    Radiology/Studies:  No results found. { If the patient is being seen for chest pain, [place], NSTEMI or STEMI Press F2 to calculate a risk score         :[id number]}   {Chest Pain/ACS Risk Score        :0488891694}   Assessment and Plan:   ***  {Are we signing off today?:[id number]}  For questions or updates, please contact [place] Please consult [email or web address] for contact info under    Signed, Dion Body, MD  04/28/2021 9:02 PM

## 2021-04-28 NOTE — ED Provider Notes (Signed)
[facility] EMERGENCY DEPARTMENT Provider Note   CSN: [id number] Arrival date & time: 04/28/21  1927     History Chief Complaint  Patient presents with   Chest Pain    Shannon Walker is a 87 y.o. female.  HPI    87 year old female comes in with chief complaint of chest pain. Patient has history of known coronary artery disease.  Discharged from the hospital with NSTEMI recently.  She is noted to have multivessel disease, however patient is medically being managed.  Patient driving chest pain around 5 PM.  Chest pain was midsternal, left-sided and radiating to her shoulder blade.  She had associated shortness of breath.  She took nitroglycerin, 30 or 40 minutes later her pain subsided. Patient no longer is having chest pain.  She received full dose aspirin by EMS.  Past Medical History:  Diagnosis Date   Acquired hypothyroidism 09/16/2015   Last Assessment & Plan:  Relevant Hx: Course: Daily Update: Today's Plan:continue to follow her reviewed her last level for her  Electronically signed by: Mayer Camel, NP 11/16/15 1241   Allergic rhinitis due to pollen 11/02/2011   Aneurysm of abdominal aorta    Arthritis, midfoot 09/16/2015   Bilateral carotid artery stenosis 12/09/2012   Calculus of kidney    Chest pain 12/09/2012   Coronary artery disease    Cystocele, lateral    Cystocele, midline    Cystocele, unspecified (CODE) 09/16/2015   Disorder of bone and cartilage, unspecified    Dyspnea and respiratory abnormality 12/09/2012   Edema of left foot 06/12/2017   Elevated blood pressure reading 12/09/2012   Encounter for long-term (current) use of aspirin 12/09/2012   Encounter for long-term (current) use of other medications 12/09/2012   Headache 12/09/2012   Hematuria, unspecified    HTN (hypertension) 10/05/2013   Hypertension    Left foot pain 06/12/2017   Localized osteoarthrosis not specified whether primary or secondary,  unspecified site    Malaise and fatigue 12/09/2012   Last Assessment & Plan:  Relevant Hx: Course: Daily Update: Today's Plan:she is feeling her energy is returning for her of which she is really pleased with  Electronically signed by: Mayer Camel, NP 11/16/15 1247   Menopausal disorder 09/16/2015   Last Assessment & Plan:  Relevant Hx: Course: Daily Update: Today's Plan:this is stable for her and discussed UTI and relationship with lack of estrogen her UA here today was clear for her which she was pleased with  Electronically signed by: Mayer Camel, NP 11/16/15 1246   Mixed hyperlipidemia 09/16/2015   Last Assessment & Plan:  Relevant Hx: Course: Daily Update: Today's Plan:plan to see her lipids when she returns in September and update them then  Electronically signed by: Mayer Camel, NP 11/16/15 1246   Morbid obesity ([place])    Neoplasm of uncertain behavior of lip, oral cavity, 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   Neoplasm of unspecified nature of bone, soft tissue, and skin    Nonspecific abnormal cardiovascular system function study 12/09/2012   Obesity with body mass index of 30.0-39.9 09/16/2015   Last Assessment & Plan:  Relevant Hx: Course: Daily Update: Today's Plan:she is doing the next 56 days and has lost 10 pounds of which she is very proud of as this is a lifestyle change for her and she is pleased with her efforts since she has never really been able to lose weight before and is hopeful she will be able to walk some with  the weight loss  Electronically signed by: Ruthell Rummage Brow   Occlusion and stenosis of carotid artery 12/09/2012   Occlusion and stenosis of multiple and bilateral precerebral arteries 12/09/2012   Osteopenia 09/16/2015   Last Assessment & Plan:  Relevant Hx: Course: Daily Update: Today's Plan:reviewed with her the dexa scan last done advised her of need to take calcium and vitamin d  Electronically signed by: Mayer Camel, NP 09/19/15 2042   Other abnormal glucose    Other and unspecified hyperlipidemia    Other malaise and fatigue    Other specified pre-operative examination 12/09/2012   Palpitations 10/05/2013   Pre-syncope 10/05/2013   Prediabetes 09/16/2015   Last Assessment & Plan:  Relevant Hx: Course: Daily Update: Today's Plan:her last level was stable for her at 5.9 and will continue to follow and hopefully will be lower for her with her weight loss  Electronically signed by: Mayer Camel, NP 11/16/15 1242   Routine gynecological examination    Shortness of breath 12/09/2012   Subfibular impingement of left lower extremity 06/12/2017   SVT (supraventricular tachycardia) ([place]) 05/23/2017   Symptomatic PVCs 05/23/2017   Symptoms involving cardiovascular system 12/09/2012   Unspecified functional disorder of intestine    Unspecified hypothyroidism    Unspecified menopausal and postmenopausal disorder    Urge incontinence    Vaginal enterocele, congenital or acquired    Vitamin D deficiency     Patient Active Problem List   Diagnosis Date Noted   Unstable angina ([place]) 04/28/2021   NSTEMI (non-ST elevated myocardial infarction) ([place]) 04/24/2021   Angina pectoris ([place]) 03/20/2021   Acute right ankle pain 08/23/2020   Vaginal enterocele, congenital or acquired    Morbid obesity ([place])    Other abnormal glucose    Aneurysm of abdominal aorta    Calculus of kidney    Cystocele, lateral    Cystocele, midline    Aortic atherosclerosis ([place]) 05/16/2020   Primary osteoarthritis involving multiple joints 05/16/2020   Idiopathic chronic gout 02/05/2020   Urethral caruncle 10/28/2019   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   Carpal tunnel syndrome of right wrist 06/22/2019   Pain in right hand 05/07/2019   Mild episode of recurrent major depressive disorder ([place]) 12/06/2017   Gastroesophageal reflux disease without esophagitis 08/23/2017   Anxiety 08/21/2017   Edema of left foot  06/12/2017   Left foot pain 06/12/2017   Subfibular impingement of left lower extremity 06/12/2017   SVT (supraventricular tachycardia) ([place]) 05/23/2017   Symptomatic PVCs 05/23/2017   Urge incontinence 05/11/2016   Acquired hypothyroidism 09/16/2015   Arthritis, midfoot 09/16/2015   Cystocele, unspecified (CODE) 09/16/2015   Menopausal disorder 09/16/2015   Mixed hyperlipidemia 09/16/2015   Obesity with body mass index of 30.0-39.9 09/16/2015   Prediabetes 09/16/2015   Osteopenia 09/16/2015   Tumor of soft tissue of neck 09/16/2015   Vitamin D deficiency 09/16/2015   Pre-syncope 10/05/2013   Palpitations 10/05/2013   HTN (hypertension) 10/05/2013   Headache 12/09/2012   Bilateral carotid artery stenosis 12/09/2012   Chest pain 12/09/2012   Dyspnea and respiratory abnormality 12/09/2012   Shortness of breath 12/09/2012   Elevated blood pressure reading 12/09/2012   Encounter for long-term (current) use of aspirin 12/09/2012   Malaise and fatigue 12/09/2012   Nonspecific abnormal cardiovascular system function study 12/09/2012   Occlusion and stenosis of carotid artery 12/09/2012   Occlusion and stenosis of multiple and bilateral precerebral arteries 12/09/2012   Encounter for long-term (current) use of other medications  12/09/2012   Other specified pre-operative examination 12/09/2012   Symptoms involving cardiovascular system 12/09/2012   Essential hypertension 12/09/2012   Allergic rhinitis due to pollen 11/02/2011   Neoplasm of uncertain behavior of lip, oral cavity, 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    Past Surgical History:  Procedure Laterality Date   CYSTOSCOPY     LEFT HEART CATH AND CORONARY ANGIOGRAPHY N/A 04/24/2021   Procedure: LEFT HEART CATH AND CORONARY ANGIOGRAPHY;  Surgeon: Burnell Blanks, MD;  Location: [place] [facility];  Service: Cardiovascular;  Laterality: N/A;   REPLACEMENT TOTAL KNEE       OB History   No obstetric history on file.     Family  History  Problem Relation Age of Onset   Heart attack Mother    Clotting disorder Father    Heart Problems Sister    Diabetes Sister    Diabetes Brother     Social History   Tobacco Use   Smoking status: Never   Smokeless tobacco: Never  Vaping Use   Vaping Use: Never used  Substance Use Topics   Alcohol use: No   Drug use: No    Home Medications Prior to Admission medications   Medication Sig Start Date End Date Taking? Authorizing Provider  aspirin EC 81 MG tablet Take 81 mg by mouth every other day.   Yes [provider]  isosorbide mononitrate (IMDUR) 30 MG 24 hr tablet Take 1 tablet (30 mg total) by mouth daily. 04/27/21 07/26/21 Yes Ghimire, Henreitta Leber, MD  levothyroxine (SYNTHROID) 50 MCG tablet Take 50 mcg by mouth daily before breakfast.   Yes [provider]  metoprolol tartrate (LOPRESSOR) 25 MG tablet Take 1 tablet (25 mg total) by mouth 2 (two) times daily. 04/27/21  Yes Ghimire, Henreitta Leber, MD  nitroGLYCERIN (NITROSTAT) 0.3 MG SL tablet Place 1 tablet (0.3 mg total) under the tongue as needed for chest pain. 04/27/21  Yes Ghimire, Henreitta Leber, MD  rosuvastatin (CRESTOR) 20 MG tablet Take 1 tablet (20 mg total) by mouth daily. 04/28/21  Yes Ghimire, Henreitta Leber, MD  ALPRAZolam Duanne Moron) 0.5 MG tablet Take 0.25 mg by mouth every 8 (eight) hours as needed for anxiety. 01/23/18   [provider]  cyanocobalamin 100 MCG tablet Take 1 tablet by mouth daily.    [provider]  Garlic 828 MG CAPS Take 500 mg by mouth daily.    [provider]  Magnesium Gluconate 550 MG TABS Take 1 capsule by mouth daily.    [provider]  Omega-3 1000 MG CAPS Take 1 tablet by mouth daily.    [provider]  predniSONE (DELTASONE) 50 MG tablet Take one tablet 13 hours, 7 hours, and 1 hour prior to your cardiac CT Patient not taking: Reported on 04/23/2021 03/29/21   Richardo Priest, MD  Vitamin D, Cholecalciferol, 1000 units CAPS Take 2,000  Units by mouth daily.     [provider]    Allergies    Bee venom, Celecoxib, Iodine, Sulfamethoxazole-trimethoprim, Codeine, Iodinated diagnostic agents, and Nabumetone  Review of Systems   Review of Systems  Constitutional:  Positive for activity change.  Respiratory:  Positive for shortness of breath.   Cardiovascular:  Positive for chest pain.  Hematological:  Does not bruise/bleed easily.  All other systems reviewed and are negative.  Physical Exam Updated Vital Signs BP 134/72   Pulse 71   Temp 98.7 F (37.1 C) (Oral)   Resp 16   Ht 5\' 2"  (  1.575 m)   Wt 92.1 kg   SpO2 97%   BMI 37.15 kg/m   Physical Exam Vitals and nursing note reviewed.  Constitutional:      Appearance: She is well-developed.  HENT:     Head: Atraumatic.  Cardiovascular:     Rate and Rhythm: Normal rate.  Pulmonary:     Effort: Pulmonary effort is normal.     Breath sounds: Normal breath sounds.  Musculoskeletal:     Cervical back: Normal range of motion and neck supple.  Skin:    General: Skin is warm and dry.  Neurological:     Mental Status: She is alert and oriented to person, place, and time.    ED Results / Procedures / Treatments   Labs (all labs ordered are listed, but only abnormal results are displayed) Labs Reviewed  BASIC METABOLIC PANEL - Abnormal; Notable for the following components:      Result Value   Glucose, Bld 118 (*)    All other components within normal limits  CBC - Abnormal; Notable for the following components:   Hemoglobin 15.2 (*)    HCT 46.1 (*)    All other components within normal limits  TROPONIN I (HIGH SENSITIVITY) - Abnormal; Notable for the following components:   Troponin I (High Sensitivity) 79 (*)    All other components within normal limits  TROPONIN I (HIGH SENSITIVITY) - Abnormal; Notable for the following components:   Troponin I (High Sensitivity) 100 (*)    All other components within normal limits  RESP PANEL BY RT-PCR (FLU  A&B, COVID) ARPGX2  PROTIME-INR  CBG MONITORING, ED    EKG EKG Interpretation  Date/Time:  Friday April 28 2021 19:32:38 EST Ventricular Rate:  93 PR Interval:  145 QRS Duration: 83 QT Interval:  354 QTC Calculation: 441 R Axis:   -28 Text Interpretation: Sinus rhythm Borderline left axis deviation Low voltage, precordial leads Borderline T wave abnormalities No acute changes  mild ST depression in inferior leads Confirmed by Varney Biles [id number]) on 04/28/2021 7:38:49 PM  Radiology No results found.  Procedures .Critical Care Performed by: Varney Biles, MD Authorized by: Varney Biles, MD   Critical care provider statement:    Critical care time (minutes):  32   Critical care was necessary to treat or prevent imminent or life-threatening deterioration of the following conditions:  Cardiac failure   Critical care was time spent personally by me on the following activities:  Development of treatment plan with patient or surrogate, discussions with consultants, evaluation of patient's response to treatment, examination of patient, ordering and review of laboratory studies, ordering and review of radiographic studies, ordering and performing treatments and interventions, pulse oximetry, re-evaluation of patient's condition and review of old charts   Medications Ordered in ED Medications  isosorbide mononitrate (IMDUR) 24 hr tablet 30 mg (has no administration in time range)  isosorbide mononitrate (IMDUR) 24 hr tablet 60 mg (has no administration in time range)  metoprolol tartrate (LOPRESSOR) tablet 25 mg (has no administration in time range)  rosuvastatin (CRESTOR) tablet 20 mg (has no administration in time range)  levothyroxine (SYNTHROID) tablet 50 mcg (has no administration in time range)  aspirin EC tablet 81 mg (has no administration in time range)  nitroGLYCERIN (NITROSTAT) SL tablet 0.4 mg (has no administration in time range)  acetaminophen (TYLENOL) tablet 650  mg (has no administration in time range)  ondansetron (ZOFRAN) injection 4 mg (has no administration in time range)    ED  Course  I have reviewed the triage vital signs and the nursing notes.  Pertinent labs & imaging results that were available during my care of the patient were reviewed by me and considered in my medical decision making (see chart for details).    MDM Rules/Calculators/A&P                           Pt comes in with cc of chest pain.  Pt has hx of multivessel coronary artery disease.  Her chest pain sounds typical of ACS.  We will consult cardiology service.  This time she is chest pain-free.  Concern for NSTEMI  11:48 PM Troponins are elevated and rising.  Cardiology service has seen the patient.  They had informed me that they will start heparin if the troponin is rising, given that the initial troponin is lower than the troponin level at the time of discharge from previous admission..   Final Clinical Impression(s) / ED Diagnoses Final diagnoses:  ACS (acute coronary syndrome) [facility])    Rx / DC Orders ED Discharge Orders     None        Varney Biles, MD 04/28/21 2348

## 2021-04-28 NOTE — ED Triage Notes (Signed)
PT states acute onset chest pain that radiates to her L scapula - when she moves to a staning position. PT was discharged yesterday from [facility] for same. PT took 2 SL nitro with relief.  Given 324 chewable asa en-route.  Bp 160/96 HR 79 O2 95% RR 16

## 2021-04-29 ENCOUNTER — Encounter (HOSPITAL_COMMUNITY): Payer: Self-pay | Admitting: Cardiology

## 2021-04-29 DIAGNOSIS — E039 Hypothyroidism, unspecified: Secondary | ICD-10-CM

## 2021-04-29 DIAGNOSIS — I214 Non-ST elevation (NSTEMI) myocardial infarction: Secondary | ICD-10-CM

## 2021-04-29 DIAGNOSIS — I209 Angina pectoris, unspecified: Secondary | ICD-10-CM

## 2021-04-29 DIAGNOSIS — E785 Hyperlipidemia, unspecified: Secondary | ICD-10-CM

## 2021-04-29 DIAGNOSIS — I10 Essential (primary) hypertension: Secondary | ICD-10-CM

## 2021-04-29 DIAGNOSIS — I200 Unstable angina: Secondary | ICD-10-CM

## 2021-04-29 LAB — TROPONIN I (HIGH SENSITIVITY): Troponin I (High Sensitivity): 87 ng/L — ABNORMAL HIGH (ref <18)

## 2021-04-29 LAB — MRSA NEXT GEN BY PCR, NASAL: MRSA by PCR Next Gen: NOT DETECTED

## 2021-04-29 LAB — RESP PANEL BY RT-PCR (FLU A&B, COVID) ARPGX2
Influenza A by PCR: NEGATIVE
Influenza B by PCR: NEGATIVE
SARS Coronavirus 2 by RT PCR: NEGATIVE

## 2021-04-29 MED ORDER — CLOPIDOGREL BISULFATE 300 MG PO TABS
300.0000 mg | ORAL_TABLET | Freq: Once | ORAL | Status: AC
  Administered 2021-04-29: 300 mg via ORAL
  Filled 2021-04-29: qty 1

## 2021-04-29 MED ORDER — ENOXAPARIN SODIUM 100 MG/ML IJ SOSY
90.0000 mg | PREFILLED_SYRINGE | Freq: Two times a day (BID) | INTRAMUSCULAR | Status: DC
  Administered 2021-04-29 – 2021-04-30 (×4): 90 mg via SUBCUTANEOUS
  Filled 2021-04-29 (×4): qty 0.9

## 2021-04-29 MED ORDER — CLOPIDOGREL BISULFATE 75 MG PO TABS
75.0000 mg | ORAL_TABLET | Freq: Every day | ORAL | Status: DC
  Administered 2021-04-30: 09:00:00 75 mg via ORAL
  Filled 2021-04-29: qty 1

## 2021-04-29 MED ORDER — METOPROLOL TARTRATE 50 MG PO TABS
50.0000 mg | ORAL_TABLET | Freq: Two times a day (BID) | ORAL | Status: DC
  Administered 2021-04-29 – 2021-04-30 (×2): 50 mg via ORAL
  Filled 2021-04-29 (×2): qty 1

## 2021-04-29 NOTE — ED Notes (Signed)
PT stated she felt sob.  Sats of 90%.  Re-positioned.  Sats immediately increased to 96% on RA.  Pt stated improved sob.

## 2021-04-29 NOTE — Progress Notes (Signed)
ANTICOAGULATION CONSULT NOTE - Initial Consult  Pharmacy Consult for Lovenox  Indication: chest pain/ACS  Allergies  Allergen Reactions   Bee Venom Anaphylaxis   Celecoxib Other (See Comments)    Abdominal pain    Iodine Hives   Sulfamethoxazole-Trimethoprim Other (See Comments)    Restless legs, involuntary movements    Codeine Palpitations   Iodinated Diagnostic Agents Rash   Nabumetone Other (See Comments) and Palpitations    Tachycardia     Patient Measurements: Height: 5\' 2"  (157.5 cm) Weight: 92.8 kg (204 lb 9.4 oz) IBW/kg (Calculated) : 50.1  Vital Signs: Temp: 98 F (36.7 C) (12/03 0210) Temp Source: Oral (12/03 0131) BP: 143/80 (12/03 0210) Pulse Rate: 85 (12/03 0210)  Labs: Recent Labs    04/26/21 0338 04/26/21 1303 04/27/21 0038 04/28/21 1941 04/28/21 2141 04/29/21 0128  HGB 16.0*  --  13.7 15.2*  --   --   HCT 47.1*  --  40.5 46.1*  --   --   PLT 272  --  249 246  --   --   LABPROT  --   --   --  12.8  --   --   INR  --   --   --  1.0  --   --   HEPARINUNFRC 0.23* 0.42 0.45  --   --   --   CREATININE 0.77  --   --  0.83  --   --   TROPONINIHS  --   --   --  79* 100* 87*    Estimated Creatinine Clearance: 51.6 mL/min (by C-G formula based on SCr of 0.83 mg/dL).   Medical History: Past Medical History:  Diagnosis Date   Acquired hypothyroidism 09/16/2015   Last Assessment & Plan:  Relevant Hx: Course: Daily Update: Today's Plan:continue to follow her reviewed her last level for her  Electronically signed by: Mayer Camel, NP 11/16/15 1241   Allergic rhinitis due to pollen 11/02/2011   Aneurysm of abdominal aorta    Arthritis, midfoot 09/16/2015   Bilateral carotid artery stenosis 12/09/2012   Calculus of kidney    Chest pain 12/09/2012   Coronary artery disease    Cystocele, lateral    Cystocele, midline    Cystocele, unspecified (CODE) 09/16/2015   Disorder of bone and cartilage, unspecified    Dyspnea and respiratory  abnormality 12/09/2012   Edema of left foot 06/12/2017   Elevated blood pressure reading 12/09/2012   Encounter for long-term (current) use of aspirin 12/09/2012   Encounter for long-term (current) use of other medications 12/09/2012   Headache 12/09/2012   Hematuria, unspecified    HTN (hypertension) 10/05/2013   Hypertension    Left foot pain 06/12/2017   Localized osteoarthrosis not specified whether primary or secondary, unspecified site    Malaise and fatigue 12/09/2012   Last Assessment & Plan:  Relevant Hx: Course: Daily Update: Today's Plan:she is feeling her energy is returning for her of which she is really pleased with  Electronically signed by: Mayer Camel, NP 11/16/15 1247   Menopausal disorder 09/16/2015   Last Assessment & Plan:  Relevant Hx: Course: Daily Update: Today's Plan:this is stable for her and discussed UTI and relationship with lack of estrogen her UA here today was clear for her which she was pleased with  Electronically signed by: Mayer Camel, NP 11/16/15 1246   Mixed hyperlipidemia 09/16/2015   Last Assessment & Plan:  Relevant Hx: Course: Daily Update: Today's Plan:plan to see her  lipids when she returns in September and update them then  Electronically signed by: Mayer Camel, NP 11/16/15 1246   Morbid obesity ([place])    Neoplasm of uncertain behavior of lip, oral cavity, 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   Neoplasm of unspecified nature of bone, soft tissue, and skin    Nonspecific abnormal cardiovascular system function study 12/09/2012   Obesity with body mass index of 30.0-39.9 09/16/2015   Last Assessment & Plan:  Relevant Hx: Course: Daily Update: Today's Plan:she is doing the next 56 days and has lost 10 pounds of which she is very proud of as this is a lifestyle change for her and she is pleased with her efforts since she has never really been able to lose weight before and is hopeful she will be able to walk some with the  weight loss  Electronically signed by: Ruthell Rummage Brow   Occlusion and stenosis of carotid artery 12/09/2012   Occlusion and stenosis of multiple and bilateral precerebral arteries 12/09/2012   Osteopenia 09/16/2015   Last Assessment & Plan:  Relevant Hx: Course: Daily Update: Today's Plan:reviewed with her the dexa scan last done advised her of need to take calcium and vitamin d  Electronically signed by: Mayer Camel, NP 09/19/15 2042   Other abnormal glucose    Other and unspecified hyperlipidemia    Other malaise and fatigue    Other specified pre-operative examination 12/09/2012   Palpitations 10/05/2013   Pre-syncope 10/05/2013   Prediabetes 09/16/2015   Last Assessment & Plan:  Relevant Hx: Course: Daily Update: Today's Plan:her last level was stable for her at 5.9 and will continue to follow and hopefully will be lower for her with her weight loss  Electronically signed by: Mayer Camel, NP 11/16/15 1242   Routine gynecological examination    Shortness of breath 12/09/2012   Subfibular impingement of left lower extremity 06/12/2017   SVT (supraventricular tachycardia) ([place]) 05/23/2017   Symptomatic PVCs 05/23/2017   Symptoms involving cardiovascular system 12/09/2012   Unspecified functional disorder of intestine    Unspecified hypothyroidism    Unspecified menopausal and postmenopausal disorder    Urge incontinence    Vaginal enterocele, congenital or acquired    Vitamin D deficiency     Assessment: 87 y/o F with ?repeat NSTEMI. Starting Lovenox. PTA meds reviewed. CBC/renal function good.   Goal of Therapy:  Monitor platelets by anticoagulation protocol: Yes   Plan:  Lovenox 1 mg/kg subcutaneous q12h Daily CBC Monitor for bleeding  Narda Bonds, PharmD, BCPS Clinical Pharmacist Phone: [phone]

## 2021-04-29 NOTE — Plan of Care (Signed)
  Problem: Clinical Measurements: Goal: Will remain free from infection Outcome: Progressing   Problem: Clinical Measurements: Goal: Respiratory complications will improve Outcome: Progressing   Problem: Clinical Measurements: Goal: Cardiovascular complication will be avoided Outcome: Progressing   

## 2021-04-29 NOTE — H&P (Signed)
Cardiology Admission History and Physical:   Patient ID: Shannon Walker MRN: [id number]; DOB: Feb 02, 1933   Admission date: 04/28/2021  Primary Care Provider: Mayer Camel, NP [facility] Cardiologist: Shirlee More, MD  [place] Electrophysiologist:  None   Chief Complaint: chest pain  Patient Profile:   Shannon Walker is a 87 y.o. female with severe CAD, HTN, HLD, carotid stenosis, hypothyroidism and angina who is seen for recurrent anginal sx following discharge on 04/27/21 for NSTEMI.   History of Present Illness:   Shannon Walker was admitted on 04/24/2021 for chest pain, shortness of breath and palpitations.  During that presentation she had left-sided chest pressure and pain that radiated to her left shoulder and upper arm with associated shortness of breath and mild nausea.  She had had similar symptoms at that in the past.  She underwent coronary angiography on 04/24/2021 and was found to have multiple significant lesions including a mid RCA 99% stenosis, ostial to distal left main 60% stenosis, mid LAD 99% stenosis, proximal LAD 60% stenosis in diagonal lesion.  While CABG would be optimal given her age, comorbidities, and significant calcification it was recommended that she first be trialed on medical therapy to see if her symptoms can be improved without putting her through surgery or the risk of PCI with atherectomy.  She was started on Imdur 30 mg daily.   Today she returns to the emergency department because she had 3 episodes of angina over the past 24 hours and had to take 2 nitroglycerin to relieve her symptoms.  Her first episode was 12/02 at 1:30 AM where she woke up from sleep at bilateral scapular pain but no chest pain.  She took 1 SL NG with symptom relief and went back to bed.  Upon waking up this morning she felt like her normal self until afternoon between 3-4 PM when she had 4-6/10 severity central chest pressure without radiation or associated  symptoms.  She took 1 nitroglycerin without symptom relief and then took another around 5 minutes later with improvement in symptoms.  By the time EMS arrived she had complete resolution of symptoms.  During my exam she was comfortable, chest pain/pressure free and not reporting any symptoms at all.  Vital signs were unremarkable with P 85, BP 134/72 (90), O2 98% on RA, RR 16.   She does feel that despite her recurrence of symptoms the severity and duration are nowhere near as bad as they were prior to her recent hospitalization discharge.  She said her pain was well above a 10/10 ("probably a 50") during her recent admission.  She denies any heart failure symptoms.  Her symptoms are unrelated to exertional activity and has been occurring at rest.  Troponins mildly elevated but not as high as recent admission (79->100), (478 on 04/24/21).   Past Medical History:  Diagnosis Date   Acquired hypothyroidism 09/16/2015   Last Assessment & Plan:  Relevant Hx: Course: Daily Update: Today's Plan:continue to follow her reviewed her last level for her  Electronically signed by: Mayer Camel, NP 11/16/15 1241   Allergic rhinitis due to pollen 11/02/2011   Aneurysm of abdominal aorta    Arthritis, midfoot 09/16/2015   Bilateral carotid artery stenosis 12/09/2012   Calculus of kidney    Chest pain 12/09/2012   Coronary artery disease    Cystocele, lateral    Cystocele, midline    Cystocele, unspecified (CODE) 09/16/2015   Disorder of bone and cartilage, unspecified  Dyspnea and respiratory abnormality 12/09/2012   Edema of left foot 06/12/2017   Elevated blood pressure reading 12/09/2012   Encounter for long-term (current) use of aspirin 12/09/2012   Encounter for long-term (current) use of other medications 12/09/2012   Headache 12/09/2012   Hematuria, unspecified    HTN (hypertension) 10/05/2013   Hypertension    Left foot pain 06/12/2017   Localized osteoarthrosis not specified  whether primary or secondary, unspecified site    Malaise and fatigue 12/09/2012   Last Assessment & Plan:  Relevant Hx: Course: Daily Update: Today's Plan:she is feeling her energy is returning for her of which she is really pleased with  Electronically signed by: Mayer Camel, NP 11/16/15 1247   Menopausal disorder 09/16/2015   Last Assessment & Plan:  Relevant Hx: Course: Daily Update: Today's Plan:this is stable for her and discussed UTI and relationship with lack of estrogen her UA here today was clear for her which she was pleased with  Electronically signed by: Mayer Camel, NP 11/16/15 1246   Mixed hyperlipidemia 09/16/2015   Last Assessment & Plan:  Relevant Hx: Course: Daily Update: Today's Plan:plan to see her lipids when she returns in September and update them then  Electronically signed by: Mayer Camel, NP 11/16/15 1246   Morbid obesity ([place])    Neoplasm of uncertain behavior of lip, oral cavity, 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   Neoplasm of unspecified nature of bone, soft tissue, and skin    Nonspecific abnormal cardiovascular system function study 12/09/2012   Obesity with body mass index of 30.0-39.9 09/16/2015   Last Assessment & Plan:  Relevant Hx: Course: Daily Update: Today's Plan:she is doing the next 56 days and has lost 10 pounds of which she is very proud of as this is a lifestyle change for her and she is pleased with her efforts since she has never really been able to lose weight before and is hopeful she will be able to walk some with the weight loss  Electronically signed by: Ruthell Rummage Brow   Occlusion and stenosis of carotid artery 12/09/2012   Occlusion and stenosis of multiple and bilateral precerebral arteries 12/09/2012   Osteopenia 09/16/2015   Last Assessment & Plan:  Relevant Hx: Course: Daily Update: Today's Plan:reviewed with her the dexa scan last done advised her of need to take calcium and vitamin d  Electronically  signed by: Mayer Camel, NP 09/19/15 2042   Other abnormal glucose    Other and unspecified hyperlipidemia    Other malaise and fatigue    Other specified pre-operative examination 12/09/2012   Palpitations 10/05/2013   Pre-syncope 10/05/2013   Prediabetes 09/16/2015   Last Assessment & Plan:  Relevant Hx: Course: Daily Update: Today's Plan:her last level was stable for her at 5.9 and will continue to follow and hopefully will be lower for her with her weight loss  Electronically signed by: Mayer Camel, NP 11/16/15 1242   Routine gynecological examination    Shortness of breath 12/09/2012   Subfibular impingement of left lower extremity 06/12/2017   SVT (supraventricular tachycardia) ([place]) 05/23/2017   Symptomatic PVCs 05/23/2017   Symptoms involving cardiovascular system 12/09/2012   Unspecified functional disorder of intestine    Unspecified hypothyroidism    Unspecified menopausal and postmenopausal disorder    Urge incontinence    Vaginal enterocele, congenital or acquired    Vitamin D deficiency    Past Surgical History:  Procedure Laterality Date   CYSTOSCOPY  LEFT HEART CATH AND CORONARY ANGIOGRAPHY N/A 04/24/2021   Procedure: LEFT HEART CATH AND CORONARY ANGIOGRAPHY;  Surgeon: Burnell Blanks, MD;  Location: [facility];  Service: Cardiovascular;  Laterality: N/A;   REPLACEMENT TOTAL KNEE      Medications Prior to Admission: Prior to Admission medications   Medication Sig Start Date End Date Taking? Authorizing Provider  aspirin EC 81 MG tablet Take 81 mg by mouth every other day.   Yes [provider]  isosorbide mononitrate (IMDUR) 30 MG 24 hr tablet Take 1 tablet (30 mg total) by mouth daily. 04/27/21 07/26/21 Yes Ghimire, Henreitta Leber, MD  levothyroxine (SYNTHROID) 50 MCG tablet Take 50 mcg by mouth daily before breakfast.   Yes [provider]  metoprolol tartrate (LOPRESSOR) 25 MG tablet Take 1 tablet (25 mg  total) by mouth 2 (two) times daily. 04/27/21  Yes Ghimire, Henreitta Leber, MD  nitroGLYCERIN (NITROSTAT) 0.3 MG SL tablet Place 1 tablet (0.3 mg total) under the tongue as needed for chest pain. 04/27/21  Yes Ghimire, Henreitta Leber, MD  rosuvastatin (CRESTOR) 20 MG tablet Take 1 tablet (20 mg total) by mouth daily. 04/28/21  Yes Ghimire, Henreitta Leber, MD  ALPRAZolam Duanne Moron) 0.5 MG tablet Take 0.25 mg by mouth every 8 (eight) hours as needed for anxiety. 01/23/18   [provider]  cyanocobalamin 100 MCG tablet Take 1 tablet by mouth daily.    [provider]  Garlic 696 MG CAPS Take 500 mg by mouth daily.    [provider]  Magnesium Gluconate 550 MG TABS Take 1 capsule by mouth daily.    [provider]  Omega-3 1000 MG CAPS Take 1 tablet by mouth daily.    [provider]  predniSONE (DELTASONE) 50 MG tablet Take one tablet 13 hours, 7 hours, and 1 hour prior to your cardiac CT Patient not taking: Reported on 04/23/2021 03/29/21   Richardo Priest, MD  Vitamin D, Cholecalciferol, 1000 units CAPS Take 2,000 Units by mouth daily.     [provider]    Allergies:    Allergies  Allergen Reactions   Bee Venom Anaphylaxis   Celecoxib Other (See Comments)    Abdominal pain    Iodine Hives   Sulfamethoxazole-Trimethoprim Other (See Comments)    Restless legs, involuntary movements    Codeine Palpitations   Iodinated Diagnostic Agents Rash   Nabumetone Other (See Comments) and Palpitations    Tachycardia    Social History:   Social History   Socioeconomic History   Marital status: Married    Spouse name: Not on file   Number of children: Not on file   Years of education: Not on file   Highest education level: Not on file  Occupational History   Not on file  Tobacco Use   Smoking status: Never   Smokeless tobacco: Never  Vaping Use   Vaping Use: Never used  Substance and Sexual Activity   Alcohol use: No   Drug use: No   Sexual activity:  Not on file  Other Topics Concern   Not on file  Social History Narrative   Not on file   Social Determinants of Health   Financial Resource Strain: Not on file  Food Insecurity: Not on file  Transportation Needs: Not on file  Physical Activity: Not on file  Stress: Not on file  Social Connections: Not on file  Intimate Partner Violence: Not on file    Family History:  The patient's family history includes Clotting disorder in her father; Diabetes in her brother and sister; Heart Problems in her sister; Heart attack in her mother.    ROS:   Review of Systems: [y] = yes, [ ]  = no   General: Weight gain [ ] ; Weight loss [ ] ; Anorexia [ ] ; Fatigue [ ] ; Fever [ ] ; Chills [ ] ; Weakness [ ]    Cardiac: Chest pain/pressure [y]; Resting SOB [ ] ; Exertional SOB [ ] ; Orthopnea [ ] ; Pedal Edema [ ] ; Palpitations [ ] ; Syncope [ ] ; Presyncope [ ] ; Paroxysmal nocturnal dyspnea [ ]    Pulmonary: Cough [ ] ; Wheezing [ ] ; Hemoptysis [ ] ; Sputum [ ] ; Snoring [ ]    GI: Vomiting [ ] ; Dysphagia [ ] ; Melena [ ] ; Hematochezia [ ] ; Heartburn [ ] ; Abdominal pain [ ] ; Constipation [ ] ; Diarrhea [ ] ; BRBPR [ ]    GU: Hematuria [ ] ; Dysuria [ ] ; Nocturia [ ]  Vascular: Pain in legs with walking [ ] ; Pain in feet with lying flat [ ] ; Non-healing sores [ ] ; Stroke [ ] ; TIA [ ] ; Slurred speech [ ] ;   Neuro: Headaches [ ] ; Vertigo [ ] ; Seizures [ ] ; Paresthesias [ ] ;Blurred vision [ ] ; Diplopia [ ] ; Vision changes [ ]    Ortho/Skin: Arthritis [ ] ; Joint pain [ ] ; Muscle pain [ ] ; Joint swelling [ ] ; Back Pain [ ] ; Rash [ ]    Psych: Depression [ ] ; Anxiety [ ]    Heme: Bleeding problems [ ] ; Clotting disorders [ ] ; Anemia [ ]    Endocrine: Diabetes [ ] ; Thyroid dysfunction [ ]    Physical Exam/Data:   Vitals:   04/28/21 2030 04/28/21 2100 04/28/21 2130 04/28/21 2200  BP: (!) 148/80 136/82 130/70 134/72  Pulse: 91 71 79 71  Resp: 15 18 17 16   Temp:      TempSrc:      SpO2: 98% 99% 95% 97%  Weight:      Height:        No intake or output data in the 24 hours ending 04/29/21 0036 Last 3 Weights 04/28/2021 04/27/2021 04/26/2021  Weight (lbs) 203 lb 1.6 oz 203 lb 1.6 oz 203 lb 8 oz  Weight (kg) 92.126 kg 92.126 kg 92.307 kg     Body mass index is 37.15 kg/m.  General:  Well nourished, well developed, in no acute distress HEENT: normal Lymph: no adenopathy Neck: no JVD Endocrine:  No thryomegaly Vascular: No carotid bruits; FA pulses 2+ bilaterally without bruits  Cardiac:  normal S1, S2; RRR; no murmur  Lungs:  clear to auscultation bilaterally, no wheezing, rhonchi or rales  Abd: soft, nontender, no hepatomegaly  Ext: no edema Musculoskeletal:  No deformities, BUE and BLE strength normal and equal Skin: warm and dry  Neuro:  CNs 2-12 intact, no focal abnormalities noted Psych:  Normal affect   EKG:  The ECG that was done 04/28/21 (19:32:38) was personally reviewed and demonstrates NSR without new ischemic changes, NSR 93, PR 145, QRS 83, QTc 441  Relevant CV Studies:  TTE Result date: 04/24/21  1. Global longitudinal strain is -15.2%. Left ventricular ejection  fraction, by estimation, is 65 to 70%. The left ventricle has normal  function. The left ventricle has no regional wall motion abnormalities.  There is mild left ventricular hypertrophy.  Left ventricular diastolic parameters are indeterminate.   2. Right ventricular systolic function is normal. The right ventricular  size is normal.   3. Trivial mitral valve regurgitation.  Moderate mitral annular  calcification.   4. Aortic valve regurgitation is not visualized. Aortic valve sclerosis  is present, with no evidence of aortic valve stenosis.   5. The inferior vena cava is normal in size with greater than 50%  respiratory variability, suggesting right atrial pressure of 3 mmHg.   Coronary angiography Result date: 04/24/21   Mid RCA lesion is 99% stenosed.   Prox RCA lesion is 40% stenosed.   Ost LM to Dist LM lesion is 60%  stenosed.   Dist LM to Prox LAD lesion is 60% stenosed.   Mid LAD lesion is 99% stenosed.   1st Diag lesion is 60% stenosed. Moderate to severe distal left main stenosis Moderate, heavily calcified ostial LAD stenosis. Severe heavily calcified mid LAD stenosis just past the takeoff of the diagonal branch. The diagonal branch has diffuse moderate stenosis. Mild non-obstructive disease in the Circumflex.  The RCA is a large dominant vessel with heavy calcification in the proximal and mid vessel. The mid RCA has a severe, heavily calcified stenosis that is a functional CTO. The distal RCA branches fill from antegrade flow and from left to right collaterals.  LVEDP 7 mmHg  Laboratory Data:  High Sensitivity Troponin:   Recent Labs  Lab 04/24/21 0102 04/24/21 0859 04/24/21 0926 04/28/21 1941 04/28/21 2141  TROPONINIHS 210* 475* 478* 79* 100*      Chemistry Recent Labs  Lab 04/26/21 0338 04/28/21 1941  NA 138 136  K 4.3 4.3  CL 104 105  CO2 25 22  GLUCOSE 114* 118*  BUN 16 21  CREATININE 0.77 0.83  CALCIUM 9.1 9.2  GFRNONAA >60 >60  ANIONGAP 9 9    No results for input(s): PROT, ALBUMIN, AST, ALT, ALKPHOS, BILITOT in the last 168 hours. Hematology Recent Labs  Lab 04/27/21 0038 04/28/21 1941  WBC 8.5 8.6  RBC 4.14 4.65  HGB 13.7 15.2*  HCT 40.5 46.1*  MCV 97.8 99.1  MCH 33.1 32.7  MCHC 33.8 33.0  RDW 13.6 13.3  PLT 249 246   BNPNo results for input(s): BNP, PROBNP in the last 168 hours.  DDimer  Recent Labs  Lab 04/23/21 1303  DDIMER 1.34*   Radiology/Studies:  No results found.  TIMI Risk Score for Unstable Angina or Non-ST Elevation MI:   The patient's TIMI risk score is 6, which indicates a 41% risk of all cause mortality, new or recurrent myocardial infarction or need for urgent revascularization in the next 14 days.{  Assessment and Plan:   Angina NSTEMI Patient presents back with recurrent anginal symptoms with recent hospital discharge and  severe CAD.  She has a mild troponin elevation however it was much higher at recent hospitalization.  Initially thought this was continuing to downtrend from over 400 a few days ago although it is mildly uptrending again.  Her symptoms are typical to their she has had before recent hospitalization but not as severe.  We discussed risk and benefits of considering repeat coronary angiography with extensive calcified disease versus continued up titration of antianginals with long-acting nitrates.  Since she will be here over the weekend either way we will plan to uptitrate her nitrates and see if she gets complete symptom relief for considering repeat coronary angiography.  If her symptoms or not well controlled then would consider high risk PCI on Monday. - s/p repeat ASA load en route, continue ASA 81 mg daily - last dose imdur 12/02 AM, increase imdur 30 mg daily to 60 mg daily,  additional 30 mg given 12/02 PM  - continue metoprolol tartrate 25 mg bid  - consider P2Y12i in the morning as she doesn't appear to be CABG candidate per chart review, either way should be on this at least a year since she already had a true NSTEMI this past week and ?repeat NSTEMI now although hsT not very impressive but uptrending - lovenox ACS dosing x48h - TTE 04/24/21 with nl fxn   HTN - well controlled, increase imdur as above, BP room to uptitrate   Hypothyroidism - continue synthroid 50 mcg qAM    HLD - continue rosuvastatin 20 mg daily   Severity of Illness: The appropriate patient status for this patient is INPATIENT. Inpatient status is judged to be reasonable and necessary in order to provide the required intensity of service to ensure the patient's safety. The patient's presenting symptoms, physical exam findings, and initial radiographic and laboratory data in the context of their chronic comorbidities is felt to place them at high risk for further clinical deterioration. Furthermore, it is not anticipated  that the patient will be medically stable for discharge from the hospital within 2 midnights of admission.   * I certify that at the point of admission it is my clinical judgment that the patient will require inpatient hospital care spanning beyond 2 midnights from the point of admission due to high intensity of service, high risk for further deterioration and high frequency of surveillance required.*   For questions or updates, please contact [place] Please consult [email or web address] for contact info under   Signed, Dion Body, MD  04/29/2021 12:36 AM

## 2021-04-29 NOTE — Progress Notes (Signed)
Progress Note  Patient Name: Shannon Walker Date of Encounter: 04/29/2021  [facility] Cardiologist: Shirlee More, MD    Subjective   87 year old female with three-vessel coronary artery disease.  Given her comorbidities medical therapy has been recommended.  She was recently admitted to the hospital with a non-ST segment elevation myocardial infarction.  She was started on isosorbide.  She returned to the hospital several days later because of recurrent chest pain.  She comments that the chest pain is not as severe as it was the prior week.  Troponins are mildly elevated but not as high as they were during the previous admission.  Inpatient Medications    Scheduled Meds:  aspirin EC  81 mg Oral Daily   enoxaparin (LOVENOX) injection  90 mg Subcutaneous Q12H   isosorbide mononitrate  60 mg Oral Daily   levothyroxine  50 mcg Oral QAC breakfast   metoprolol tartrate  25 mg Oral BID   rosuvastatin  20 mg Oral Daily   Continuous Infusions:  PRN Meds: acetaminophen, nitroGLYCERIN, ondansetron (ZOFRAN) IV   Vital Signs    Vitals:   04/29/21 0210 04/29/21 0300 04/29/21 0500 04/29/21 0811  BP: (!) 143/80  107/68 126/76  Pulse: 85  69   Resp: 15  17   Temp: 98 F (36.7 C) 97.8 F (36.6 C)    TempSrc:  Oral    SpO2: 96%  90%   Weight:      Height:        Intake/Output Summary (Last 24 hours) at 04/29/2021 0915 Last data filed at 04/29/2021 0813 Gross per 24 hour  Intake 240 ml  Output --  Net 240 ml   Last 3 Weights 04/29/2021 04/28/2021 04/27/2021  Weight (lbs) 204 lb 9.4 oz 203 lb 1.6 oz 203 lb 1.6 oz  Weight (kg) 92.8 kg 92.126 kg 92.126 kg      Telemetry    Sinus  - 90s - Personally Reviewed  ECG     - Personally Reviewed  Physical Exam   GEN: elderly , frail female, NAD   Neck: No JVD Cardiac: RRR, no murmurs, rubs, or gallops.  Respiratory: Clear to auscultation bilaterally. GI: Soft, nontender, non-distended  MS: No edema; No deformity. Neuro:   Nonfocal  Psych: Normal affect   Labs    High Sensitivity Troponin:   Recent Labs  Lab 04/24/21 0859 04/24/21 0926 04/28/21 1941 04/28/21 2141 04/29/21 0128  TROPONINIHS 475* 478* 79* 100* 87*     Chemistry Recent Labs  Lab 04/25/21 0442 04/26/21 0338 04/28/21 1941  NA 139 138 136  K 4.2 4.3 4.3  CL 109 104 105  CO2 20* 25 22  GLUCOSE 129* 114* 118*  BUN 15 16 21   CREATININE 0.73 0.77 0.83  CALCIUM 8.8* 9.1 9.2  GFRNONAA >60 >60 >60  ANIONGAP 10 9 9     Lipids  Recent Labs  Lab 04/26/21 0338  CHOL 245*  TRIG 150*  HDL 50  LDLCALC 165*  CHOLHDL 4.9    Hematology Recent Labs  Lab 04/26/21 0338 04/27/21 0038 04/28/21 1941  WBC 8.9 8.5 8.6  RBC 4.80 4.14 4.65  HGB 16.0* 13.7 15.2*  HCT 47.1* 40.5 46.1*  MCV 98.1 97.8 99.1  MCH 33.3 33.1 32.7  MCHC 34.0 33.8 33.0  RDW 13.3 13.6 13.3  PLT 272 249 246   Thyroid  Recent Labs  Lab 04/23/21 1212  TSH 1.836    BNPNo results for input(s): BNP, PROBNP in the last 168  hours.  DDimer  Recent Labs  Lab 04/23/21 1303  DDIMER 1.34*     Radiology    No results found.  Cardiac Studies      Patient Profile     87 y.o. female with severel LV and 3 V CAD   Assessment & Plan     1.  Coronary artery disease: The patient has severe left main and three-vessel coronary artery disease.  Given her age and frail condition, medical therapy has been recommended.  She was started on isosorbide which is helped but she still having episodes of chest discomfort.  Her heart rate remains a bit fast.  We will increase the metoprolol to 50 mg twice a day. Will start Plavix today.  I will give her 300 mg load followed by 75 mg starting tomorrow.  We will stop the aspirin.  She is not a candidate for coronary artery bypass grafting.  We will watch her for several days and then discharge her when she is more stable.  2.  Hypothyroidism: Continue Synthroid.  3.  Hyperlipidemia: Continue rosuvastatin 20 mg a  day.   For questions or updates, please contact [place] Please consult [email or web address] for contact info under        Signed, Mertie Moores, MD  04/29/2021, 9:15 AM

## 2021-04-30 DIAGNOSIS — I2510 Atherosclerotic heart disease of native coronary artery without angina pectoris: Secondary | ICD-10-CM

## 2021-04-30 HISTORY — DX: Atherosclerotic heart disease of native coronary artery without angina pectoris: I25.10

## 2021-04-30 MED ORDER — ENOXAPARIN SODIUM 40 MG/0.4ML IJ SOSY: 40.0000 mg | PREFILLED_SYRINGE | INTRAMUSCULAR | Status: DC

## 2021-04-30 MED ORDER — METOPROLOL TARTRATE 50 MG PO TABS: 50.0000 mg | ORAL_TABLET | Freq: Two times a day (BID) | ORAL | 2 refills | Status: DC

## 2021-04-30 MED ORDER — ISOSORBIDE MONONITRATE ER 60 MG PO TB24: 60.0000 mg | ORAL_TABLET | Freq: Every day | ORAL | 2 refills | Status: DC

## 2021-04-30 MED ORDER — NITROGLYCERIN 0.4 MG SL SUBL: 0.4000 mg | SUBLINGUAL_TABLET | SUBLINGUAL | 2 refills | Status: DC | PRN

## 2021-04-30 MED ORDER — CLOPIDOGREL BISULFATE 75 MG PO TABS: 75.0000 mg | ORAL_TABLET | Freq: Every day | ORAL | 5 refills | Status: DC

## 2021-04-30 NOTE — Progress Notes (Signed)
Progress Note  Patient Name: Shannon Walker Date of Encounter: 04/30/2021  [facility] Cardiologist: Shirlee More, MD    Subjective   87 year old female with three-vessel coronary artery disease.  Given her comorbidities medical therapy has been recommended.  She was recently admitted to the hospital with a non-ST segment elevation myocardial infarction.  She was started on isosorbide.  She returned to the hospital several days later because of recurrent chest pain.  We changed her ASA to plavix yesterday  Will complete 48 hrs of Lovenox and then DC / change to DVT prophylaxis while she is here.  She has ambulated ,   has done well    Inpatient Medications    Scheduled Meds:  clopidogrel  75 mg Oral Daily   [START ON 05/01/2021] enoxaparin (LOVENOX) injection  40 mg Subcutaneous Q24H   enoxaparin (LOVENOX) injection  90 mg Subcutaneous Q12H   isosorbide mononitrate  60 mg Oral Daily   levothyroxine  50 mcg Oral QAC breakfast   metoprolol tartrate  50 mg Oral BID   rosuvastatin  20 mg Oral Daily   Continuous Infusions:  PRN Meds: acetaminophen, nitroGLYCERIN, ondansetron (ZOFRAN) IV   Vital Signs    Vitals:   04/29/21 2109 04/29/21 2300 04/30/21 0300 04/30/21 0746  BP:  113/68 96/65 105/68  Pulse: 90 (!) 58 67 73  Resp:  17 15 18   Temp:  97.6 F (36.4 C) 98 F (36.7 C) 97.8 F (36.6 C)  TempSrc:  Oral Oral Oral  SpO2:  92% 92% 94%  Weight:      Height:        Intake/Output Summary (Last 24 hours) at 04/30/2021 0945 Last data filed at 04/29/2021 1700 Gross per 24 hour  Intake 720 ml  Output --  Net 720 ml    Last 3 Weights 04/29/2021 04/28/2021 04/27/2021  Weight (lbs) 204 lb 9.4 oz 203 lb 1.6 oz 203 lb 1.6 oz  Weight (kg) 92.8 kg 92.126 kg 92.126 kg      Telemetry    Sinus  - Personally Reviewed  ECG     - Personally Reviewed  Physical Exam   Physical Exam: Blood pressure 105/68, pulse 73, temperature 97.8 F (36.6 C), temperature source  Oral, resp. rate 18, height 5\' 2"  (1.575 m), weight 92.8 kg, SpO2 94 %.  GEN:  elderly, moderately obese female , NAD  HEENT: Normal NECK: No JVD; No carotid bruits LYMPHATICS: No lymphadenopathy CARDIAC: RRR , no murmurs, rubs, gallops RESPIRATORY:  Clear to auscultation without rales, wheezing or rhonchi  ABDOMEN: Soft, non-tender, non-distended MUSCULOSKELETAL:  No edema; No deformity  SKIN: Warm and dry NEUROLOGIC:  Alert and oriented x 3    Labs    High Sensitivity Troponin:   Recent Labs  Lab 04/24/21 0859 04/24/21 0926 04/28/21 1941 04/28/21 2141 04/29/21 0128  TROPONINIHS 475* 478* 79* 100* 87*      Chemistry Recent Labs  Lab 04/25/21 0442 04/26/21 0338 04/28/21 1941  NA 139 138 136  K 4.2 4.3 4.3  CL 109 104 105  CO2 20* 25 22  GLUCOSE 129* 114* 118*  BUN 15 16 21   CREATININE 0.73 0.77 0.83  CALCIUM 8.8* 9.1 9.2  GFRNONAA >60 >60 >60  ANIONGAP 10 9 9      Lipids  Recent Labs  Lab 04/26/21 0338  CHOL 245*  TRIG 150*  HDL 50  LDLCALC 165*  CHOLHDL 4.9     Hematology Recent Labs  Lab 04/26/21 0338 04/27/21 0038  04/28/21 1941  WBC 8.9 8.5 8.6  RBC 4.80 4.14 4.65  HGB 16.0* 13.7 15.2*  HCT 47.1* 40.5 46.1*  MCV 98.1 97.8 99.1  MCH 33.3 33.1 32.7  MCHC 34.0 33.8 33.0  RDW 13.3 13.6 13.3  PLT 272 249 246    Thyroid  Recent Labs  Lab 04/23/21 1212  TSH 1.836     BNPNo results for input(s): BNP, PROBNP in the last 168 hours.  DDimer  Recent Labs  Lab 04/23/21 1303  DDIMER 1.34*      Radiology    No results found.  Cardiac Studies      Patient Profile     87 y.o. female with severel LV and 3 V CAD   Assessment & Plan     1.  Coronary artery disease: The patient has severe left main and three-vessel coronary artery disease.  Given her age and frail condition, medical therapy has been recommended.  She was started on isosorbide which is helped but she still having episodes of chest discomfort.  Imdur was increased  to 60 mg a day yesterday  Plavix was added,  ASA was stopped   We discussed the fact that she may need multiple NTG during the course of the day   2.  Hypothyroidism: Continue Synthroid.  3.  Hyperlipidemia:  continue rosuvastatin 20 mg a day   She will follow up with Dr. Irish Lack / APP in the next several weeks.    For questions or updates, please contact [place] Please consult [email or web address] for contact info under        Signed, Mertie Moores, MD  04/30/2021, 9:45 AM

## 2021-04-30 NOTE — Discharge Instructions (Signed)
Medication Changes: - STOP Aspirin and START Plavix 75mg  daily. - INCREASE Isosorbide mononitrate (Imdur) to 60mg  daily. - INCREASE Metoprolol tartrate (Lopressor) to 50mg  twice daily.

## 2021-04-30 NOTE — Plan of Care (Signed)

## 2021-04-30 NOTE — Discharge Summary (Addendum)
Discharge Summary    Patient ID: Shannon Walker MRN: [id number]; DOB: 30-Mar-1933  Admit date: 04/28/2021 Discharge date: 04/30/2021  PCP:  Shannon Camel, NP   [facility] Providers Cardiologist:  Shannon Grooms, MD   Discharge Diagnoses    Principal Problem:   Unstable angina [facility]) Active Problems:   HTN (hypertension)   Acquired hypothyroidism   Bilateral carotid artery stenosis   Mixed hyperlipidemia   CAD (coronary artery disease)    Diagnostic Studies/Procedures    None this admission.   History of Present Illness     Shannon Walker is a 87 y.o. female with a history of severe CAD including left main disease on recent cardiac on 04/24/2021 not felt to be a PCI or CABG candidate so treating medically, bilateral carotid stenosis, hypertension, hyperlipidemia, and acquired hypothyroidism who was admitted on 04/28/2021 with unstable angina.   Patient was recently admitted on 04/23/2021 to 04/27/2021 with NSTEMI. LHC at that time showed severe multivessel disease including moderate to severe distal left main disease. She was not felt to be good PCI or CABG candidate due to it being too high risk so she was treated medically and started on Imdur. Echo during this admission showed LVEF of 65-70% with normal wall motion and mild LVH. She was discharged on 04/27/2021 and then returned to 04/28/2021 with recurrent chest pain with 3 episodes of angina in the last 24 hours since discharge. EKG showed no new ischemic changes. High-sensitivity troponin was mildly elevated and flat 79 >> 100 >> 87 (down from 400s). She was admitted for further evaluation.  Hospital Course     Consultants: None  Unstable Angina CAD Patient was admitted with unstable angina after recent admission for NSTEMI. High-sensitivity mildly elevated and flat. Recent LHC showed severe multivessel CAD including moderate to severe distal left main disease. She is not a good PCI or CABG candidate  so is being treated medically. Aspirin was stopped and she was loaded with Plavix. Antianginal were adjusted and Imdur and Lopressor were increased. She was able to ambulate and did well with this. She will be discharged on Plavix 75mg  daily, Imdur 60mg  daily, Lopressor 50mg  twice daily, and Crestor 20mg  daily. We will refill her sublingual Nitroglycerin and patient was advised that she may need this multiple times during the course of the day given her severe disease. She already has follow-up with Dr. Irish Walker arranged.  Hypertension BP soft but stable. Continue Imdur and Lopressor as above.  Hyperlipidemia Lipid panel on 04/26/2021 during recent admission: Total Cholesterol 245, Triglycerides 150, HDL 50, LDL 165. LDL goal <70 given. She was started on Crestor 20mg  daily at that time. Will continue. Will need repeat lipid panel and LFTs in 6-8 weeks.  Acquired Hypothyroidism Continue home Synthroid.  Patient was seen and examined by Dr. Acie Walker today and determined to be stable for discharge. Outpatient follow-up arranged (she was previously followed by Dr. Bettina Walker but would like to see Dr. Irish Walker). Medications as below.  Did the patient have an acute coronary syndrome (MI, NSTEMI, STEMI, etc) this admission?:  No.   The elevated Troponin was due to the acute medical illness (demand ischemia).   _____________  Discharge Vitals Blood pressure 105/68, pulse 73, temperature 97.8 F (36.6 C), temperature source Oral, resp. rate 18, height 5\' 2"  (1.575 m), weight 92.8 kg, SpO2 94 %.  Filed Weights   04/28/21 1934 04/29/21 0207  Weight: 92.1 kg 92.8 kg    Labs & Radiologic Studies  CBC Recent Labs    04/28/21 1941  WBC 8.6  HGB 15.2*  HCT 46.1*  MCV 99.1  PLT 979   Basic Metabolic Panel Recent Labs    04/28/21 1941  NA 136  K 4.3  CL 105  CO2 22  GLUCOSE 118*  BUN 21  CREATININE 0.83  CALCIUM 9.2   Liver Function Tests No results for input(s): AST, ALT, ALKPHOS,  BILITOT, PROT, ALBUMIN in the last 72 hours. No results for input(s): LIPASE, AMYLASE in the last 72 hours. High Sensitivity Troponin:   Recent Labs  Lab 04/24/21 0859 04/24/21 0926 04/28/21 1941 04/28/21 2141 04/29/21 0128  TROPONINIHS 475* 478* 79* 100* 87*    BNP Invalid input(s): POCBNP D-Dimer No results for input(s): DDIMER in the last 72 hours. Hemoglobin A1C No results for input(s): HGBA1C in the last 72 hours. Fasting Lipid Panel No results for input(s): CHOL, HDL, LDLCALC, TRIG, CHOLHDL, LDLDIRECT in the last 72 hours. Thyroid Function Tests No results for input(s): TSH, T4TOTAL, T3FREE, THYROIDAB in the last 72 hours.  Invalid input(s): FREET3 _____________  CT Angio Chest PE W and/or Wo Contrast  Result Date: 04/23/2021 CLINICAL DATA:  Positive D-dimer, PE suspected, low/intermediate probability. EXAM: CT ANGIOGRAPHY CHEST WITH CONTRAST TECHNIQUE: Multidetector CT imaging of the chest was performed using the standard protocol during bolus administration of intravenous contrast. Multiplanar CT image reconstructions and MIPs were obtained to evaluate the vascular anatomy. CONTRAST:  49mL OMNIPAQUE IOHEXOL 350 MG/ML SOLN COMPARISON:  04/23/2021, 08/23/2014. FINDINGS: Cardiovascular: The heart is enlarged there is no pericardial effusion. Coronary artery calcifications are noted. There is calcification of the mitral valve. Atherosclerotic calcification of the aorta without evidence of aneurysm. The pulmonary trunk is distended which may be associated with underlying pulmonary artery hypertension. No pulmonary artery filling defect is identified. Evaluation of the pulmonary arteries at the right lung base is limited due to compressive atelectasis. Mediastinum/Nodes: No enlarged mediastinal, hilar, or axillary lymph nodes. Thyroid gland, trachea, and esophagus demonstrate no significant findings. Lungs/Pleura: There is elevation of the right diaphragm. Atelectasis is present at the  lung bases bilaterally. No effusion or pneumothorax. Upper Abdomen: Small hiatal hernia. The gallbladder is surgically absent. There is a calcification in the left kidney measuring 4 mm. A hyperdense lesion is seen along the posterior aspect of the left kidney measuring 8 mm, possible proteinaceous or hemorrhagic cyst. Scattered diverticula are noted along the colon. Musculoskeletal: There is compression deformity at T6 which is unchanged from 2016. Review of the MIP images confirms the above findings. IMPRESSION: 1. No evidence of pulmonary embolism or other acute process. 2. Elevation of the right diaphragm with atelectasis at the lung bases bilaterally. 3. Cardiomegaly with multi-vessel coronary artery calcifications. 4. Mildly distended pulmonary trunk which may be associated with underlying pulmonary artery hypertension. 5. Remaining findings as described above. Electronically Signed   By: Brett Fairy M.D.   On: 04/23/2021 23:39   CARDIAC CATHETERIZATION  Result Date: 04/24/2021   Mid RCA lesion is 99% stenosed.   Prox RCA lesion is 40% stenosed.   Ost LM to Dist LM lesion is 60% stenosed.   Dist LM to Prox LAD lesion is 60% stenosed.   Mid LAD lesion is 99% stenosed.   1st Diag lesion is 60% stenosed. Moderate to severe distal left main stenosis Moderate, heavily calcified ostial LAD stenosis. Severe heavily calcified mid LAD stenosis just past the takeoff of the diagonal branch. The diagonal branch has diffuse moderate stenosis. Mild non-obstructive disease  in the Circumflex. The RCA is a large dominant vessel with heavy calcification in the proximal and mid vessel. The mid RCA has a severe, heavily calcified stenosis that is a functional CTO. The distal RCA branches fill from antegrade flow and from left to right collaterals. LVEDP 7 mmHg Recommendations: She has severe distal left main disease and severe, calcific disease in the RCA and LAD. Her disease would be best treated with CABG as I am not  sure we would have a favorable result with PCI. She will be a high risk candidate for CABG given her age but overall she seems relatively functional. PCI will not be a good option for revascularization. Will ask CT surgery to see her for CABG. Resume IV heparin 8 hours post sheath pull. Continue ASA, statin and beta blocker.   DG Chest Port 1 View  Result Date: 04/23/2021 CLINICAL DATA:  Chest pain. EXAM: PORTABLE CHEST 1 VIEW COMPARISON:  03/13/2021 and older exams. FINDINGS: Cardiac silhouette is normal in size. No mediastinal or hilar masses. Lung volumes are low. There is bronchovascular crowding and linear lung base atelectasis. Lungs otherwise clear. No convincing pleural effusion and no pneumothorax. Skeletal structures are grossly intact. IMPRESSION: No acute cardiopulmonary disease. Electronically Signed   By: Lajean Manes M.D.   On: 04/23/2021 12:26   ECHOCARDIOGRAM COMPLETE  Result Date: 04/24/2021    ECHOCARDIOGRAM REPORT   Patient Name:   Shannon Walker Date of Exam: 04/24/2021 Medical Rec #:  [id number]        Height:       62.0 in Accession #:    [id number]       Weight:       207.2 lb Date of Birth:  1932/06/17        BSA:          1.941 m Patient Age:    42 years         BP:           131/60 mmHg Patient Gender: F                HR:           92 bpm. Exam Location:  Inpatient Procedure: 2D Echo, Cardiac Doppler, Color Doppler, Strain Analysis and 3D Echo Indications:    CAD Native Vessel I25.10  History:        Patient has prior history of Echocardiogram examinations, most                 recent 03/15/2021. Carotid Disease; Risk Factors:Hypertension                 and Dyslipidemia. Hypothyroidism.  Sonographer:    Darlina Sicilian RDCS Referring Phys: [place]  1. Global longitudinal strain is -15.2%. Left ventricular ejection fraction, by estimation, is 65 to 70%. The left ventricle has normal function. The left ventricle has no regional wall motion  abnormalities. There is mild left ventricular hypertrophy. Left ventricular diastolic parameters are indeterminate.  2. Right ventricular systolic function is normal. The right ventricular size is normal.  3. Trivial mitral valve regurgitation. Moderate mitral annular calcification.  4. Aortic valve regurgitation is not visualized. Aortic valve sclerosis is present, with no evidence of aortic valve stenosis.  5. The inferior vena cava is normal in size with greater than 50% respiratory variability, suggesting right atrial pressure of 3 mmHg. FINDINGS  Left Ventricle: Global longitudinal strain is -15.2%. Left ventricular ejection fraction, by estimation,  is 65 to 70%. The left ventricle has normal function. The left ventricle has no regional wall motion abnormalities. The left ventricular internal cavity size was normal in size. There is mild left ventricular hypertrophy. Left ventricular diastolic parameters are indeterminate. Right Ventricle: The right ventricular size is normal. Right vetricular wall thickness was not assessed. Right ventricular systolic function is normal. Left Atrium: Left atrial size was normal in size. Right Atrium: Right atrial size was normal in size. Pericardium: There is no evidence of pericardial effusion. Mitral Valve: There is mild thickening of the mitral valve leaflet(s). Moderate mitral annular calcification. Trivial mitral valve regurgitation. MV peak gradient, 11.1 mmHg. The mean mitral valve gradient is 4.0 mmHg. Tricuspid Valve: The tricuspid valve is normal in structure. Tricuspid valve regurgitation is trivial. Aortic Valve: Aortic valve regurgitation is not visualized. Aortic valve sclerosis is present, with no evidence of aortic valve stenosis. Pulmonic Valve: The pulmonic valve was normal in structure. Pulmonic valve regurgitation is not visualized. Aorta: The aortic root and ascending aorta are structurally normal, with no evidence of dilitation. Venous: The inferior vena  cava is normal in size with greater than 50% respiratory variability, suggesting right atrial pressure of 3 mmHg.  LEFT VENTRICLE PLAX 2D LVIDd:         4.30 cm   Diastology LVIDs:         2.77 cm   LV e' medial:    4.38 cm/s LV PW:         1.20 cm   LV E/e' medial:  16.6 LV IVS:        1.30 cm   LV e' lateral:   6.66 cm/s LVOT diam:     1.90 cm   LV E/e' lateral: 10.9 LV SV:         50 LV SV Index:   26 LVOT Area:     2.84 cm                           3D Volume EF:                          3D EF:        54 %                          LV EDV:       76 ml                          LV ESV:       35 ml                          LV SV:        41 ml RIGHT VENTRICLE RV S prime:     11.20 cm/s LEFT ATRIUM             Index LA diam:        3.50 cm 1.80 cm/m LA Vol (A2C):   21.9 ml 11.28 ml/m LA Vol (A4C):   25.0 ml 12.88 ml/m LA Biplane Vol: 23.7 ml 12.21 ml/m  AORTIC VALVE LVOT Vmax:   98.70 cm/s LVOT Vmean:  55.400 cm/s LVOT VTI:    0.176 m  AORTA Ao Root diam: 3.20 cm Ao Asc diam:  3.30 cm MITRAL VALVE MV Area (PHT): 4.29  cm     SHUNTS MV Area VTI:   2.02 cm     Systemic VTI:  0.18 m MV Peak grad:  11.1 mmHg    Systemic Diam: 1.90 cm MV Mean grad:  4.0 mmHg MV Vmax:       1.66 m/s MV Vmean:      90.4 cm/s MV Decel Time: 177 msec MV E velocity: 72.60 cm/s MV A velocity: 162.00 cm/s MV E/A ratio:  0.45 Dorris Carnes MD Electronically signed by Dorris Carnes MD Signature Date/Time: 04/24/2021/6:09:20 PM    Final    Disposition   Patient is being discharged home today in good condition.  Follow-up Plans & Appointments     Follow-up Information     Jettie Booze, MD Follow up.   Specialties: Cardiology, Radiology, Interventional Cardiology Why: Hospital follow-up with Cardiology is scheduled for 05/24/2021 at 9:00am. Please arrive 15 minutes early for check-in. Contact information: [address] [place] [address] [phone]                Discharge Instructions     Diet  - low sodium heart healthy   Complete by: As directed    Increase activity slowly   Complete by: As directed        Discharge Medications   Allergies as of 04/30/2021       Reactions   Bee Venom Anaphylaxis   Celecoxib Other (See Comments)   Abdominal pain   Iodine Hives   Sulfamethoxazole-trimethoprim Other (See Comments)   Restless legs, involuntary movements   Codeine Palpitations   Iodinated Diagnostic Agents Rash   Nabumetone Other (See Comments), Palpitations   Tachycardia        Medication List     STOP taking these medications    aspirin EC 81 MG tablet   predniSONE 50 MG tablet Commonly known as: DELTASONE       TAKE these medications    ALPRAZolam 0.5 MG tablet Commonly known as: XANAX Take 0.25 mg by mouth every 8 (eight) hours as needed for anxiety.   clopidogrel 75 MG tablet Commonly known as: PLAVIX Take 1 tablet (75 mg total) by mouth daily. Start taking on: May 01, 2021   cyanocobalamin 100 MCG tablet Take 100 mcg by mouth daily.   Garlic 916 MG Caps Take 500 mg by mouth daily.   isosorbide mononitrate 60 MG 24 hr tablet Commonly known as: IMDUR Take 1 tablet (60 mg total) by mouth daily. Start taking on: May 01, 2021 What changed:  medication strength how much to take   levothyroxine 50 MCG tablet Commonly known as: SYNTHROID Take 50 mcg by mouth daily before breakfast.   Magnesium Gluconate 550 MG Tabs Take 500 mg by mouth daily.   metoprolol tartrate 50 MG tablet Commonly known as: LOPRESSOR Take 1 tablet (50 mg total) by mouth 2 (two) times daily. What changed:  medication strength how much to take   nitroGLYCERIN 0.4 MG SL tablet Commonly known as: NITROSTAT Place 1 tablet (0.4 mg total) under the tongue every 5 (five) minutes x 3 doses as needed for chest pain. What changed:  medication strength how much to take when to take this   Omega-3 1000 MG Caps Take 1,000 mg by mouth daily.   rosuvastatin 20  MG tablet Commonly known as: CRESTOR Take 1 tablet (20 mg total) by mouth daily.   Vitamin D (Cholecalciferol) 25 MCG (1000 UT) Caps Take 2,000 Units by mouth daily.  Outstanding Labs/Studies   Repeat lipid panel and LFTs in 6-8 weeks.  Duration of Discharge Encounter   Greater than 30 minutes including physician time.  Signed, Darreld Mclean, PA-C 04/30/2021, 11:14 AM  Attending Note:   The patient was seen and examined.  Agree with assessment and plan as noted above.  Changes made to the above note as needed.  Patient seen and independently examined with Sande Rives, PA .   We discussed all aspects of the encounter. I agree with the assessment and plan as stated above.     1.  Coronary artery disease: The patient has severe left main and three-vessel coronary artery disease.  Given her age and frail condition, medical therapy has been recommended.  She was started on isosorbide which is helped but she still having episodes of chest discomfort.   Imdur was increased to 60 mg a day yesterday  Plavix was added,  ASA was stopped    We discussed the fact that she may need multiple NTG during the course of the day    2.  Hypothyroidism: Continue Synthroid.   3.  Hyperlipidemia:  continue rosuvastatin 20 mg a day    She will follow up with Dr. Irish Walker / APP in the next several weeks.    I have spent a total of 40 minutes with patient reviewing hospital  notes , telemetry, EKGs, labs and examining patient as well as establishing an assessment and plan that was discussed with the patient.  > 50% of time was spent in direct patient care.    [place] Headings, Brooke Bonito., MD, [facility] 05/03/2021, 6:26 PM [address],  [place] Pager [phone]

## 2021-05-04 ENCOUNTER — Ambulatory Visit (HOSPITAL_COMMUNITY): Payer: Medicare Other

## 2021-05-15 ENCOUNTER — Telehealth: Payer: Self-pay | Admitting: Interventional Cardiology

## 2021-05-15 ENCOUNTER — Encounter: Payer: Self-pay | Admitting: Cardiology

## 2021-05-15 DIAGNOSIS — E782 Mixed hyperlipidemia: Secondary | ICD-10-CM

## 2021-05-15 NOTE — Telephone Encounter (Signed)
Pt c/o medication issue:  1. Name of Medication:  rosuvastatin (CRESTOR) 20 MG tablet   2. How are you currently taking this medication (dosage and times per day)?  As prescribed, 1 tablet once daily by mouth  3. Are you having a reaction (difficulty breathing--STAT)?  See below  4. What is your medication issue?   Patient states this medication is causing sever muscle and joint pain. She states the past 3 nights she was unable to sleep due to the pain. She would like to know if she can be put on an alternative. Please advise.

## 2021-05-15 NOTE — Telephone Encounter (Signed)
I spoke with patient. She reports pain in her arms, shoulders and bones.  Some in her legs also.  She started Rosuvastatin 2 weeks ago.  Pain started after she began taking Rosuvastatin.  Different than chest pain she had prior to recent admission.  States she had the same problem with Lipitor in the past.  She is seeing Dr Irish Lack on 12/28.  I told patient to stop Rosuvastatin for now and discuss with Dr Irish Lack at upcoming office visit.

## 2021-05-15 NOTE — Telephone Encounter (Signed)
Error

## 2021-05-16 ENCOUNTER — Ambulatory Visit: Payer: Medicare Other | Admitting: Cardiology

## 2021-05-17 NOTE — Telephone Encounter (Signed)
I spoke with patient and she would like to proceed with lipid clinic referral.  Appointment made for June 08, 2021 at 9:30

## 2021-05-17 NOTE — Telephone Encounter (Signed)
Looks like we need a lipid clinic referral to discuss further options. She is older and may decline, but I think we should document that if she declines.

## 2021-05-20 NOTE — Progress Notes (Signed)
Cardiology Office Note   Date:  05/24/2021   ID:  Shannon, Walker 06-12-1932, MRN [id number]  PCP:  Shannon Camel, NP    No chief complaint on file.  CAD  Wt Readings from Last 3 Encounters:  05/24/21 205 lb 12.8 oz (93.4 kg)  04/29/21 204 lb 9.4 oz (92.8 kg)  04/27/21 203 lb 1.6 oz (92.1 kg)       History of Present Illness: Shannon Walker is a 87 y.o. female  with NSTEMI in 11/22, and cath showing 3 vessel disease.  Records show: "underwent LHC which showed severe triple-vessel disease-extensive discussion was held with patient by cardiology/cardiothoracic surgery-patient did not want to pursue high risk CABG-Dr. Marlowe Alt to the hospital again the day after discharge. Troponins were decreasing and she was discharge.  CP only when she rushes.  She did not tolerate the cholesterol med- rosuvastatin.  She wants to start a fish oil supplement.  She has an appointment with lipid clinic to discuss further options.  Denies :  Dizziness. Leg edema. Orthopnea. Palpitations. Paroxysmal nocturnal dyspnea. Syncope.   She is gone several weeks without using any nitroglycerin.  Past Medical History:  Diagnosis Date   Acquired hypothyroidism 09/16/2015   Last Assessment & Plan:  Relevant Hx: Course: Daily Update: Today's Plan:continue to follow her reviewed her last level for her  Electronically signed by: Shannon Camel, NP 11/16/15 1241   Allergic rhinitis due to pollen 11/02/2011   Aneurysm of abdominal aorta    Arthritis, midfoot 09/16/2015   Bilateral carotid artery stenosis 12/09/2012   Calculus of kidney    Chest pain 12/09/2012   Coronary artery disease    Cystocele, lateral    Cystocele, midline    Cystocele, unspecified (CODE) 09/16/2015   Disorder of bone and cartilage, unspecified    Dyspnea and respiratory abnormality 12/09/2012   Edema of left foot 06/12/2017   Elevated blood pressure reading 12/09/2012   Encounter  for long-term (current) use of aspirin 12/09/2012   Encounter for long-term (current) use of other medications 12/09/2012   Headache 12/09/2012   Hematuria, unspecified    HTN (hypertension) 10/05/2013   Hypertension    Left foot pain 06/12/2017   Localized osteoarthrosis not specified whether primary or secondary, unspecified site    Malaise and fatigue 12/09/2012   Last Assessment & Plan:  Relevant Hx: Course: Daily Update: Today's Plan:she is feeling her energy is returning for her of which she is really pleased with  Electronically signed by: Shannon Camel, NP 11/16/15 1247   Menopausal disorder 09/16/2015   Last Assessment & Plan:  Relevant Hx: Course: Daily Update: Today's Plan:this is stable for her and discussed UTI and relationship with lack of estrogen her UA here today was clear for her which she was pleased with  Electronically signed by: Shannon Camel, NP 11/16/15 1246   Mixed hyperlipidemia 09/16/2015   Last Assessment & Plan:  Relevant Hx: Course: Daily Update: Today's Plan:plan to see her lipids when she returns in September and update them then  Electronically signed by: Shannon Camel, NP 11/16/15 1246   Morbid obesity ([place])    Neoplasm of uncertain behavior of lip, oral cavity, 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   Neoplasm of unspecified nature of bone, soft tissue, and skin    Nonspecific abnormal cardiovascular system function study 12/09/2012   Obesity with body mass index of 30.0-39.9 09/16/2015   Last Assessment & Plan:  Relevant Hx: Course: Daily  Update: Today's Plan:she is doing the next 56 days and has lost 10 pounds of which she is very proud of as this is a lifestyle change for her and she is pleased with her efforts since she has never really been able to lose weight before and is hopeful she will be able to walk some with the weight loss  Electronically signed by: Ruthell Rummage Brow   Occlusion and stenosis of carotid artery 12/09/2012    Occlusion and stenosis of multiple and bilateral precerebral arteries 12/09/2012   Osteopenia 09/16/2015   Last Assessment & Plan:  Relevant Hx: Course: Daily Update: Today's Plan:reviewed with her the dexa scan last done advised her of need to take calcium and vitamin d  Electronically signed by: Shannon Camel, NP 09/19/15 2042   Other abnormal glucose    Other and unspecified hyperlipidemia    Other malaise and fatigue    Other specified pre-operative examination 12/09/2012   Palpitations 10/05/2013   Pre-syncope 10/05/2013   Prediabetes 09/16/2015   Last Assessment & Plan:  Relevant Hx: Course: Daily Update: Today's Plan:her last level was stable for her at 5.9 and will continue to follow and hopefully will be lower for her with her weight loss  Electronically signed by: Shannon Camel, NP 11/16/15 1242   Routine gynecological examination    Shortness of breath 12/09/2012   Subfibular impingement of left lower extremity 06/12/2017   SVT (supraventricular tachycardia) ([place]) 05/23/2017   Symptomatic PVCs 05/23/2017   Symptoms involving cardiovascular system 12/09/2012   Unspecified functional disorder of intestine    Unspecified hypothyroidism    Unspecified menopausal and postmenopausal disorder    Urge incontinence    Vaginal enterocele, congenital or acquired    Vitamin D deficiency     Past Surgical History:  Procedure Laterality Date   CYSTOSCOPY     LEFT HEART CATH AND CORONARY ANGIOGRAPHY N/A 04/24/2021   Procedure: LEFT HEART CATH AND CORONARY ANGIOGRAPHY;  Surgeon: Burnell Blanks, MD;  Location: [facility];  Service: Cardiovascular;  Laterality: N/A;   REPLACEMENT TOTAL KNEE       Current Outpatient Medications  Medication Sig Dispense Refill   ALPRAZolam (XANAX) 0.5 MG tablet Take 0.25 mg by mouth every 8 (eight) hours as needed for anxiety.     amLODipine (NORVASC) 5 MG tablet Take 5 mg by mouth 2 (two) times daily.      clopidogrel (PLAVIX) 75 MG tablet Take 1 tablet (75 mg total) by mouth daily. 30 tablet 5   cyanocobalamin 100 MCG tablet Take 100 mcg by mouth daily.     Garlic 063 MG CAPS Take 500 mg by mouth daily.     isosorbide mononitrate (IMDUR) 60 MG 24 hr tablet Take 1 tablet (60 mg total) by mouth daily. 30 tablet 2   levothyroxine (SYNTHROID) 50 MCG tablet Take 50 mcg by mouth daily before breakfast.     Magnesium Gluconate 550 MG TABS Take 500 mg by mouth daily.     metoprolol tartrate (LOPRESSOR) 50 MG tablet Take 1 tablet (50 mg total) by mouth 2 (two) times daily. 60 tablet 2   nitroGLYCERIN (NITROSTAT) 0.4 MG SL tablet Place 1 tablet (0.4 mg total) under the tongue every 5 (five) minutes x 3 doses as needed for chest pain. 100 tablet 2   Omega-3 1000 MG CAPS Take 1,000 mg by mouth daily.     Vitamin D, Cholecalciferol, 1000 units CAPS Take 2,000 Units by mouth daily.  rosuvastatin (CRESTOR) 20 MG tablet Take 1 tablet (20 mg total) by mouth daily. (Patient not taking: Reported on 05/24/2021) 30 tablet 3   No current facility-administered medications for this visit.    Allergies:   Bee venom, Celecoxib, Iodine, Sulfamethoxazole-trimethoprim, Codeine, Iodinated contrast media, and Nabumetone    Social History:  The patient  reports that she has never smoked. She has never used smokeless tobacco. She reports that she does not drink alcohol and does not use drugs.   Family History:  The patient's family history includes Clotting disorder in her father; Diabetes in her brother and sister; Heart Problems in her sister; Heart attack in her mother.    ROS:  Please see the history of present illness.   Otherwise, review of systems are positive for some anginal sx.   All other systems are reviewed and negative.    PHYSICAL EXAM: VS:  BP 128/84 (BP Location: Right Arm, Patient Position: Sitting, Cuff Size: Normal)    Pulse 66    Ht 5\' 2"  (1.575 m)    Wt 205 lb 12.8 oz (93.4 kg)    SpO2 98%    BMI  37.64 kg/m  , BMI Body mass index is 37.64 kg/m. GEN: Well nourished, well developed, in no acute distress HEENT: normal Neck: no JVD, carotid bruits, or masses Cardiac: RRR; no murmurs, rubs, or gallops,no edema  Respiratory:  clear to auscultation bilaterally, normal work of breathing GI: soft, nontender, nondistended, + BS, obese MS: no deformity or atrophy Skin: warm and dry, no rash Neuro:  Strength and sensation are intact Psych: euthymic mood, full affect    Recent Labs: 04/23/2021: TSH 1.836 04/28/2021: BUN 21; Creatinine, Ser 0.83; Hemoglobin 15.2; Platelets 246; Potassium 4.3; Sodium 136   Lipid Panel    Component Value Date/Time   CHOL 245 (H) 04/26/2021 0338   TRIG 150 (H) 04/26/2021 0338   HDL 50 04/26/2021 0338   CHOLHDL 4.9 04/26/2021 0338   VLDL 30 04/26/2021 0338   LDLCALC 165 (H) 04/26/2021 0338     Other studies Reviewed: Additional studies/ records that were reviewed today with results demonstrating: Hospital records reviewed, LDL 165 in November 2022.   ASSESSMENT AND PLAN:  CAD/Old MI: Sx well controlled. Continue current meds.  I explained to her that she will have to limit her activity somewhat to minimize symptoms.  She does not need to rush any activities.  People can wait for her.  We again went over the fact that revascularization would be high risk.  Part of her symptom management will be staying as active as she can with activities that do not cause chest discomfort.  If she begins to feel any symptoms, she needs to slow down. HTN: She has decreased metoprolol to 25 mg BID and occasionally takes the 50 mg dose. The current medical regimen is effective;  continue present plan and medications. Hyperlipidemia: She will try rosuvastatin 20 mg once a week.  Morbid obesity: Will be difficult to lose weight given limited activity and age.  Anxiety: uses Xanax.    Current medicines are reviewed at length with the patient today.  The patient concerns  regarding her medicines were addressed.  The following changes have been made:  No change  Labs/ tests ordered today include:  No orders of the defined types were placed in this encounter.   Recommend 150 minutes/week of aerobic exercise Low fat, low carb, high fiber diet recommended  Disposition:   FU in 6 months  Signed, Larae Grooms, MD  05/24/2021 9:20 AM    [facility] [place], [place], [place]  [address] Phone: [phone]; Fax: [phone]

## 2021-05-24 ENCOUNTER — Ambulatory Visit: Payer: Medicare Other | Admitting: Interventional Cardiology

## 2021-05-24 ENCOUNTER — Encounter: Payer: Self-pay | Admitting: Interventional Cardiology

## 2021-05-24 ENCOUNTER — Other Ambulatory Visit: Payer: Self-pay

## 2021-05-24 VITALS — BP 128/84 | HR 66 | Ht 62.0 in | Wt 205.8 lb

## 2021-05-24 DIAGNOSIS — E782 Mixed hyperlipidemia: Secondary | ICD-10-CM

## 2021-05-24 DIAGNOSIS — I10 Essential (primary) hypertension: Secondary | ICD-10-CM

## 2021-05-24 DIAGNOSIS — I25118 Atherosclerotic heart disease of native coronary artery with other forms of angina pectoris: Secondary | ICD-10-CM

## 2021-05-24 DIAGNOSIS — I252 Old myocardial infarction: Secondary | ICD-10-CM

## 2021-05-24 MED ORDER — METOPROLOL TARTRATE 25 MG PO TABS: 25.0000 mg | ORAL_TABLET | Freq: Two times a day (BID) | ORAL | 3 refills | Status: DC

## 2021-05-24 MED ORDER — ROSUVASTATIN CALCIUM 20 MG PO TABS: 20.0000 mg | ORAL_TABLET | ORAL | 3 refills | Status: DC

## 2021-05-24 NOTE — Patient Instructions (Signed)
Medication Instructions:  1) START Rosuvastatin 20mg  once weekly.  *If you need a refill on your cardiac medications before your next appointment, please call your pharmacy*   Lab Work: None If you have labs (blood work) drawn today and your tests are completely normal, you will receive your results only by: [place] (if you have MyChart) OR A paper copy in the mail If you have any lab test that is abnormal or we need to change your treatment, we will call you to review the results.   Testing/Procedures: None   Follow-Up: At [facility], you and your health needs are our priority.  As part of our continuing mission to provide you with exceptional heart care, we have created designated Provider Care Teams.  These Care Teams include your primary Cardiologist (physician) and Advanced Practice Providers (APPs -  Physician Assistants and Nurse Practitioners) who all work together to provide you with the care you need, when you need it.  We recommend signing up for the patient portal called "MyChart".  Sign up information is provided on this After Visit Summary.  MyChart is used to connect with patients for Virtual Visits (Telemedicine).  Patients are able to view lab/test results, encounter notes, upcoming appointments, etc.  Non-urgent messages can be sent to your provider as well.   To learn more about what you can do with MyChart, go to [email or web address].    Your next appointment:   6 month(s)  The format for your next appointment:   In Person  Provider:   Larae Grooms, MD     Other Instructions

## 2021-06-07 NOTE — Progress Notes (Signed)
Patient ID: Shannon Walker                 DOB: [birth year]                    MRN: [id number]     HPI: Shannon Walker is a 88 y.o. female patient referred to lipid clinic by Dr. Irish Lack. PMH is significant for mixed HLD, hx of NSTEMI, CAD (bilateral carotid artery stenosis in 2014), HTN, morbid obesity, SVT (in 2018).  On 04/23/21, pt was hospitalized with NSTEMI. CT revealed cardiomegaly with multi-vessel coronary artery calcifications. Echo showed normal LVEF function (65-70%). Pt underwent left heart cath which revealed severe triple-vessel disease (mid RCA 99% stenosis, ostial to distal left main 60% stenosis, mid LAD 99% stenosis, proximal LAD 60% stenosis in diagonal lesion). After an extensive discussion with the patient, she did not want to pursue high risk CABG at that time.  Pt returned to hospital one day post-discharge with recurrent angina sx over past-24 hours. Troponins mildly elevated (04/24/21 478 --> 04/28/21 100).   At f/u on 05/24/21 post-discharge, pt reported only having CP when she rushes. Dr. Irish Lack recommended that pt work to be as active as she can with activities that do not cause chest discomfort. She states that she did not tolerate rosuvastatin 20 mg daily. Recommended pt try rosuvastatin 20 mg weekly. She spoke about wanting to start a fish oil supplement and was referred to lipid clinic to discuss further options.  During a phone f/u from [facility] on 06/01/21, pt reported racing heart (pulse 105) waking her up at 0330. Recommended pt utilize xanax prn for anxiety. Pt reported not tolerating rosuvastatin once weekly.  Today, pt presents accompanied with daughter as she is hard of hearing. Pt has been taking Lovaza and garlic OTC. Pt reports stopping rosuvastatin 20 mg once weekly due to pain in her shoulders and chest. Shoulder/chest pain began after she took rosuvastatin for 2 weeks and occurred when she tried rosuvastatin 20 mg and 10 mg daily (1/2 tab) daily and 20 mg  once weekly. Similar symptoms occurred with taking atorvastatin 80 mg daily in past. Pain went away when she stopped taking a statin. Pt endorses eating a heart healthy diet, but is not able to exercise often due to heart symptoms.   Current Medications: OTC fish oil and garlic  Intolerances: rosuvastatin 10 mg and 20 mg daily, 20mg  once weekly (pain in her arms, shoulders and bones), atorvastatin 80 mg daily (pain in her arms, shoulders and bones)  Risk Factors: CAD, NSTEMI, HTN, age  LDL goal: <70 mg/dL  Diet: Pt's daughter cooks for pt and eats a Mediterranean Diet (grilled chicken and seafood)  Exercise: Pt has chest pain that inhibits her ability to exercise; occasionally walks out to mail box and pushes cart at grocery store  Family History: The patient's family history includes Clotting disorder in her father; Diabetes in her brother and sister; Heart Problems in her sister; Heart attack in her mother.   Social History: The patient  reports that she has never smoked. She has never used smokeless tobacco. She reports that she does not drink alcohol and does not use drugs.   Labs: 04/26/2021: TC  245, TGL 150, HDL 50, LDL 165 (no LLT) 03/02/2021: TC 212, TGL 205, HDL 45, LDL 143 (no LLT)  Past Medical History:  Diagnosis Date   Acquired hypothyroidism 09/16/2015   Last Assessment & Plan:  Relevant Hx: Course: Daily Update: Today's  Plan:continue to follow her reviewed her last level for her  Electronically signed by: Mayer Camel, NP 11/16/15 1241   Allergic rhinitis due to pollen 11/02/2011   Aneurysm of abdominal aorta    Arthritis, midfoot 09/16/2015   Bilateral carotid artery stenosis 12/09/2012   Calculus of kidney    Chest pain 12/09/2012   Coronary artery disease    Cystocele, lateral    Cystocele, midline    Cystocele, unspecified (CODE) 09/16/2015   Disorder of bone and cartilage, unspecified    Dyspnea and respiratory abnormality 12/09/2012   Edema of  left foot 06/12/2017   Elevated blood pressure reading 12/09/2012   Encounter for long-term (current) use of aspirin 12/09/2012   Encounter for long-term (current) use of other medications 12/09/2012   Headache 12/09/2012   Hematuria, unspecified    HTN (hypertension) 10/05/2013   Hypertension    Left foot pain 06/12/2017   Localized osteoarthrosis not specified whether primary or secondary, unspecified site    Malaise and fatigue 12/09/2012   Last Assessment & Plan:  Relevant Hx: Course: Daily Update: Today's Plan:she is feeling her energy is returning for her of which she is really pleased with  Electronically signed by: Mayer Camel, NP 11/16/15 1247   Menopausal disorder 09/16/2015   Last Assessment & Plan:  Relevant Hx: Course: Daily Update: Today's Plan:this is stable for her and discussed UTI and relationship with lack of estrogen her UA here today was clear for her which she was pleased with  Electronically signed by: Mayer Camel, NP 11/16/15 1246   Mixed hyperlipidemia 09/16/2015   Last Assessment & Plan:  Relevant Hx: Course: Daily Update: Today's Plan:plan to see her lipids when she returns in September and update them then  Electronically signed by: Mayer Camel, NP 11/16/15 1246   Morbid obesity ([place])    Neoplasm of uncertain behavior of lip, oral cavity, 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   Neoplasm of unspecified nature of bone, soft tissue, and skin    Nonspecific abnormal cardiovascular system function study 12/09/2012   Obesity with body mass index of 30.0-39.9 09/16/2015   Last Assessment & Plan:  Relevant Hx: Course: Daily Update: Today's Plan:she is doing the next 56 days and has lost 10 pounds of which she is very proud of as this is a lifestyle change for her and she is pleased with her efforts since she has never really been able to lose weight before and is hopeful she will be able to walk some with the weight loss  Electronically signed  by: Ruthell Rummage Brow   Occlusion and stenosis of carotid artery 12/09/2012   Occlusion and stenosis of multiple and bilateral precerebral arteries 12/09/2012   Osteopenia 09/16/2015   Last Assessment & Plan:  Relevant Hx: Course: Daily Update: Today's Plan:reviewed with her the dexa scan last done advised her of need to take calcium and vitamin d  Electronically signed by: Mayer Camel, NP 09/19/15 2042   Other abnormal glucose    Other and unspecified hyperlipidemia    Other malaise and fatigue    Other specified pre-operative examination 12/09/2012   Palpitations 10/05/2013   Pre-syncope 10/05/2013   Prediabetes 09/16/2015   Last Assessment & Plan:  Relevant Hx: Course: Daily Update: Today's Plan:her last level was stable for her at 5.9 and will continue to follow and hopefully will be lower for her with her weight loss  Electronically signed by: Mayer Camel, NP 11/16/15 1242   Routine gynecological examination  Shortness of breath 12/09/2012   Subfibular impingement of left lower extremity 06/12/2017   SVT (supraventricular tachycardia) ([place]) 05/23/2017   Symptomatic PVCs 05/23/2017   Symptoms involving cardiovascular system 12/09/2012   Unspecified functional disorder of intestine    Unspecified hypothyroidism    Unspecified menopausal and postmenopausal disorder    Urge incontinence    Vaginal enterocele, congenital or acquired    Vitamin D deficiency     Current Outpatient Medications on File Prior to Visit  Medication Sig Dispense Refill   ALPRAZolam (XANAX) 0.5 MG tablet Take 0.25 mg by mouth every 8 (eight) hours as needed for anxiety.     amLODipine (NORVASC) 5 MG tablet Take 5 mg by mouth 2 (two) times daily.     clopidogrel (PLAVIX) 75 MG tablet Take 1 tablet (75 mg total) by mouth daily. 30 tablet 5   cyanocobalamin 100 MCG tablet Take 100 mcg by mouth daily.     Garlic 902 MG CAPS Take 500 mg by mouth daily.     isosorbide mononitrate  (IMDUR) 60 MG 24 hr tablet Take 1 tablet (60 mg total) by mouth daily. 30 tablet 2   levothyroxine (SYNTHROID) 50 MCG tablet Take 50 mcg by mouth daily before breakfast.     Magnesium Gluconate 550 MG TABS Take 500 mg by mouth daily.     metoprolol tartrate (LOPRESSOR) 25 MG tablet Take 1 tablet (25 mg total) by mouth 2 (two) times daily. 180 tablet 3   nitroGLYCERIN (NITROSTAT) 0.4 MG SL tablet Place 1 tablet (0.4 mg total) under the tongue every 5 (five) minutes x 3 doses as needed for chest pain. 100 tablet 2   Omega-3 1000 MG CAPS Take 1,000 mg by mouth daily.     rosuvastatin (CRESTOR) 20 MG tablet Take 1 tablet (20 mg total) by mouth once a week. 15 tablet 3   Vitamin D, Cholecalciferol, 1000 units CAPS Take 2,000 Units by mouth daily.      No current facility-administered medications on file prior to visit.    Allergies  Allergen Reactions   Bee Venom Anaphylaxis   Celecoxib Other (See Comments)    Abdominal pain    Iodine Hives   Sulfamethoxazole-Trimethoprim Other (See Comments)    Restless legs, involuntary movements    Codeine Palpitations   Iodinated Contrast Media Rash   Nabumetone Other (See Comments) and Palpitations    Tachycardia     Assessment/Plan:  1. Hyperlipidemia - LDL elevated at 165, above goal <70 due to recent NSTEMI. Pt previously intolerant to rosuvastatin 10 mg daily, 20 mg daily, 20mg  once weekly, and atorvastatin 80 mg daily. Discussed statin rechallenge or initiation of PCSK9 inhibitor. Pt prefers to try PCSK9 inhibitor. Prior authorization for Repatha 140mg  q2weeks has been submitted and approved through 12/06/21. She is ok with anticipated ~$45/month copay. Recommended patient stop taking OTC fish oil and garlic supplements due to lack of CV or lipid benefit. F/u lipid panel in 8 weeks. Encouraged pt to engage in physical activity as able and to continue focusing on a heart healthy diet.   Jethro Poling, PharmD Student  Megan E. Supple, PharmD,  BCACP, [place] [address], [place], [place] [address] Phone: [phone]; Fax: [phone] 06/08/2021 10:11 AM

## 2021-06-08 ENCOUNTER — Other Ambulatory Visit: Payer: Self-pay

## 2021-06-08 ENCOUNTER — Ambulatory Visit: Payer: Medicare Other | Admitting: Pharmacist

## 2021-06-08 DIAGNOSIS — G720 Drug-induced myopathy: Secondary | ICD-10-CM

## 2021-06-08 DIAGNOSIS — E782 Mixed hyperlipidemia: Secondary | ICD-10-CM

## 2021-06-08 DIAGNOSIS — T466X5A Adverse effect of antihyperlipidemic and antiarteriosclerotic drugs, initial encounter: Secondary | ICD-10-CM

## 2021-06-08 HISTORY — DX: Drug-induced myopathy: G72.0

## 2021-06-08 MED ORDER — REPATHA SURECLICK 140 MG/ML ~~LOC~~ SOAJ: 1.0000 "pen " | SUBCUTANEOUS | 11 refills | Status: DC

## 2021-06-08 NOTE — Patient Instructions (Addendum)
It was nice to meet you today  Your LDL cholesterol is 165 and your goal is < 70   I will submit information to your insurance for [place] and let you know when I hear back.    Repatha is a subcutaneous injection given once every 2 weeks in the fatty tissue of your stomach or upper outer thigh. Store the medication in the fridge. You can let your dose warm up to room temperature for 30 minutes before injecting if you prefer. Repatha will lower your LDL cholesterol by 60% and helps to lower your chance of having a heart attack or stroke.  You can stop taking fish oil and garlic  Recheck fasting cholesterol on Monday, March 13th any time after 7:30am  Call Jinny Blossom, PharmD with any concerns [phone]

## 2021-07-05 ENCOUNTER — Telehealth: Payer: Self-pay | Admitting: Pharmacist

## 2021-07-05 NOTE — Telephone Encounter (Signed)
Pt called clinic reporting restless legs and being unable to sleep that she attributes to her Repatha. Advised her these are not side effects of Repatha and to follow up with her PCP. Then reports she had chest pain as well. Advised pt if she is experiencing chest pain she needs to call 911 in the future as this is not coming from her Repatha either. Her PCP did check updated labs on Repatha which are excellent with LDL of 56. She is agreeable to continuing on Repatha for now.

## 2021-08-01 ENCOUNTER — Ambulatory Visit (INDEPENDENT_AMBULATORY_CARE_PROVIDER_SITE_OTHER): Payer: Medicare Other | Admitting: Pulmonary Disease

## 2021-08-01 ENCOUNTER — Other Ambulatory Visit: Payer: Self-pay

## 2021-08-01 ENCOUNTER — Encounter: Payer: Self-pay | Admitting: Pulmonary Disease

## 2021-08-01 VITALS — BP 118/80 | HR 69 | Temp 97.4°F | Ht 63.0 in | Wt 201.0 lb

## 2021-08-01 DIAGNOSIS — R0683 Snoring: Secondary | ICD-10-CM

## 2021-08-01 NOTE — Progress Notes (Signed)
? ?[facility] ? ?Chief Complaint  ?Patient presents with  ? Consult  ?  Patient is here to talk about her sleep  ? ? ?Past Surgical History:  ?She  has a past surgical history that includes Replacement total knee; Cystoscopy; and LEFT HEART CATH AND CORONARY ANGIOGRAPHY (N/A, 04/24/2021). ? ?Past Medical History:  ?Hypothyroidism, Allergies, Aortic aneurysm, Nephrolithiasis, CAD, Cystocele, Hypertension, HLD, Osteopenia, Pre DM, SVT, Vit D deficiency ? ?Constitutional:  ?BP 118/80 (BP Location: Right Arm, Patient Position: Sitting, Cuff Size: Normal)   Pulse 69   Temp (!) 97.4 ?F (36.3 ?C) (Oral)   Ht '5\' 3"'$  (1.6 m)   Wt 201 lb (91.2 kg)   SpO2 97%   BMI 35.61 kg/m?  ? ?Brief Summary:  ?Shannon Walker is a 88 y.o. female with snoring. ?  ? ? ? ?Subjective:  ? ?She is here with her daughter. ? ?She has trouble falling asleep and staying asleep.  This has been going on for years.  She snores and will stop breathing at night.  She has trouble feeling sleepy during the day.  She wakes up feeling like her heart is racing and this makes her feel anxious. ? ?She goes to sleep at 1045 pm.  She falls asleep in an hour.  She wakes up 1 or 2 times to use the bathroom.  She gets out of bed at 7am.  She feels tired in the morning.  She denies morning headache.  She does not use anything to help her fall sleep or stay awake. ? ?She denies sleep walking, sleep talking, bruxism, or nightmares.  There is no history of restless legs.  She denies sleep hallucinations, sleep paralysis, or cataplexy. ? ?The Epworth score is 5 out of 24. ? ? ?Physical Exam:  ? ?Appearance - well kempt  ? ?ENMT - no sinus tenderness, no oral exudate, no LAN, Mallampati 3 airway, no stridor, wears dentures, poor hearing acuity ? ?Respiratory - equal breath sounds bilaterally, no wheezing or rales ? ?CV - s1s2 regular rate and rhythm, no murmurs ? ?Ext - no clubbing, no edema ? ?Skin - no rashes ? ?Psych -  normal mood and affect ?  ?Chest Imaging:  ?CT angio chest 04/23/21 >> atherosclerosis, elevated Rt diaphragm, ATX, small HH ? ?Sleep Tests:  ? ? ?Cardiac Tests:  ?Echo 04/24/21 >> EF 65 to 70%, mild LVH ? ?Social History:  ?She  reports that she has never smoked. She has never used smokeless tobacco. She reports that she does not drink alcohol and does not use drugs. ? ?Family History:  ?Her family history includes Clotting disorder in her father; Diabetes in her brother and sister; Heart Problems in her sister; Heart attack in her mother. ?  ? ?Discussion:  ?She has snoring, sleep disruption, apnea, and daytime sleepiness.  She has history of CAD, and hypertension.  I am concerned she could have obstructive sleep apnea. ? ?Assessment/Plan:  ? ?Snoring with excessive daytime sleepiness. ?- will need to arrange for a home sleep study ? ?Coronary artery disease. ?- she is followed by Dr. Casandra Walker with cardiology ? ?Obesity. ?- discussed how weight can impact sleep and risk for sleep disordered breathing ?- discussed options to assist with weight loss: combination of diet modification, cardiovascular and strength training exercises ? ?Cardiovascular risk. ?- had an extensive discussion regarding the adverse health consequences related to untreated sleep disordered breathing ?- specifically discussed the risks for hypertension, coronary artery  disease, cardiac dysrhythmias, cerebrovascular disease, and diabetes ?- lifestyle modification discussed ? ?Safe driving practices. ?- discussed how sleep disruption can increase risk of accidents, particularly when driving ?- safe driving practices were discussed ? ?Therapies for obstructive sleep apnea. ?- if the sleep study shows significant sleep apnea, then various therapies for treatment were reviewed: CPAP, oral appliance, and surgical interventions ? ? ?Time Spent Involved in Patient Care on Day of Examination:  ?37 minutes ? ?Follow up:  ? ?Patient Instructions   ?Will arrange for home sleep study ?Will call to arrange for follow up after sleep study reviewed ? ? ?Medication List:  ? ?Allergies as of 08/01/2021   ? ?   Reactions  ? Bee Venom Anaphylaxis  ? Celecoxib Other (See Comments)  ? Abdominal pain  ? Iodine Hives  ? Sulfamethoxazole-trimethoprim Other (See Comments)  ? Restless legs, involuntary movements  ? Codeine Palpitations  ? Iodinated Contrast Media Rash  ? Nabumetone Other (See Comments), Palpitations  ? Tachycardia  ? ?  ? ?  ?Medication List  ?  ? ?  ? Accurate as of August 01, 2021 12:07 PM. If you have any questions, ask your nurse or doctor.  ?  ?  ? ?  ? ?ALPRAZolam 0.5 MG tablet ?Commonly known as: Duanne Walker ?Take 0.25 mg by mouth every 8 (eight) hours as needed for anxiety. ?  ?amLODipine 5 MG tablet ?Commonly known as: NORVASC ?Take 5 mg by mouth 2 (two) times daily. ?  ?clopidogrel 75 MG tablet ?Commonly known as: PLAVIX ?Take 1 tablet (75 mg total) by mouth daily. ?  ?cyanocobalamin 100 MCG tablet ?Take 100 mcg by mouth daily. ?  ?isosorbide mononitrate 60 MG 24 hr tablet ?Commonly known as: IMDUR ?Take 1 tablet (60 mg total) by mouth daily. ?  ?levothyroxine 50 MCG tablet ?Commonly known as: SYNTHROID ?Take 50 mcg by mouth daily before breakfast. ?  ?Magnesium Gluconate 550 MG Tabs ?Take 500 mg by mouth daily. ?  ?metoprolol tartrate 25 MG tablet ?Commonly known as: LOPRESSOR ?Take 1 tablet (25 mg total) by mouth 2 (two) times daily. ?  ?nitroGLYCERIN 0.4 MG SL tablet ?Commonly known as: NITROSTAT ?Place 1 tablet (0.4 mg total) under the tongue every 5 (five) minutes x 3 doses as needed for chest pain. ?  ?Repatha SureClick 269 MG/ML Soaj ?Generic drug: Evolocumab ?Inject 1 pen into the skin every 14 (fourteen) days. ?  ?Vitamin D (Cholecalciferol) 25 MCG (1000 UT) Caps ?Take 2,000 Units by mouth daily. ?  ? ?  ? ? ?Signature:  ?Shannon Mires, MD ?Shannon Walker ?Pager - [phone] ?08/01/2021, 12:07 PM ?  ? ? ? ? ? ? ? ? ?

## 2021-08-01 NOTE — Patient Instructions (Signed)
Will arrange for home sleep study Will call to arrange for follow up after sleep study reviewed  

## 2021-08-07 ENCOUNTER — Other Ambulatory Visit: Payer: Medicare Other

## 2021-08-07 ENCOUNTER — Other Ambulatory Visit: Payer: Self-pay

## 2021-08-07 DIAGNOSIS — E782 Mixed hyperlipidemia: Secondary | ICD-10-CM

## 2021-08-07 LAB — LIPID PANEL
Chol/HDL Ratio: 3.1 ratio (ref 0.0–4.4)
Cholesterol, Total: 125 mg/dL (ref 100–199)
HDL: 40 mg/dL (ref >39)
LDL Chol Calc (NIH): 52 mg/dL (ref 0–99)
Triglycerides: 201 mg/dL — ABNORMAL HIGH (ref 0–149)
VLDL Cholesterol Cal: 33 mg/dL (ref 5–40)

## 2021-08-07 LAB — HEPATIC FUNCTION PANEL
ALT: 12 IU/L (ref 0–32)
AST: 15 IU/L (ref 0–40)
Albumin: 4 g/dL (ref 3.6–4.6)
Alkaline Phosphatase: 74 IU/L (ref 44–121)
Bilirubin Total: 0.4 mg/dL (ref 0.0–1.2)
Bilirubin, Direct: 0.11 mg/dL (ref 0.00–0.40)
Total Protein: 6.4 g/dL (ref 6.0–8.5)

## 2021-08-14 ENCOUNTER — Telehealth: Payer: Self-pay | Admitting: Interventional Cardiology

## 2021-08-14 NOTE — Telephone Encounter (Signed)
Repatha has been known to cause muscle pain in a small number of patients however it is unlikely to cause arthritis ?

## 2021-08-14 NOTE — Telephone Encounter (Signed)
I spoke with patient's daughter who reports patient thinks her arthritis has gotten worse since she started Repatha.  Daughter reports patient feels she can tolerated arthritis pain if Repatha is needed.  I advised daughter to have patient continue Repatha and told her we would call back if pharmacist felt Repatha may be worsening her arthritis pain ?

## 2021-08-14 NOTE — Telephone Encounter (Signed)
I spoke with patient's daughter and gave her information from pharmacist ?

## 2021-08-14 NOTE — Telephone Encounter (Signed)
Pt c/o medication issue: ? ?1. Name of Medication: Repatha ? ?2. How are you currently taking this medication (dosage and times per day)? 1 once every two weeks ? ?3. Are you having a reaction (difficulty breathing--STAT)?  ? ?4. What is your medication issue? Patient's daughter states if flares her mother arthritis. Pharm D told her to speak to Dr. Irish Lack to see what he wants to do.   ?

## 2021-09-05 ENCOUNTER — Telehealth: Payer: Self-pay | Admitting: Interventional Cardiology

## 2021-09-05 MED ORDER — PRALUENT 75 MG/ML ~~LOC~~ SOAJ: 1.0000 "pen " | SUBCUTANEOUS | 11 refills | Status: DC

## 2021-09-05 NOTE — Telephone Encounter (Signed)
Praluent PA approved through 03/07/22. Rx sent to pharmacy. Pt aware to stop Repatha and stay off PCSK9i for a few weeks to ensure her muscle aches resolve, then start on lower dose of Praluent every 2 weeks. Advised her to let us know how she tolerates Praluent. ?

## 2021-09-05 NOTE — Telephone Encounter (Signed)
Pt started on Repatha in January. Previously intolerant to rosuvastatin '10mg'$  and '20mg'$  daily and '20mg'$  once weekly, and atorvastatin '80mg'$  daily. ? ?LDL improved from 165 to 52 after starting Repatha and is now at goal < 70. Previously reported restless legs and insomnia that she attributed to her Repatha but advised her these side effects are not seen with Repatha. Now reporting muscle aches in her arms and legs. Spoke with pt, will try changing to Praluent '75mg'$  Q2W. Prior auth submitted, will f/u with pt once approved. ?

## 2021-09-05 NOTE — Telephone Encounter (Signed)
Pt c/o medication issue: ? ?1. Name of Medication: repatha  ? ?2. How are you currently taking this medication (dosage and times per day)? Once every 2 weeks  ? ?3. Are you having a reaction (difficulty breathing--STAT)? Na  ? ?4. What is your medication issue? Pt is really acky on this med and her body is really sore.  She would like to change this med  ? ?Best number [phone]  ?

## 2021-09-18 ENCOUNTER — Encounter: Payer: Self-pay | Admitting: Pulmonary Disease

## 2021-09-18 DIAGNOSIS — I493 Ventricular premature depolarization: Secondary | ICD-10-CM | POA: Diagnosis not present

## 2021-09-18 DIAGNOSIS — E785 Hyperlipidemia, unspecified: Secondary | ICD-10-CM | POA: Diagnosis not present

## 2021-09-18 DIAGNOSIS — R079 Chest pain, unspecified: Secondary | ICD-10-CM | POA: Diagnosis not present

## 2021-09-18 DIAGNOSIS — I10 Essential (primary) hypertension: Secondary | ICD-10-CM | POA: Diagnosis not present

## 2021-09-25 ENCOUNTER — Ambulatory Visit: Payer: Medicare Other

## 2021-09-25 DIAGNOSIS — G4733 Obstructive sleep apnea (adult) (pediatric): Secondary | ICD-10-CM

## 2021-09-25 DIAGNOSIS — R0683 Snoring: Secondary | ICD-10-CM

## 2021-09-26 ENCOUNTER — Telehealth: Payer: Self-pay | Admitting: Pulmonary Disease

## 2021-09-26 DIAGNOSIS — G4733 Obstructive sleep apnea (adult) (pediatric): Secondary | ICD-10-CM | POA: Diagnosis not present

## 2021-09-26 NOTE — Telephone Encounter (Signed)
HST 09/25/21>> AHI 22, SpO2 low 75% ? ?Please inform her that her sleep study shows moderate obstructive sleep apnea.  Please arrange for ROV with me or NP to discuss treatment options. ? ? ? ?

## 2021-09-26 NOTE — Telephone Encounter (Signed)
Called and went over results with patient. She voiced understanding. Nothing further needed. Appt scheduled to see NP In [place] office  ?

## 2021-10-06 ENCOUNTER — Ambulatory Visit: Payer: Medicare Other | Admitting: Nurse Practitioner

## 2021-10-06 ENCOUNTER — Encounter: Payer: Self-pay | Admitting: Nurse Practitioner

## 2021-10-06 VITALS — BP 118/66 | HR 66 | Temp 98.2°F | Ht 63.0 in | Wt 185.4 lb

## 2021-10-06 DIAGNOSIS — R0683 Snoring: Secondary | ICD-10-CM

## 2021-10-06 DIAGNOSIS — G4733 Obstructive sleep apnea (adult) (pediatric): Secondary | ICD-10-CM

## 2021-10-06 DIAGNOSIS — E669 Obesity, unspecified: Secondary | ICD-10-CM

## 2021-10-06 HISTORY — DX: Obstructive sleep apnea (adult) (pediatric): G47.33

## 2021-10-06 NOTE — Progress Notes (Signed)
Reviewed and agree with assessment/plan. ? ? ?Chesley Mires, MD ?[place] ?10/06/2021, 12:08 PM ?Pager:  [phone] ? ?

## 2021-10-06 NOTE — Progress Notes (Signed)
? ?'@Patient'$  ID: Shannon Walker, female    DOB: Dec 04, 1932, 88 y.o.   MRN: [id number] ? ?Chief Complaint  ?Patient presents with  ? Follow-up  ?  Follow up to go over sleep apnea treatment options from sleep study.   ? ? ?Referring provider: ?Brown-Patram, Melissa J* ? ?HPI: ?88 year old female, never smoker followed for moderate obstructive sleep apnea.  She is a patient of Dr. Juanetta Gosling and last seen in office on 08/01/2021 for sleep consult.  Past medical history significant for CAD, hypertension, bilateral carotid artery stenosis, AAA, NSTEMI, allergic rhinitis, GERD, hypothyroid, OA, HLD, prediabetes, vitamin D deficiency, obesity. ? ?TEST/EVENTS:  ?09/25/2021 HST: AHI 22, SPO2 low 75%.  Moderate obstructive sleep apnea ? ?08/01/2021: OV with Dr. Halford Chessman for sleep consult.  Reported that she had trouble falling asleep and staying asleep for many years.  Also reported snoring and witnessed apneas.  Excessive daytime fatigue symptoms.HST ordered for further eval. ? ?10/06/2021: Today - follow up ?Patient presents today with daughter for follow after completing home sleep study which showed moderate obstructive sleep apnea. She reports that she sleeps around 6-8 hours a night, does wake up some throughout the night and wakes feeling like she didn't sleep well. She has also been told in the past that she has accelerations in her heart rate at night but never underwent formal sleep study. She occasionally has morning headaches. She denies drowsy driving, narcolepsy, or cataplexy. She does not take anything to help her fall asleep and doesn't use anything to keep herself awake during the day.  ? ?Epworth 5 ? ?Allergies  ?Allergen Reactions  ? Bee Venom Anaphylaxis  ? Atorvastatin   ?  Myalgias on atorvastatin '80mg'$  daily  ? Celecoxib Other (See Comments)  ?  Abdominal pain ?  ? Iodine Hives  ? Repatha [Evolocumab]   ?  Muscle aches  ? Rosuvastatin   ?  Myalgias on rosuvastatin '10mg'$  and '20mg'$  daily and '20mg'$  once weekly  ?  Sulfamethoxazole-Trimethoprim Other (See Comments)  ?  Restless legs, involuntary movements ?  ? Codeine Palpitations  ? Iodinated Contrast Media Rash  ? Nabumetone Other (See Comments) and Palpitations  ?  Tachycardia ?  ? ? ?Immunization History  ?Administered Date(s) Administered  ? Influenza Split 02/25/2018  ? Pneumococcal Conjugate-13 04/08/2013  ? ? ?Past Medical History:  ?Diagnosis Date  ? Acquired hypothyroidism 09/16/2015  ? Allergic rhinitis due to pollen 11/02/2011  ? Aneurysm of abdominal aorta (HCC)   ? Arthritis, midfoot 09/16/2015  ? Bilateral carotid artery stenosis 12/09/2012  ? Calculus of kidney   ? Coronary artery disease   ? Cystocele, lateral   ? Cystocele, midline   ? Disorder of bone and cartilage, unspecified   ? Headache 12/09/2012  ? Hematuria, unspecified   ? Hypertension   ? Left foot pain 06/12/2017  ? Mixed hyperlipidemia 09/16/2015  ? 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 Occlusion and stenosis of carotid artery 12/09/2012  ? Osteopenia 09/16/2015  ? Palpitations 10/05/2013  ? Pre-syncope 10/05/2013  ? Prediabetes 09/16/2015  ? Subfibular impingement of left lower extremity 06/12/2017  ? SVT (supraventricular tachycardia) ([place]) 05/23/2017  ? Symptomatic PVCs 05/23/2017  ? Urge incontinence   ? Vaginal enterocele, congenital or acquired   ? Vitamin D deficiency   ? ? ?Tobacco History: ?Social History  ? ?Tobacco Use  ?Smoking Status Never  ?Smokeless Tobacco Never  ? ?Counseling given: Not Answered ? ? ?Outpatient Medications Prior to  Visit  ?Medication Sig Dispense Refill  ? ALPRAZolam (XANAX) 0.5 MG tablet Take 0.25 mg by mouth every 8 (eight) hours as needed for anxiety.    ? clopidogrel (PLAVIX) 75 MG tablet Take 1 tablet (75 mg total) by mouth daily. 30 tablet 5  ? cyanocobalamin 100 MCG tablet Take 100 mcg by mouth daily.    ? isosorbide mononitrate (IMDUR) 60 MG 24 hr tablet Take 1 tablet (60 mg total) by mouth daily. 30 tablet 2  ?  levothyroxine (SYNTHROID) 50 MCG tablet Take 50 mcg by mouth daily before breakfast.    ? Magnesium Gluconate 550 MG TABS Take 500 mg by mouth daily.    ? metoprolol tartrate (LOPRESSOR) 25 MG tablet Take 1 tablet (25 mg total) by mouth 2 (two) times daily. 180 tablet 3  ? nitroGLYCERIN (NITROSTAT) 0.4 MG SL tablet Place 1 tablet (0.4 mg total) under the tongue every 5 (five) minutes x 3 doses as needed for chest pain. 100 tablet 2  ? Vitamin D, Cholecalciferol, 1000 units CAPS Take 2,000 Units by mouth daily.     ? amLODipine (NORVASC) 5 MG tablet Take 5 mg by mouth 2 (two) times daily.    ? Alirocumab (PRALUENT) 75 MG/ML SOAJ Inject 1 pen. into the skin every 14 (fourteen) days. (Patient not taking: Reported on 10/06/2021) 2 mL 11  ? ?No facility-administered medications prior to visit.  ? ? ? ?Review of Systems:  ? ?Constitutional: No weight loss or gain, night sweats, fevers, chills. +excessive daytime fatigue ?HEENT: No headaches, difficulty swallowing, tooth/dental problems, or sore throat. No sneezing, itching, ear ache, nasal congestion, or post nasal drip. +morning headaches, snoring ?CV:  No chest pain, orthopnea, PND, swelling in lower extremities, anasarca, dizziness, palpitations, syncope ?Resp: +witnessed nocturnal apneas. No shortness of breath with exertion or at rest. No excess mucus or change in color of mucus. No productive or non-productive. No hemoptysis. No wheezing.  No chest wall deformity ?Skin: No rash, lesions, ulcerations ?MSK:  No joint pain or swelling.  No decreased range of motion.  No back pain. ?Neuro: No dizziness or lightheadedness.  ?Psych: No depression or anxiety. Mood stable.  ? ? ? ?Physical Exam: ? ?BP 118/66 (BP Location: Right Arm, Patient Position: Sitting, Cuff Size: Normal)   Pulse 66   Temp 98.2 ?F (36.8 ?C) (Oral)   Ht '5\' 3"'$  (1.6 m)   Wt 185 lb 6.4 oz (84.1 kg)   SpO2 99%   BMI 32.84 kg/m?  ? ?GEN: Pleasant, interactive, well-appearing; obese; in no acute  distress. ?HEENT:  Normocephalic and atraumatic. PERRLA. Sclera white. Nasal turbinates pink, moist and patent bilaterally. No rhinorrhea present. Oropharynx pink and moist, without exudate or edema. Edentulous - wears dentures. No lesions, ulcerations, or postnasal drip.  ?NECK:  Supple w/ fair ROM. No JVD present. Normal carotid impulses w/o bruits. Thyroid symmetrical with no goiter or nodules palpated. No lymphadenopathy.   ?CV: RRR, no m/r/g, no peripheral edema. Pulses intact, +2 bilaterally. No cyanosis, pallor or clubbing. ?PULMONARY:  Unlabored, regular breathing. Clear bilaterally A&P w/o wheezes/rales/rhonchi. No accessory muscle use. No dullness to percussion. ?GI: BS present and normoactive. Soft, non-tender to palpation. No organomegaly or masses detected. No CVA tenderness. ?MSK: No erythema, warmth or tenderness. Cap refil <2 sec all extrem. No deformities or joint swelling noted.  ?Neuro: A/Ox3. No focal deficits noted.   ?Skin: Warm, no lesions or rashe ?Psych: Normal affect and behavior. Judgement and thought content appropriate.  ? ? ? ?  Lab Results: ? ?CBC ?   ?Component Value Date/Time  ? WBC 8.6 04/28/2021 1941  ? RBC 4.65 04/28/2021 1941  ? HGB 15.2 (H) 04/28/2021 1941  ? HCT 46.1 (H) 04/28/2021 1941  ? PLT 246 04/28/2021 1941  ? MCV 99.1 04/28/2021 1941  ? MCH 32.7 04/28/2021 1941  ? MCHC 33.0 04/28/2021 1941  ? RDW 13.3 04/28/2021 1941  ? LYMPHSABS 2.7 06/30/2014 0755  ? MONOABS 0.6 06/30/2014 0755  ? EOSABS 0.3 06/30/2014 0755  ? BASOSABS 0.0 06/30/2014 0755  ? ? ?BMET ?   ?Component Value Date/Time  ? NA 136 04/28/2021 1941  ? K 4.3 04/28/2021 1941  ? CL 105 04/28/2021 1941  ? CO2 22 04/28/2021 1941  ? GLUCOSE 118 (H) 04/28/2021 1941  ? BUN 21 04/28/2021 1941  ? CREATININE 0.83 04/28/2021 1941  ? CALCIUM 9.2 04/28/2021 1941  ? GFRNONAA >60 04/28/2021 1941  ? GFRAA >90 06/30/2014 0755  ? ? ?BNP ?   ?Component Value Date/Time  ? BNP 16.9 06/30/2014 0755  ? ? ? ?Imaging: ? ?No results  found. ? ? ? ?   ? View : No data to display.  ?  ?  ?  ? ? ?No results found for: NITRICOXIDE ? ? ? ? ? ?Assessment & Plan:  ? ?Moderate obstructive sleep apnea ?HST with AHI 22 consistent with moderate obstructive sleep apnea. She i

## 2021-10-06 NOTE — Patient Instructions (Addendum)
Start CPAP auto 5-15 cmH2O every night, minimum of 4-6 hours a night.  ?Change equipment every 30 days or as directed by DME. Wash your tubing with warm soap and water daily, hang to dry. Wash humidifier portion weekly.  ?Be aware of reduced alertness and do not drive or operate heavy machinery if experiencing this or drowsiness.  ?Healthy weight management discussed.  ?Avoid or decrease alcohol consumption and medications that make you more sleepy, if possible. ?Notify if persistent daytime sleepiness occurs even with consistent use of CPAP. ? ?We discussed how untreated sleep apnea puts an individual at risk for cardiac arrhthymias, pulm HTN, DM, stroke and increases their risk for daytime accidents. We also briefly reviewed treatment options including weight loss, side sleeping position, oral appliance, CPAP therapy or referral to ENT for possible surgical options ? ?Follow up within 31-90 days after starting on CPAP therapy with Dr. Halford Chessman or Katie Lenton Gendreau,NP. Please call to schedule your appointment the day you get your CPAP. If symptoms do not improve or worsen, please contact office for sooner follow up or seek emergency care. ?

## 2021-10-06 NOTE — Assessment & Plan Note (Signed)
HST with AHI 22 consistent with moderate obstructive sleep apnea. She is agreeable to CPAP therapy. We will send orders for new start auto 5-15 cmH2O with mask of choice and heated humidification.  ? ?Patient Instructions  ?Start CPAP auto 5-15 cmH2O every night, minimum of 4-6 hours a night.  ?Change equipment every 30 days or as directed by DME. Wash your tubing with warm soap and water daily, hang to dry. Wash humidifier portion weekly.  ?Be aware of reduced alertness and do not drive or operate heavy machinery if experiencing this or drowsiness.  ?Healthy weight management discussed.  ?Avoid or decrease alcohol consumption and medications that make you more sleepy, if possible. ?Notify if persistent daytime sleepiness occurs even with consistent use of CPAP. ? ?We discussed how untreated sleep apnea puts an individual at risk for cardiac arrhthymias, pulm HTN, DM, stroke and increases their risk for daytime accidents. We also briefly reviewed treatment options including weight loss, side sleeping position, oral appliance, CPAP therapy or referral to ENT for possible surgical options ? ?Follow up within 31-90 days after starting on CPAP therapy with Dr. Halford Chessman or Katie Deion Forgue,NP. Please call to schedule your appointment the day you get your CPAP. If symptoms do not improve or worsen, please contact office for sooner follow up or seek emergency care. ? ? ?

## 2021-10-06 NOTE — Assessment & Plan Note (Signed)
Healthy weight management discussed. Exercise and weight loss encouraged.  ?

## 2021-11-06 ENCOUNTER — Other Ambulatory Visit: Payer: Self-pay | Admitting: Student

## 2021-12-07 ENCOUNTER — Ambulatory Visit: Payer: Medicare Other | Admitting: Nurse Practitioner

## 2021-12-07 ENCOUNTER — Encounter: Payer: Self-pay | Admitting: Nurse Practitioner

## 2021-12-07 VITALS — BP 110/82 | HR 67 | Temp 97.7°F | Ht 63.0 in | Wt 177.8 lb

## 2021-12-07 DIAGNOSIS — G4733 Obstructive sleep apnea (adult) (pediatric): Secondary | ICD-10-CM | POA: Diagnosis not present

## 2021-12-07 NOTE — Assessment & Plan Note (Signed)
Good compliance; however, she is having significant leaks and breakthrough events with full face mask. Advised she go for mask fitting to determine best fit mask for her. We are also going to adjust her pressures down to 5-12 cmH2O given her current requirements and leaks. If she is still having breakthrough, despite mask change and pressure adjustments, we will need to consider CPAP titration. Cautioned against drowsy driving in interim. She also is only getting around 5 hours of sleep a night - advised on increasing to 7-8 and better sleep hygiene.   Patient Instructions  Continue to use CPAP every night, minimum of 4-6 hours a night.  Change equipment every 30 days or as directed by DME. Wash your tubing with warm soap and water daily, hang to dry. Wash humidifier portion weekly.  Be aware of reduced alertness and do not drive or operate heavy machinery if experiencing this or drowsiness.  Exercise encouraged, as tolerated. Avoid or decrease alcohol consumption and medications that make you more sleepy, if possible. Notify if persistent daytime sleepiness occurs even with consistent use of CPAP.  Mask fitting ordered today - someone will call you to get this scheduled   Follow up in 6 weeks with Dr. Halford Chessman or Alanson Aly. If symptoms do not improve or worsen, please contact office for sooner follow up or seek emergency care.

## 2021-12-07 NOTE — Progress Notes (Signed)
Reviewed and agree with assessment/plan.   Chesley Mires, MD [facility] 12/07/2021, 11:05 AM Pager:  [phone]

## 2021-12-07 NOTE — Patient Instructions (Addendum)
Continue to use CPAP every night, minimum of 4-6 hours a night.  Change equipment every 30 days or as directed by DME. Wash your tubing with warm soap and water daily, hang to dry. Wash humidifier portion weekly.  Be aware of reduced alertness and do not drive or operate heavy machinery if experiencing this or drowsiness.  Exercise encouraged, as tolerated. Avoid or decrease alcohol consumption and medications that make you more sleepy, if possible. Notify if persistent daytime sleepiness occurs even with consistent use of CPAP.  Mask fitting ordered today - someone will call you to get this scheduled   Follow up in 6 weeks with Dr. Halford Chessman or Alanson Aly. If symptoms do not improve or worsen, please contact office for sooner follow up or seek emergency care.

## 2021-12-07 NOTE — Progress Notes (Signed)
$'@Patient's$  ID: Shannon Walker, female    DOB: December 27, 1932, 88 y.o.   MRN: [id number]  Chief Complaint  Patient presents with   Follow-up    Follow up on CPAP. Pt states she had some inner ear issues that are a little better now but not completely gone.    Referring provider: Bess Harvest*  HPI: 88 year old female, never smoker followed for moderate obstructive sleep apnea.  She is a patient of Dr. Juanetta Gosling and last seen in office on 08/01/2021 for sleep consult.  Past medical history significant for CAD, hypertension, bilateral carotid artery stenosis, AAA, NSTEMI, allergic rhinitis, GERD, hypothyroid, OA, HLD, prediabetes, vitamin D deficiency, obesity.  TEST/EVENTS:  09/25/2021 HST: AHI 22, SPO2 low 75%.  Moderate obstructive sleep apnea  08/01/2021: OV with Dr. Halford Chessman for sleep consult.  Reported that she had trouble falling asleep and staying asleep for many years.  Also reported snoring and witnessed apneas.  Excessive daytime fatigue symptoms.HST ordered for further eval.  10/06/2021: OV with Lakiesha Ralphs NP for follow after completing home sleep study which showed moderate obstructive sleep apnea. She reports that she sleeps around 6-8 hours a night, does wake up some throughout the night and wakes feeling like she didn't sleep well. She has also been told in the past that she has accelerations in her heart rate at night but never underwent formal sleep study. She occasionally has morning headaches. She denies drowsy driving, narcolepsy, or cataplexy. She does not take anything to help her fall asleep and doesn't use anything to keep herself awake during the day.  Reviewed risks of untreated OSA and treatment options. Started on auto CPAP 5-15 cmH2O.   12/07/2021: Today - follow up Patient presents today for follow up with her daughter after being started on CPAP therapy. She reports that she has been wearing her CPAP nightly, aside from a few nights recently when she was struggling with some  inner ear issues. She says that these have significantly improved and she was able to start back on her CPAP. She sleeps around 5 hours most nights now, wears CPAP the entire night. She is having some issues with her full face mask not feeling like it seals well. She has not tried any other masks. She wakes feeling a little more rested in the morning, but still feels like she didn't get a good nights sleep. She denies any drowsy driving or morning headaches.   11/06/2021-12/05/2021: CPAP auto 5-15 cmH2O 29/30 days; 73% >4 hr; av usage 4 hr 33 min Median pressure 6.3, 95th 10.5 Leaks median 49.7, 95th 116.9  AHI 12.6  Allergies  Allergen Reactions   Bee Venom Anaphylaxis   Atorvastatin     Myalgias on atorvastatin '80mg'$  daily   Celecoxib Other (See Comments)    Abdominal pain    Iodine Hives   Repatha [Evolocumab]     Muscle aches   Rosuvastatin     Myalgias on rosuvastatin '10mg'$  and '20mg'$  daily and '20mg'$  once weekly   Sulfamethoxazole-Trimethoprim Other (See Comments)    Restless legs, involuntary movements    Codeine Palpitations   Iodinated Contrast Media Rash   Nabumetone Other (See Comments) and Palpitations    Tachycardia     Immunization History  Administered Date(s) Administered   Influenza Split 02/25/2018   Pneumococcal Conjugate-13 04/08/2013    Past Medical History:  Diagnosis Date   Acquired hypothyroidism 09/16/2015   Allergic rhinitis due to pollen 11/02/2011   Aneurysm of abdominal aorta ([facility])  Arthritis, midfoot 09/16/2015   Bilateral carotid artery stenosis 12/09/2012   Calculus of kidney    Coronary artery disease    Cystocele, lateral    Cystocele, midline    Disorder of bone and cartilage, unspecified    Headache 12/09/2012   Hematuria, unspecified    Hypertension    Left foot pain 06/12/2017   Mixed hyperlipidemia 09/16/2015   Neoplasm of uncertain behavior of lip, oral cavity, 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   Occlusion and stenosis of carotid artery  12/09/2012   Osteopenia 09/16/2015   Palpitations 10/05/2013   Pre-syncope 10/05/2013   Prediabetes 09/16/2015   Subfibular impingement of left lower extremity 06/12/2017   SVT (supraventricular tachycardia) ([place]) 05/23/2017   Symptomatic PVCs 05/23/2017   Urge incontinence    Vaginal enterocele, congenital or acquired    Vitamin D deficiency     Tobacco History: Social History   Tobacco Use  Smoking Status Never   Passive exposure: Past  Smokeless Tobacco Never   Counseling given: Not Answered   Outpatient Medications Prior to Visit  Medication Sig Dispense Refill   ALPRAZolam (XANAX) 0.5 MG tablet Take 0.25 mg by mouth every 8 (eight) hours as needed for anxiety.     clopidogrel (PLAVIX) 75 MG tablet Take 1 tablet by mouth once daily 90 tablet 1   cyanocobalamin 100 MCG tablet Take 100 mcg by mouth daily.     isosorbide mononitrate (IMDUR) 60 MG 24 hr tablet Take 1 tablet (60 mg total) by mouth daily. 30 tablet 2   levothyroxine (SYNTHROID) 50 MCG tablet Take 50 mcg by mouth daily before breakfast.     Magnesium Gluconate 550 MG TABS Take 500 mg by mouth daily.     metoprolol tartrate (LOPRESSOR) 25 MG tablet Take 1 tablet (25 mg total) by mouth 2 (two) times daily. 180 tablet 3   nitroGLYCERIN (NITROSTAT) 0.4 MG SL tablet Place 1 tablet (0.4 mg total) under the tongue every 5 (five) minutes x 3 doses as needed for chest pain. 100 tablet 2   Vitamin D, Cholecalciferol, 1000 units CAPS Take 2,000 Units by mouth daily.      Alirocumab (PRALUENT) 75 MG/ML SOAJ Inject 1 pen. into the skin every 14 (fourteen) days. (Patient not taking: Reported on 10/06/2021) 2 mL 11   No facility-administered medications prior to visit.     Review of Systems:   Constitutional: No weight loss or gain, night sweats, fevers, chills. +excessive daytime fatigue (some improved) HEENT: No headaches, difficulty swallowing, tooth/dental problems, or sore throat. No sneezing, itching, ear ache, nasal  congestion, or post nasal drip. +morning headaches (resolved) CV:  No chest pain, orthopnea, PND, swelling in lower extremities, anasarca, dizziness, palpitations, syncope Resp: No shortness of breath with exertion or at rest. No excess mucus or change in color of mucus. No productive or non-productive. No hemoptysis. No wheezing.  No chest wall deformity Skin: No rash, lesions, ulcerations MSK:  No joint pain or swelling.  No decreased range of motion.  No back pain. Neuro: No dizziness or lightheadedness.  Psych: No depression or anxiety. Mood stable.     Physical Exam:  BP 110/82 (BP Location: Left Arm, Patient Position: Sitting, Cuff Size: Large)   Pulse 67   Temp 97.7 F (36.5 C) (Oral)   Ht '5\' 3"'$  (1.6 m)   Wt 177 lb 12.8 oz (80.6 kg)   SpO2 98%   BMI 31.50 kg/m   GEN: Pleasant, interactive, well-appearing; obese; in no acute distress. HEENT:  Normocephalic  and atraumatic. PERRLA. Sclera white. Nasal turbinates pink, moist and patent bilaterally. No rhinorrhea present. Oropharynx pink and moist, without exudate or edema. Edentulous - wears dentures. No lesions, ulcerations, or postnasal drip.  NECK:  Supple w/ fair ROM. No JVD present. Normal carotid impulses w/o bruits. Thyroid symmetrical with no goiter or nodules palpated. No lymphadenopathy.   CV: RRR, no m/r/g, no peripheral edema. Pulses intact, +2 bilaterally. No cyanosis, pallor or clubbing. PULMONARY:  Unlabored, regular breathing. Clear bilaterally A&P w/o wheezes/rales/rhonchi. No accessory muscle use. No dullness to percussion. GI: BS present and normoactive. Soft, non-tender to palpation. No organomegaly or masses detected. No CVA tenderness. MSK: No erythema, warmth or tenderness. Cap refil <2 sec all extrem. No deformities or joint swelling noted.  Neuro: A/Ox3. No focal deficits noted.   Skin: Warm, no lesions or rashe Psych: Normal affect and behavior. Judgement and thought content appropriate.     Lab  Results:  CBC    Component Value Date/Time   WBC 8.6 04/28/2021 1941   RBC 4.65 04/28/2021 1941   HGB 15.2 (H) 04/28/2021 1941   HCT 46.1 (H) 04/28/2021 1941   PLT 246 04/28/2021 1941   MCV 99.1 04/28/2021 1941   MCH 32.7 04/28/2021 1941   MCHC 33.0 04/28/2021 1941   RDW 13.3 04/28/2021 1941   LYMPHSABS 2.7 06/30/2014 0755   MONOABS 0.6 06/30/2014 0755   EOSABS 0.3 06/30/2014 0755   BASOSABS 0.0 06/30/2014 0755    BMET    Component Value Date/Time   NA 136 04/28/2021 1941   K 4.3 04/28/2021 1941   CL 105 04/28/2021 1941   CO2 22 04/28/2021 1941   GLUCOSE 118 (H) 04/28/2021 1941   BUN 21 04/28/2021 1941   CREATININE 0.83 04/28/2021 1941   CALCIUM 9.2 04/28/2021 1941   GFRNONAA >60 04/28/2021 1941   GFRAA >90 06/30/2014 0755    BNP    Component Value Date/Time   BNP 16.9 06/30/2014 0755     Imaging:  No results found.        No data to display          No results found for: "NITRICOXIDE"      Assessment & Plan:   Moderate obstructive sleep apnea Good compliance; however, she is having significant leaks and breakthrough events with full face mask. Advised she go for mask fitting to determine best fit mask for her. We are also going to adjust her pressures down to 5-12 cmH2O given her current requirements and leaks. If she is still having breakthrough, despite mask change and pressure adjustments, we will need to consider CPAP titration. Cautioned against drowsy driving in interim. She also is only getting around 5 hours of sleep a night - advised on increasing to 7-8 and better sleep hygiene.   Patient Instructions  Continue to use CPAP every night, minimum of 4-6 hours a night.  Change equipment every 30 days or as directed by DME. Wash your tubing with warm soap and water daily, hang to dry. Wash humidifier portion weekly.  Be aware of reduced alertness and do not drive or operate heavy machinery if experiencing this or drowsiness.  Exercise  encouraged, as tolerated. Avoid or decrease alcohol consumption and medications that make you more sleepy, if possible. Notify if persistent daytime sleepiness occurs even with consistent use of CPAP.  Mask fitting ordered today - someone will call you to get this scheduled   Follow up in 6 weeks with Dr. Halford Chessman or Alanson Aly. If  symptoms do not improve or worsen, please contact office for sooner follow up or seek emergency care.      I spent 32 minutes of dedicated to the care of this patient on the date of this encounter to include pre-visit review of records, face-to-face time with the patient discussing conditions above, post visit ordering of testing, clinical documentation with the electronic health record, making appropriate referrals as documented, and communicating necessary findings to members of the patients care team.  Clayton Bibles, NP 12/07/2021  Pt aware and understands NP's role.

## 2021-12-11 NOTE — Progress Notes (Unsigned)
Cardiology Office Note   Date:  12/13/2021   ID:  Shannon Walker, DOB [birth year], MRN [id number]  PCP:  Mayer Camel, NP    No chief complaint on file.  CAD  Wt Readings from Last 3 Encounters:  12/13/21 176 lb (79.8 kg)  12/07/21 177 lb 12.8 oz (80.6 kg)  10/06/21 185 lb 6.4 oz (84.1 kg)       History of Present Illness: Shannon Walker is a 88 y.o. female  with NSTEMI in 11/22, and cath showing 3 vessel disease.   Records show: "underwent LHC which showed severe triple-vessel disease-extensive discussion was held with patient by cardiology/cardiothoracic surgery-patient did not want to pursue high risk CABG-Dr. Marlowe Alt to the hospital again the day after discharge. Troponins were decreasing and she was discharge.  CP only when she rushes.  She did not tolerate the cholesterol med- rosuvastatin.  Ultimately, she was started on Repatha and her LDL decreased to 56.  She had some type of reaction.  She was switched to Praluent and also had a reaction- shortness of breath immediately after the shot.  She has gone back to Crestor. She is wondering about pravastatin.  IN the past 3 months, Denies : Chest pain. Dizziness. Leg edema. Nitroglycerin use. Orthopnea. Palpitations. Paroxysmal nocturnal dyspnea. Shortness of breath. Syncope.    Has some angina but it resolves with sitting, in a few minutes.  Still active in the garden and yard.  SHe has lost 30 lbs.    Past Medical History:  Diagnosis Date   Acquired hypothyroidism 09/16/2015   Allergic rhinitis due to pollen 11/02/2011   Aneurysm of abdominal aorta ([facility])    Arthritis, midfoot 09/16/2015   Bilateral carotid artery stenosis 12/09/2012   Calculus of kidney    Coronary artery disease    Cystocele, lateral    Cystocele, midline    Disorder of bone and cartilage, unspecified    Headache 12/09/2012   Hematuria, unspecified    Hypertension    Left foot pain 06/12/2017   Mixed  hyperlipidemia 09/16/2015   Neoplasm of uncertain behavior of lip, oral cavity, 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   Occlusion and stenosis of carotid artery 12/09/2012   Osteopenia 09/16/2015   Palpitations 10/05/2013   Pre-syncope 10/05/2013   Prediabetes 09/16/2015   Subfibular impingement of left lower extremity 06/12/2017   SVT (supraventricular tachycardia) ([facility]) 05/23/2017   Symptomatic PVCs 05/23/2017   Urge incontinence    Vaginal enterocele, congenital or acquired    Vitamin D deficiency     Past Surgical History:  Procedure Laterality Date   CYSTOSCOPY     LEFT HEART CATH AND CORONARY ANGIOGRAPHY N/A 04/24/2021   Procedure: LEFT HEART CATH AND CORONARY ANGIOGRAPHY;  Surgeon: Burnell Blanks, MD;  Location: [facility];  Service: Cardiovascular;  Laterality: N/A;   REPLACEMENT TOTAL KNEE       Current Outpatient Medications  Medication Sig Dispense Refill   ALPRAZolam (XANAX) 0.5 MG tablet Take 0.25 mg by mouth every 8 (eight) hours as needed for anxiety.     aspirin EC 81 MG tablet Take 1 tablet by mouth daily.     clopidogrel (PLAVIX) 75 MG tablet Take 1 tablet by mouth once daily 90 tablet 1   cyanocobalamin 100 MCG tablet Take 100 mcg by mouth daily.     isosorbide mononitrate (IMDUR) 60 MG 24 hr tablet Take 1 tablet (60 mg total) by mouth daily. 30 tablet 2   levothyroxine (  SYNTHROID) 50 MCG tablet Take 50 mcg by mouth daily before breakfast.     Magnesium Gluconate 550 MG TABS Take 500 mg by mouth daily.     metoprolol tartrate (LOPRESSOR) 25 MG tablet Take 1 tablet (25 mg total) by mouth 2 (two) times daily. 180 tablet 3   nitroGLYCERIN (NITROSTAT) 0.4 MG SL tablet Place 1 tablet (0.4 mg total) under the tongue every 5 (five) minutes x 3 doses as needed for chest pain. 100 tablet 2   Vitamin D, Cholecalciferol, 1000 units CAPS Take 2,000 Units by mouth daily.      No current facility-administered medications for this visit.    Allergies:   Bee venom,  Praluent [alirocumab], Atorvastatin, Celecoxib, Iodine, Repatha [evolocumab], Rosuvastatin, Sulfamethoxazole-trimethoprim, Codeine, Iodinated contrast media, and Nabumetone    Social History:  The patient  reports that she has never smoked. She has been exposed to tobacco smoke. She has never used smokeless tobacco. She reports that she does not drink alcohol and does not use drugs.   Family History:  The patient's family history includes Clotting disorder in her father; Diabetes in her brother and sister; Heart Problems in her sister; Heart attack in her mother.    ROS:  Please see the history of present illness.   Otherwise, review of systems are positive for intolerance to PCSK9 inhibitors.   All other systems are reviewed and negative.    PHYSICAL EXAM: VS:  BP 120/80 (BP Location: Left Arm, Patient Position: Sitting, Cuff Size: Normal)   Pulse 66   Ht 5' 3" (1.6 m)   Wt 176 lb (79.8 kg)   SpO2 97%   BMI 31.18 kg/m  , BMI Body mass index is 31.18 kg/m. GEN: Well nourished, well developed, in no acute distress HEENT: normal Neck: no JVD, carotid bruits, or masses Cardiac: RRR; no murmurs, rubs, or gallops,no edema  Respiratory:  clear to auscultation bilaterally, normal work of breathing GI: soft, nontender, nondistended, + BS MS: no deformity or atrophy Skin: warm and dry, no rash Neuro:  Strength and sensation are intact Psych: euthymic mood, full affect     Recent Labs: 04/23/2021: TSH 1.836 04/28/2021: BUN 21; Creatinine, Ser 0.83; Hemoglobin 15.2; Platelets 246; Potassium 4.3; Sodium 136 08/07/2021: ALT 12   Lipid Panel    Component Value Date/Time   CHOL 125 08/07/2021 0844   TRIG 201 (H) 08/07/2021 0844   HDL 40 08/07/2021 0844   CHOLHDL 3.1 08/07/2021 0844   CHOLHDL 4.9 04/26/2021 0338   VLDL 30 04/26/2021 0338   LDLCALC 52 08/07/2021 0844     Other studies Reviewed: Additional studies/ records that were reviewed today with results demonstrating: labs  reviewed- LDL was controlled on PCSK9 inhibitor. .   ASSESSMENT AND PLAN:  CAD/old MI: Angina on occasion but well controlled on medicines.   Hypertension: Avoid excessive salt.  Home BPs are well controlled.  801-655 range systolic.  Hyperlipidemia: Whole food, plant-based.  High-fiber diet.  Avoid processed foods.  Lipids today.  Intolerant to [employer].  She went back to Crestor but is unclear of the dose she is taking.  She will let us know. Morbid obesity: Intentional weight loss of 30 pounds.  Decreased meat intake. Anxiety: stable. Aortic atherosclerosis/AAA: 3.9 cm aneurysm in April 2022.  Will need repeat evaluation.     Current medicines are reviewed at length with the patient today.  The patient concerns regarding her medicines were addressed.  The following changes have been made:  No change  Labs/ tests ordered today include: Lipids, CBC, c-Met, AAA u/s No orders of the defined types were placed in this encounter.   Recommend 150 minutes/week of aerobic exercise Low fat, low carb, high fiber diet recommended  Disposition:   FU in 6 months   Signed, Larae Grooms, MD  12/13/2021 10:31 AM    [facility] [place], [place], [place]  [address] Phone: [phone]; Fax: [phone]

## 2021-12-13 ENCOUNTER — Ambulatory Visit: Payer: Medicare Other | Admitting: Interventional Cardiology

## 2021-12-13 ENCOUNTER — Encounter: Payer: Self-pay | Admitting: Interventional Cardiology

## 2021-12-13 VITALS — BP 120/80 | HR 66 | Ht 63.0 in | Wt 176.0 lb

## 2021-12-13 DIAGNOSIS — I700 Atherosclerosis of aorta: Secondary | ICD-10-CM

## 2021-12-13 DIAGNOSIS — T466X5D Adverse effect of antihyperlipidemic and antiarteriosclerotic drugs, subsequent encounter: Secondary | ICD-10-CM

## 2021-12-13 DIAGNOSIS — I252 Old myocardial infarction: Secondary | ICD-10-CM

## 2021-12-13 DIAGNOSIS — I10 Essential (primary) hypertension: Secondary | ICD-10-CM

## 2021-12-13 DIAGNOSIS — E782 Mixed hyperlipidemia: Secondary | ICD-10-CM

## 2021-12-13 DIAGNOSIS — I25118 Atherosclerotic heart disease of native coronary artery with other forms of angina pectoris: Secondary | ICD-10-CM

## 2021-12-13 DIAGNOSIS — I714 Abdominal aortic aneurysm, without rupture, unspecified: Secondary | ICD-10-CM

## 2021-12-13 DIAGNOSIS — G720 Drug-induced myopathy: Secondary | ICD-10-CM

## 2021-12-13 NOTE — Patient Instructions (Addendum)
Medication Instructions:  Your physician recommends that you continue on your current medications as directed. Please refer to the Current Medication list given to you today.  *If you need a refill on your cardiac medications before your next appointment, please call your pharmacy*   Lab Work: Lab work to be done today--Lipids,CMET and CBC If you have labs (blood work) drawn today and your tests are completely normal, you will receive your results only by: [facility] (if you have MyChart) OR A paper copy in the mail If you have any lab test that is abnormal or we need to change your treatment, we will call you to review the results.   Testing/Procedures: Your physician has requested that you have an abdominal aorta duplex. During this test, an ultrasound is used to evaluate the aorta. Allow 30 minutes for this exam. Do not eat after midnight the day before and avoid carbonated beverages    Follow-Up: At [facility], you and your health needs are our priority.  As part of our continuing mission to provide you with exceptional heart care, we have created designated Provider Care Teams.  These Care Teams include your primary Cardiologist (physician) and Advanced Practice Providers (APPs -  Physician Assistants and Nurse Practitioners) who all work together to provide you with the care you need, when you need it.  We recommend signing up for the patient portal called "MyChart".  Sign up information is provided on this After Visit Summary.  MyChart is used to connect with patients for Virtual Visits (Telemedicine).  Patients are able to view lab/test results, encounter notes, upcoming appointments, etc.  Non-urgent messages can be sent to your provider as well.   To learn more about what you can do with MyChart, go to [email or web address].    Your next appointment:   6 month(s)  The format for your next appointment:   In Person  Provider:   Larae Grooms, MD     Other  Instructions   Please call office and let us know dose of Rosuvastatin (Crestor) you are taking Important Information About Sugar

## 2021-12-14 LAB — COMPREHENSIVE METABOLIC PANEL
ALT: 12 IU/L (ref 0–32)
AST: 18 IU/L (ref 0–40)
Albumin/Globulin Ratio: 1.8 (ref 1.2–2.2)
Albumin: 4.1 g/dL (ref 3.7–4.7)
Alkaline Phosphatase: 85 IU/L (ref 44–121)
BUN/Creatinine Ratio: 17 (ref 12–28)
BUN: 11 mg/dL (ref 8–27)
Bilirubin Total: 0.4 mg/dL (ref 0.0–1.2)
CO2: 25 mmol/L (ref 20–29)
Calcium: 9.4 mg/dL (ref 8.7–10.3)
Chloride: 104 mmol/L (ref 96–106)
Creatinine, Ser: 0.66 mg/dL (ref 0.57–1.00)
Globulin, Total: 2.3 g/dL (ref 1.5–4.5)
Glucose: 94 mg/dL (ref 70–99)
Potassium: 4.6 mmol/L (ref 3.5–5.2)
Sodium: 142 mmol/L (ref 134–144)
Total Protein: 6.4 g/dL (ref 6.0–8.5)
eGFR: 85 mL/min/{1.73_m2} (ref >59)

## 2021-12-14 LAB — CBC
Hematocrit: 43.2 % (ref 34.0–46.6)
Hemoglobin: 14.5 g/dL (ref 11.1–15.9)
MCH: 33 pg (ref 26.6–33.0)
MCHC: 33.6 g/dL (ref 31.5–35.7)
MCV: 98 fL — ABNORMAL HIGH (ref 79–97)
Platelets: 268 10*3/uL (ref 150–450)
RBC: 4.4 x10E6/uL (ref 3.77–5.28)
RDW: 12.8 % (ref 11.7–15.4)
WBC: 6.5 10*3/uL (ref 3.4–10.8)

## 2021-12-14 LAB — LIPID PANEL
Chol/HDL Ratio: 3.6 ratio (ref 0.0–4.4)
Cholesterol, Total: 138 mg/dL (ref 100–199)
HDL: 38 mg/dL — ABNORMAL LOW (ref >39)
LDL Chol Calc (NIH): 66 mg/dL (ref 0–99)
Triglycerides: 202 mg/dL — ABNORMAL HIGH (ref 0–149)
VLDL Cholesterol Cal: 34 mg/dL (ref 5–40)

## 2021-12-18 ENCOUNTER — Other Ambulatory Visit: Payer: Self-pay | Admitting: *Deleted

## 2021-12-18 MED ORDER — ROSUVASTATIN CALCIUM 5 MG PO TABS: 5.0000 mg | ORAL_TABLET | Freq: Every day | ORAL | 3 refills | Status: DC

## 2021-12-18 NOTE — Progress Notes (Signed)
Patient reports she is taking Crestor 5 mg daily

## 2021-12-29 ENCOUNTER — Ambulatory Visit (HOSPITAL_COMMUNITY)
Admission: RE | Admit: 2021-12-29 | Discharge: 2021-12-29 | Disposition: A | Discharge status: Home / Self Care | Payer: Medicare Other | Source: Ambulatory Visit | Attending: Cardiology | Admitting: Cardiology

## 2021-12-29 DIAGNOSIS — I7143 Infrarenal abdominal aortic aneurysm, without rupture: Secondary | ICD-10-CM

## 2021-12-29 DIAGNOSIS — I714 Abdominal aortic aneurysm, without rupture, unspecified: Secondary | ICD-10-CM | POA: Insufficient documentation

## 2022-01-03 ENCOUNTER — Telehealth: Payer: Self-pay | Admitting: *Deleted

## 2022-01-03 DIAGNOSIS — I714 Abdominal aortic aneurysm, without rupture, unspecified: Secondary | ICD-10-CM

## 2022-01-03 NOTE — Telephone Encounter (Signed)
-----   Message from Jettie Booze, MD sent at 12/31/2021  9:48 PM EDT ----- Cynda Familia. Repeat study in 1 year

## 2022-01-03 NOTE — Telephone Encounter (Signed)
Left message to call office

## 2022-01-04 NOTE — Telephone Encounter (Signed)
I spoke with patient and reviewed AAA ultrasound results with her.

## 2022-01-04 NOTE — Telephone Encounter (Signed)
Pt returning nurse's call. Please advise

## 2022-01-16 ENCOUNTER — Other Ambulatory Visit (HOSPITAL_BASED_OUTPATIENT_CLINIC_OR_DEPARTMENT_OTHER): Payer: Medicare Other | Admitting: Pulmonary Disease

## 2022-01-22 ENCOUNTER — Ambulatory Visit: Payer: Medicare Other | Admitting: Pulmonary Disease

## 2022-01-22 ENCOUNTER — Ambulatory Visit (HOSPITAL_BASED_OUTPATIENT_CLINIC_OR_DEPARTMENT_OTHER): Payer: Medicare Other | Attending: Nurse Practitioner | Admitting: Pulmonary Disease

## 2022-01-22 DIAGNOSIS — G4733 Obstructive sleep apnea (adult) (pediatric): Secondary | ICD-10-CM

## 2022-01-23 ENCOUNTER — Telehealth: Payer: Self-pay | Admitting: Pulmonary Disease

## 2022-01-24 NOTE — Procedures (Signed)
  Mask desensitization.  She tried Office manager (S/M) and Fisher Paykel Simplus (small).  She preferred the Evora mask.  She was provided both.

## 2022-01-26 NOTE — Telephone Encounter (Signed)
I left a message for the patient to call back about the message and to get clarification.

## 2022-01-31 NOTE — Telephone Encounter (Signed)
She has an OV coming up and can discuss at the office visit.

## 2022-02-05 ENCOUNTER — Encounter: Payer: Self-pay | Admitting: Pulmonary Disease

## 2022-02-05 ENCOUNTER — Ambulatory Visit: Payer: Medicare Other | Admitting: Pulmonary Disease

## 2022-02-05 VITALS — BP 128/80 | HR 58 | Ht 63.0 in | Wt 173.4 lb

## 2022-02-05 DIAGNOSIS — G4733 Obstructive sleep apnea (adult) (pediatric): Secondary | ICD-10-CM | POA: Diagnosis not present

## 2022-02-05 NOTE — Patient Instructions (Signed)
Will send an order so that you keep getting the [place] CPAP mask.  Will have [employer] change your auto CPAP setting to 5 - 7 cm water pressure.  Follow up in 6 months.

## 2022-02-05 NOTE — Progress Notes (Signed)
[facility]  Chief Complaint  Patient presents with   Follow-up    Getting to much pressure with cpap    Past Surgical History:  She  has a past surgical history that includes Replacement total knee; Cystoscopy; and LEFT HEART CATH AND CORONARY ANGIOGRAPHY (N/A, 04/24/2021).  Past Medical History:  Hypothyroidism, Allergies, Aortic aneurysm, Nephrolithiasis, CAD, Cystocele, Hypertension, HLD, Osteopenia, Pre DM, SVT, Vit D deficiency  Constitutional:  BP 128/80 (BP Location: Left Arm, Cuff Size: Normal)   Pulse (!) 58   Ht '5\' 3"'$  (1.6 m)   Wt 173 lb 6.4 oz (78.7 kg)   SpO2 96%   BMI 30.72 kg/m   Brief Summary:  Shannon Walker is a 88 y.o. female with obstructive sleep apnea.      Subjective:   She is here with her daughter.  Home sleep study showed moderate sleep apnea.  She was started on auto CPAP.  She had mask refitting at sleep lab and likes the hybrid mask.  Feels like pressure is too high, mask leaks, and she feels pressure in her chest.   Physical Exam:   Appearance - well kempt   ENMT - no sinus tenderness, no oral exudate, no LAN, Mallampati 3 airway, no stridor, wears dentures  Respiratory - equal breath sounds bilaterally, no wheezing or rales  CV - s1s2 regular rate and rhythm, no murmurs  Ext - no clubbing, no edema  Skin - no rashes  Psych - normal mood and affect    Chest Imaging:  CT angio chest 04/23/21 >> atherosclerosis, elevated Rt diaphragm, ATX, small HH  Sleep Tests:  HST 09/25/21>> AHI 22, SpO2 low 75% Auto CPAP 01/05/22 to 01/19/22 >> used on 14 of 15 nights with average 3 hrs 14 min.  Average AHI 4.5 with median CPAP 7 and 95 th percentile CPAP 11 cm H2O  Cardiac Tests:  Echo 04/24/21 >> EF 65 to 70%, mild LVH  Social History:  She  reports that she has never smoked. She has been exposed to tobacco smoke. She has never used smokeless tobacco. She reports that she does not drink  alcohol and does not use drugs.  Family History:  Her family history includes Clotting disorder in her father; Diabetes in her brother and sister; Heart Problems in her sister; Heart attack in her mother.     Assessment/Plan:   Obstructive sleep apnea. - she is compliant with CPAP and reports benefit from therapy - uses [employer] for her DME - current CPAP ordered May 2023 - she is having mask leak and chest pressure - continue [place] S/M hybrid mask - will change auto CPAP to 5 -7 cm H2O  Coronary artery disease. - she is followed by Dr. Casandra Doffing with cardiology  Time Spent Involved in Patient Care on Day of Examination:  27 minutes  Follow up:   Patient Instructions  Will send an order so that you keep getting the [place] CPAP mask.  Will have [employer] change your auto CPAP setting to 5 - 7 cm water pressure.  Follow up in 6 months.  Medication List:   Allergies as of 02/05/2022       Reactions   Bee Venom Anaphylaxis   Praluent [alirocumab] Shortness Of Breath   Atorvastatin    Myalgias on atorvastatin '80mg'$  daily   Celecoxib Other (See Comments)   Abdominal pain   Iodine Hives   Repatha [evolocumab]  Muscle aches   Rosuvastatin    Myalgias on rosuvastatin '10mg'$  and '20mg'$  daily and '20mg'$  once weekly   Sulfamethoxazole-trimethoprim Other (See Comments)   Restless legs, involuntary movements   Codeine Palpitations   Iodinated Contrast Media Rash   Nabumetone Other (See Comments), Palpitations   Tachycardia        Medication List        Accurate as of February 05, 2022 10:14 AM. If you have any questions, ask your nurse or doctor.          ALPRAZolam 0.5 MG tablet Commonly known as: XANAX Take 0.25 mg by mouth every 8 (eight) hours as needed for anxiety.   aspirin EC 81 MG tablet Take 1 tablet by mouth daily.   clopidogrel 75 MG tablet Commonly known as: PLAVIX Take 1 tablet by mouth once daily   cyanocobalamin 100 MCG  tablet Take 100 mcg by mouth daily.   isosorbide mononitrate 60 MG 24 hr tablet Commonly known as: IMDUR Take 1 tablet (60 mg total) by mouth daily.   levothyroxine 50 MCG tablet Commonly known as: SYNTHROID Take 50 mcg by mouth daily before breakfast.   Magnesium Gluconate 550 MG Tabs Take 500 mg by mouth daily.   metoprolol tartrate 25 MG tablet Commonly known as: LOPRESSOR Take 1 tablet (25 mg total) by mouth 2 (two) times daily.   nitroGLYCERIN 0.4 MG SL tablet Commonly known as: NITROSTAT Place 1 tablet (0.4 mg total) under the tongue every 5 (five) minutes x 3 doses as needed for chest pain.   rosuvastatin 5 MG tablet Commonly known as: CRESTOR Take 1 tablet (5 mg total) by mouth daily.   Vitamin D (Cholecalciferol) 25 MCG (1000 UT) Caps Take 2,000 Units by mouth daily.        Signature:  Chesley Mires, MD [place] Pager - [phone] 02/05/2022, 10:14 AM

## 2022-02-26 ENCOUNTER — Other Ambulatory Visit: Payer: Self-pay | Admitting: Specialist

## 2022-02-26 DIAGNOSIS — Z1231 Encounter for screening mammogram for malignant neoplasm of breast: Secondary | ICD-10-CM

## 2022-04-03 ENCOUNTER — Ambulatory Visit
Admission: RE | Admit: 2022-04-03 | Discharge: 2022-04-03 | Disposition: A | Discharge status: Home / Self Care | Payer: Medicare Other | Source: Ambulatory Visit | Attending: Specialist | Admitting: Specialist

## 2022-04-03 DIAGNOSIS — Z1231 Encounter for screening mammogram for malignant neoplasm of breast: Secondary | ICD-10-CM

## 2022-05-04 ENCOUNTER — Other Ambulatory Visit: Payer: Self-pay | Admitting: Student

## 2022-05-16 DIAGNOSIS — I214 Non-ST elevation (NSTEMI) myocardial infarction: Secondary | ICD-10-CM

## 2022-05-16 DIAGNOSIS — R079 Chest pain, unspecified: Secondary | ICD-10-CM

## 2022-05-16 DIAGNOSIS — I340 Nonrheumatic mitral (valve) insufficiency: Secondary | ICD-10-CM

## 2022-05-16 DIAGNOSIS — I361 Nonrheumatic tricuspid (valve) insufficiency: Secondary | ICD-10-CM

## 2022-05-16 DIAGNOSIS — E785 Hyperlipidemia, unspecified: Secondary | ICD-10-CM

## 2022-05-16 DIAGNOSIS — I10 Essential (primary) hypertension: Secondary | ICD-10-CM

## 2022-05-17 DIAGNOSIS — I214 Non-ST elevation (NSTEMI) myocardial infarction: Secondary | ICD-10-CM

## 2022-05-17 DIAGNOSIS — I10 Essential (primary) hypertension: Secondary | ICD-10-CM

## 2022-05-17 DIAGNOSIS — E785 Hyperlipidemia, unspecified: Secondary | ICD-10-CM

## 2022-05-18 DIAGNOSIS — I10 Essential (primary) hypertension: Secondary | ICD-10-CM | POA: Diagnosis not present

## 2022-05-18 DIAGNOSIS — E785 Hyperlipidemia, unspecified: Secondary | ICD-10-CM | POA: Diagnosis not present

## 2022-05-18 DIAGNOSIS — I214 Non-ST elevation (NSTEMI) myocardial infarction: Secondary | ICD-10-CM | POA: Diagnosis not present

## 2022-05-24 NOTE — Progress Notes (Signed)
Cardiology Office Note:    Date:  05/25/2022   ID:  Shannon Walker, DOB 29-Aug-1932, MRN [id number]  PCP:  Shannon Camel, NP   [facility] Providers Cardiologist:  Larae Grooms, MD     Referring MD: Bess Harvest*   Chief Complaint: hospital follow-up  History of Present Illness:    Shannon Walker is a very pleasant 88 y.o. female with a hx of CAD s/p NSTEMI 03/2021, HTN, HLD, obesity, bilateral carotid artery stenosis, AAA with 3.9 cm aneurysm on ultrasound 12/2021  Admission 11/27-12/05/2020 with NSTEMI. Left heart cath revealed 3 vessel disease including severe distal left main disease. Extensive discussion was held with patient by cardiology/cardiothoracic surgery and patient did not want to pursue high risk CABG, was not felt to be a candidate for PCI.  Echo during admission revealed LVEF 65 to 70% with normal wall motion and mild LVH.  Returned to the hospital again the day after discharge. Troponins were decreasing and she was discharged. Experiencing chest pain only when she rushes. Intolerant of rosuvastatin.  She was referred to Pharm.D. for lipid management and was started on Repatha with LDL decreased to 56.  She had a reaction and was switched to Praluent.  Had a shortness of breath immediately following the injection. Resumed Crestor.   Last cardiology clinic visit was 12/13/2021 with Dr. Irish Lack. She reported some angina but it resolves with sitting for a few minutes.  Remained active in the garden and in the yard. She reported intentional 30 pound weight loss. Was advised to undergo repeat AAA duplex which revealed stable aneurysm at 3.9 cm. Advised to return in 6 months for follow-up.  Today, she is here with her daughter for follow-up. Reports she was admitted to [facility] for MI on 12/20-12/22/23.  Unfortunately I do not have access to any of these records. Will call to request records be faxed to [place]. She was at home and checked her  O2 sat with her pulse oximeter, got very upset about a reading in the 70s. She started to have chest pain and went to the hospital. Was found to have elevated troponins. Reports no intervention was done but she did have an echo.  Ranexa was added for angina, but she noted burning in her bladder each time she took it and has stopped it.  She reports she is feeling well and is not having any episodes of chest pain. No dyspnea, orthopnea, PND, edema, palpitations, presyncope or syncope. Feels like she sleeps better in a recliner now, is compliant with CPAP. Home BP well controlled with SBP 99-120 and DBP 50-70s, HR 60s. She is tolerating rosuvastatin 3 days/week. No specific cardiac concerns today.   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   Occlusion and stenosis of carotid artery 12/09/2012   Osteopenia 09/16/2015   Palpitations 10/05/2013   Pre-syncope 10/05/2013   Prediabetes 09/16/2015   Subfibular  impingement of left lower extremity 06/12/2017   SVT (supraventricular tachycardia) 05/23/2017   Symptomatic PVCs 05/23/2017   Urge incontinence    Vaginal enterocele, congenital or acquired    Vitamin D deficiency     Past Surgical History:  Procedure Laterality Date   CYSTOSCOPY     LEFT HEART CATH AND CORONARY ANGIOGRAPHY N/A 04/24/2021   Procedure: LEFT HEART CATH AND CORONARY ANGIOGRAPHY;  Surgeon: Burnell Blanks, MD;  Location: [facility];  Service: Cardiovascular;  Laterality: N/A;   REPLACEMENT TOTAL KNEE      Current Medications: Current Meds  Medication Sig   ALPRAZolam (XANAX) 0.5 MG tablet Take 0.25 mg by mouth every 8 (eight) hours as needed for anxiety.   aspirin EC 81 MG tablet Take 1 tablet by mouth daily.   clopidogrel (PLAVIX) 75 MG tablet Take 1 tablet by mouth once daily   cyanocobalamin 100 MCG tablet Take 100 mcg by mouth daily.   isosorbide mononitrate (IMDUR) 30 MG 24 hr tablet Take 30 mg by mouth 2 (two) times daily.   levothyroxine (SYNTHROID) 50 MCG tablet Take 50 mcg by mouth daily before breakfast.   Magnesium Gluconate 550 MG TABS Take 500 mg by mouth daily.   metoprolol tartrate (LOPRESSOR) 25 MG tablet Take 1 tablet (25 mg total) by mouth 2 (two) times daily.   nitroGLYCERIN (NITROSTAT) 0.4 MG SL tablet Place 1 tablet (0.4 mg total) under the tongue every 5 (five) minutes x 3 doses as needed for chest pain.   Vitamin D, Cholecalciferol, 1000 units CAPS Take 2,000 Units by mouth daily.    [DISCONTINUED] rosuvastatin (CRESTOR) 5 MG tablet Take 1 tablet (5 mg total) by mouth daily.     Allergies:   Bee venom, Praluent [alirocumab], Atorvastatin, Celecoxib, Iodine, Repatha [evolocumab], Rosuvastatin, Sulfamethoxazole-trimethoprim, Codeine, Iodinated contrast media, and Nabumetone   Social History   Socioeconomic History   Marital status: Married    Spouse name: Not on file   Number of children: Not on file   Years of education: Not on file   Highest education level: Not on file  Occupational History   Not on file  Tobacco Use   Smoking status: Never    Passive exposure: Past   Smokeless tobacco: Never  Vaping Use   Vaping Use: Never used  Substance and Sexual Activity   Alcohol use: No   Drug use: No   Sexual activity: Not on file  Other Topics Concern   Not on file  Social History Narrative   Not on file   Social Determinants of Health   Financial Resource Strain: Not on  file  Food Insecurity: Not on file  Transportation Needs: Not on file  Physical Activity: Not on file  Stress: Not on file  Social Connections: Not on file     Family History: The patient's family history includes Clotting disorder in her father; Diabetes in her brother and sister; Heart Problems in her sister; Heart attack in her mother.  ROS:   Please see the history of present illness.   All other systems reviewed and are negative.  Labs/Other Studies Reviewed:    The following studies were reviewed today:  AAA Duplex 12/29/21 Abdominal Aorta: There is evidence of abnormal dilatation of the distal  Abdominal aorta. The largest aortic measurement is 3.5 cm. The largest  aortic diameter remains essentially unchanged compared to prior exam.  Previous diameter measurement was 3.8 cm  obtained on 08/2020.  IVC/Iliac: There is no  evidence of thrombus involving the IVC.    *See table(s) above for measurements and observations.  Suggest follow up study in 12 months.   Echo 04/24/21 1. Global longitudinal strain is -15.2%. Left ventricular ejection  fraction, by estimation, is 65 to 70%. The left ventricle has normal  function. The left ventricle has no regional wall motion abnormalities.  There is mild left ventricular hypertrophy.  Left ventricular diastolic parameters are indeterminate.   2. Right ventricular systolic function is normal. The right ventricular  size is normal.   3. Trivial mitral valve regurgitation. Moderate mitral annular  calcification.   4. Aortic valve regurgitation is not visualized. Aortic valve sclerosis  is present, with no evidence of aortic valve stenosis.   5. The inferior vena cava is normal in size with greater than 50%  respiratory variability, suggesting right atrial pressure of 3 mmHg.    LHC 04/24/21   Mid RCA lesion is 99% stenosed.   Prox RCA lesion is 40% stenosed.   Ost LM to Dist LM lesion is 60% stenosed.   Dist LM to Prox LAD lesion  is 60% stenosed.   Mid LAD lesion is 99% stenosed.   1st Diag lesion is 60% stenosed.   Moderate to severe distal left main stenosis Moderate, heavily calcified ostial LAD stenosis. Severe heavily calcified mid LAD stenosis just past the takeoff of the diagonal branch. The diagonal branch has diffuse moderate stenosis. Mild non-obstructive disease in the Circumflex.  The RCA is a large dominant vessel with heavy calcification in the proximal and mid vessel. The mid RCA has a severe, heavily calcified stenosis that is a functional CTO. The distal RCA branches fill from antegrade flow and from left to right collaterals.  LVEDP 7 mmHg   Recommendations: She has severe distal left main disease and severe, calcific disease in the RCA and LAD. Her disease would be best treated with CABG as I am not sure we would have a favorable result with PCI. She will be a high risk candidate for CABG given her age but overall she seems relatively functional. PCI will not be a good option for revascularization. Will ask CT surgery to see her for CABG. Resume IV heparin 8 hours post sheath pull. Continue ASA, statin and beta blocker.   Diagnostic Dominance: Right    Recent Labs: 12/13/2021: ALT 12; BUN 11; Creatinine, Ser 0.66; Hemoglobin 14.5; Platelets 268; Potassium 4.6; Sodium 142  Recent Lipid Panel    Component Value Date/Time   CHOL 138 12/13/2021 1107   TRIG 202 (H) 12/13/2021 1107   HDL 38 (L) 12/13/2021 1107   CHOLHDL 3.6 12/13/2021 1107   CHOLHDL 4.9 04/26/2021 0338   VLDL 30 04/26/2021 0338   LDLCALC 66 12/13/2021 1107     Risk Assessment/Calculations:       Physical Exam:    VS:  BP 124/68   Pulse 66   Ht '5\' 3"'$  (1.6 m)   Wt 167 lb 6.4 oz (75.9 kg)   SpO2 99%   BMI 29.65 kg/m     Wt Readings from Last 3 Encounters:  05/25/22 167 lb 6.4 oz (75.9 kg)  02/05/22 173 lb 6.4 oz (78.7 kg)  01/22/22 177 lb (80.3 kg)     GEN:  Well nourished, well developed in no acute  distress HEENT: Normal NECK: No JVD; No carotid bruits CARDIAC: RRR, no murmurs, rubs, gallops RESPIRATORY:  Clear to auscultation without rales, wheezing or rhonchi  ABDOMEN: Soft, non-tender, non-distended MUSCULOSKELETAL:  No  edema; No deformity. 2+ pedal pulses, equal bilaterally SKIN: Warm and dry NEUROLOGIC:  Alert and oriented x 3 PSYCHIATRIC:  Normal affect   EKG:  EKG is ordered today.  The ekg ordered today demonstrates sinus rhythm at 66 bpm with fusion complexes noted, nonspecific ST abnormality, no acute change from previous tracing  Diagnoses:    1. Coronary artery disease of bypass graft of native heart with stable angina pectoris ([place])   2. Aortic atherosclerosis ([place])   3. Essential hypertension   4. Hyperlipidemia LDL goal <70   5. Statin myopathy   6. Abdominal aortic aneurysm (AAA) without rupture, unspecified part ([place])    Assessment and Plan:     CAD with stable angina: Severe multivessel disease as outlined above.  Recent admission to [facility] with reported NSTEMI. Have requested records for review. She denies chest pain, dyspnea, or other symptoms concerning for angina.  No indication for further ischemic evaluation at this time. Intolerant of Ranexa (bladder burning).  Symptoms well-controlled on current regimen of isosorbide 30 mg twice daily, clopidogrel, aspirin, metoprolol. Tolerating rosuvastatin 3 days/week. No bleeding concerns.  Hypertension: BP is well controlled.   Hyperlipidemia LDL goal < 70: History of statin myopathy. LDL 48 on 05/01/22. Continue rosuvastatin 5 mg 3 days weekly.  AAA: Dilatation of distal abdominal aorta largest measurement 3.5 cm on u/s 12/29/21, unchanged from previous exam. Recommendation to repeat imaging 12/2022.      Disposition: Keep your February appointment with Dr. Irish Lack  Medication Adjustments/Labs and Tests Ordered: Current medicines are reviewed at length with the patient today.  Concerns regarding  medicines are outlined above.  Orders Placed This Encounter  Procedures   EKG 12-Lead   Meds ordered this encounter  Medications   rosuvastatin (CRESTOR) 5 MG tablet    Sig: Take 1 tablet (5 mg total) by mouth 3 (three) times a week.    Dispense:  35 tablet    Refill:  3    Order Specific Question:   Supervising Provider    Answer:   Thayer Headings [phone]    Patient Instructions  Medication Instructions:   Your physician recommends that you continue on your current medications as directed. Please refer to the Current Medication list given to you today.   *If you need a refill on your cardiac medications before your next appointment, please call your pharmacy*   Lab Work:  None ordered.  If you have labs (blood work) drawn today and your tests are completely normal, you will receive your results only by: [place] (if you have MyChart) OR A paper copy in the mail If you have any lab test that is abnormal or we need to change your treatment, we will call you to review the results.   Testing/Procedures:  None ordered.   Follow-Up: At [facility], you and your health needs are our priority.  As part of our continuing mission to provide you with exceptional heart care, we have created designated Provider Care Teams.  These Care Teams include your primary Cardiologist (physician) and Advanced Practice Providers (APPs -  Physician Assistants and Nurse Practitioners) who all work together to provide you with the care you need, when you need it.  We recommend signing up for the patient portal called "MyChart".  Sign up information is provided on this After Visit Summary.  MyChart is used to connect with patients for Virtual Visits (Telemedicine).  Patients are able to view lab/test results, encounter notes, upcoming appointments, etc.  Non-urgent messages can be sent to your provider as well.   To learn more about what you can do with MyChart, go to  [email or web address].    Your next appointment:   2 month(s)  The format for your next appointment:   In Person  Provider:   Larae Grooms, MD       Important Information About Sugar         Signed, Emmaline Life, NP  05/25/2022 9:36 AM    [place]

## 2022-05-25 ENCOUNTER — Encounter: Payer: Self-pay | Admitting: Nurse Practitioner

## 2022-05-25 ENCOUNTER — Telehealth: Payer: Self-pay | Admitting: *Deleted

## 2022-05-25 ENCOUNTER — Ambulatory Visit: Payer: Medicare Other | Attending: Nurse Practitioner | Admitting: Nurse Practitioner

## 2022-05-25 VITALS — BP 124/68 | HR 66 | Ht 63.0 in | Wt 167.4 lb

## 2022-05-25 DIAGNOSIS — I25708 Atherosclerosis of coronary artery bypass graft(s), unspecified, with other forms of angina pectoris: Secondary | ICD-10-CM

## 2022-05-25 DIAGNOSIS — I10 Essential (primary) hypertension: Secondary | ICD-10-CM | POA: Diagnosis not present

## 2022-05-25 DIAGNOSIS — I714 Abdominal aortic aneurysm, without rupture, unspecified: Secondary | ICD-10-CM

## 2022-05-25 DIAGNOSIS — E785 Hyperlipidemia, unspecified: Secondary | ICD-10-CM | POA: Diagnosis not present

## 2022-05-25 DIAGNOSIS — I700 Atherosclerosis of aorta: Secondary | ICD-10-CM

## 2022-05-25 DIAGNOSIS — G720 Drug-induced myopathy: Secondary | ICD-10-CM

## 2022-05-25 DIAGNOSIS — T466X5D Adverse effect of antihyperlipidemic and antiarteriosclerotic drugs, subsequent encounter: Secondary | ICD-10-CM

## 2022-05-25 MED ORDER — ROSUVASTATIN CALCIUM 5 MG PO TABS: 5.0000 mg | ORAL_TABLET | ORAL | 3 refills | Status: DC

## 2022-05-25 NOTE — Patient Instructions (Signed)
Medication Instructions:   Your physician recommends that you continue on your current medications as directed. Please refer to the Current Medication list given to you today.   *If you need a refill on your cardiac medications before your next appointment, please call your pharmacy*   Lab Work:  None ordered.  If you have labs (blood work) drawn today and your tests are completely normal, you will receive your results only by: [place] (if you have MyChart) OR A paper copy in the mail If you have any lab test that is abnormal or we need to change your treatment, we will call you to review the results.   Testing/Procedures:  None ordered.   Follow-Up: At [facility], you and your health needs are our priority.  As part of our continuing mission to provide you with exceptional heart care, we have created designated Provider Care Teams.  These Care Teams include your primary Cardiologist (physician) and Advanced Practice Providers (APPs -  Physician Assistants and Nurse Practitioners) who all work together to provide you with the care you need, when you need it.  We recommend signing up for the patient portal called "MyChart".  Sign up information is provided on this After Visit Summary.  MyChart is used to connect with patients for Virtual Visits (Telemedicine).  Patients are able to view lab/test results, encounter notes, upcoming appointments, etc.  Non-urgent messages can be sent to your provider as well.   To learn more about what you can do with MyChart, go to [email or web address].    Your next appointment:   2 month(s)  The format for your next appointment:   In Person  Provider:   Larae Grooms, MD       Important Information About [place]

## 2022-05-25 NOTE — Telephone Encounter (Signed)
Med Rec release form was filled out today to request pt's recent hospitalization at [facility] for D/C summary, echo, and labs. Sent to Med Rec Dept.

## 2022-06-27 ENCOUNTER — Other Ambulatory Visit: Payer: Self-pay | Admitting: Interventional Cardiology

## 2022-06-27 ENCOUNTER — Other Ambulatory Visit: Payer: Self-pay | Admitting: Student

## 2022-06-30 NOTE — Progress Notes (Unsigned)
Cardiology Office Note   Date:  07/02/2022   ID:  Shannon, Walker Sep 28, 1932, MRN [id number]  PCP:  Mayer Camel, NP    No chief complaint on file.  CAD  Wt Readings from Last 3 Encounters:  07/02/22 157 lb 3.2 oz (71.3 kg)  05/25/22 167 lb 6.4 oz (75.9 kg)  02/05/22 173 lb 6.4 oz (78.7 kg)       History of Present Illness: Shannon Walker is a 89 y.o. female  with NSTEMI in 11/22, and cath showing 3 vessel disease.   Records show: "underwent LHC which showed severe triple-vessel disease-extensive discussion was held with patient by cardiology/cardiothoracic surgery-patient did not want to pursue high risk CABG-Dr. Marlowe Alt to the hospital again the day after discharge. Troponins were decreasing and she was discharge.  CP only when she rushes.  She did not tolerate the cholesterol med- rosuvastatin.   Ultimately, she was started on Repatha and her LDL decreased to 56.  She had some type of reaction.  She was switched to Praluent and also had a reaction- shortness of breath immediately after the shot.  She has gone back to Crestor. She is wondering about pravastatin.   In 2023, she lost 50 pounds.  Eats a lot of fruits and vegetable.   Has rare chest discomfort but this resolves quickly with rest.   She walks occasionally with a shopping cart.  Feels better with the support.  Balance has decreased but no recent falls.  Uses a cane and walker as well.   Denies : Chest pain. Dizziness. Leg edema. Orthopnea. Palpitations. Paroxysmal nocturnal dyspnea. Shortness of breath. Syncope.    Rare Nitroglycerin use, every few months, sx resolved with 1 tab.  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   Occlusion and stenosis of carotid artery 12/09/2012   Osteopenia 09/16/2015   Palpitations 10/05/2013   Pre-syncope 10/05/2013   Prediabetes 09/16/2015   Subfibular impingement of left lower extremity 06/12/2017   SVT (supraventricular tachycardia) 05/23/2017   Symptomatic PVCs 05/23/2017   Urge incontinence    Vaginal enterocele, congenital or acquired    Vitamin D deficiency     Past Surgical History:  Procedure Laterality Date   CYSTOSCOPY     LEFT HEART CATH AND CORONARY ANGIOGRAPHY N/A 04/24/2021   Procedure: LEFT HEART CATH AND CORONARY ANGIOGRAPHY;  Surgeon: Burnell Blanks, MD;  Location: [facility];  Service: Cardiovascular;  Laterality: N/A;   REPLACEMENT TOTAL KNEE       Current Outpatient Medications  Medication Sig Dispense Refill   ALPRAZolam (XANAX) 0.5 MG tablet Take 0.25 mg by mouth every 8 (eight) hours as needed for anxiety.     aspirin EC 81 MG tablet Take 1 tablet by mouth daily.     clopidogrel (PLAVIX) 75 MG tablet Take 1 tablet by mouth once daily 90 tablet 3  cyanocobalamin 100 MCG tablet Take 100 mcg by mouth daily.     isosorbide mononitrate (IMDUR) 30 MG 24 hr tablet Take 30 mg by mouth 2 (two) times daily.     levothyroxine (SYNTHROID) 50 MCG tablet Take 50 mcg by mouth daily before breakfast.     Magnesium Gluconate 550 MG TABS Take 500 mg by mouth daily.     metoprolol tartrate (LOPRESSOR) 25 MG tablet Take 1 tablet by mouth twice daily 180 tablet 3   nitroGLYCERIN (NITROSTAT) 0.4 MG SL tablet DISSOLVE ONE TABLET UNDER THE TONGUE EVERY 5 MINUTES AS NEEDED FOR CHEST PAIN.  DO NOT EXCEED A TOTAL OF 3 DOSES IN 15 MINUTES 75 tablet 2   Vitamin  D, Cholecalciferol, 1000 units CAPS Take 2,000 Units by mouth daily.      rosuvastatin (CRESTOR) 5 MG tablet Take 1 tablet (5 mg total) by mouth 3 (three) times a week. (Patient not taking: Reported on 07/02/2022) 35 tablet 3   No current facility-administered medications for this visit.    Allergies:   Bee venom, Praluent [alirocumab], Atorvastatin, Celecoxib, Iodine, Repatha [evolocumab], Rosuvastatin, Sulfamethoxazole-trimethoprim, Codeine, Iodinated contrast media, and Nabumetone    Social History:  The patient  reports that she has never smoked. She has been exposed to tobacco smoke. She has never used smokeless tobacco. She reports that she does not drink alcohol and does not use drugs.   Family History:  The patient's family history includes Clotting disorder in her father; Diabetes in her brother and sister; Heart Problems in her sister; Heart attack in her mother.    ROS:  Please see the history of present illness.   Otherwise, review of systems are positive for anxiety.   All other systems are reviewed and negative.    PHYSICAL EXAM: VS:  BP (!) 144/76   Pulse 76   Ht '5\' 3"'$  (1.6 m)   Wt 157 lb 3.2 oz (71.3 kg)   SpO2 98%   BMI 27.85 kg/m  , BMI Body mass index is 27.85 kg/m. GEN: Well nourished, well developed, in no acute distress HEENT: normal Neck: no JVD, carotid bruits, or masses Cardiac: RRR; no murmurs, rubs, or gallops,no edema  Respiratory:  clear to auscultation bilaterally, normal work of breathing GI: soft, nontender, nondistended, + BS MS: no deformity or atrophy Skin: warm and dry, no rash Neuro:  Strength and sensation are intact Psych: euthymic mood, full affect    Recent Labs: 12/13/2021: ALT 12; BUN 11; Creatinine, Ser 0.66; Hemoglobin 14.5; Platelets 268; Potassium 4.6; Sodium 142   Lipid Panel    Component Value Date/Time   CHOL 138 12/13/2021 1107   TRIG 202 (H) 12/13/2021 1107   HDL 38 (L) 12/13/2021 1107   CHOLHDL 3.6 12/13/2021 1107    CHOLHDL 4.9 04/26/2021 0338   VLDL 30 04/26/2021 0338   LDLCALC 66 12/13/2021 1107     Other studies Reviewed: Additional studies/ records that were reviewed today with results demonstrating: labs from [facility] reviewed.   ASSESSMENT AND PLAN:  CAD/old MI: Rare angina.  COntrolled with medicines.  No bleeding problems.  Hemoglobin 14.8.  Continue medical therapy.  She has done very well with her medical management of surgical anatomy of coronary artery disease. Hypertension: Home readings are in the 29-528 systolic. HR in the 50s.  Blood pressure improved on recheck today.  Given home blood pressure readings, would not increase doses of medicines. Hyperlipidemia: December 2023 LDL 48 total cholesterol 110 triglycerides 103 HDL 42. Morbid  obesity: Has lost significant weight.  Continue healthy diet. Anxiety: Stable.   Aortic atherosclerosis/AAA: 3.5 cm x 2023 study.  Repeat study in 2024.  Unchanged from 2022.   Current medicines are reviewed at length with the patient today.  The patient concerns regarding her medicines were addressed.  The following changes have been made:  No change  Labs/ tests ordered today include:  No orders of the defined types were placed in this encounter.   Recommend 150 minutes/week of aerobic exercise Low fat, low carb, high fiber diet recommended  Disposition:   FU in 1 year   Signed, Larae Grooms, MD  07/02/2022 8:56 AM    [facility] [address], [place], [place]  [address] Phone: [phone]; Fax: [phone]

## 2022-07-02 ENCOUNTER — Encounter: Payer: Self-pay | Admitting: Interventional Cardiology

## 2022-07-02 ENCOUNTER — Ambulatory Visit: Payer: Medicare Other | Attending: Interventional Cardiology | Admitting: Interventional Cardiology

## 2022-07-02 VITALS — BP 144/76 | HR 76 | Ht 63.0 in | Wt 157.2 lb

## 2022-07-02 DIAGNOSIS — I700 Atherosclerosis of aorta: Secondary | ICD-10-CM | POA: Diagnosis not present

## 2022-07-02 DIAGNOSIS — T466X5A Adverse effect of antihyperlipidemic and antiarteriosclerotic drugs, initial encounter: Secondary | ICD-10-CM

## 2022-07-02 DIAGNOSIS — G720 Drug-induced myopathy: Secondary | ICD-10-CM

## 2022-07-02 DIAGNOSIS — E785 Hyperlipidemia, unspecified: Secondary | ICD-10-CM

## 2022-07-02 DIAGNOSIS — I10 Essential (primary) hypertension: Secondary | ICD-10-CM | POA: Diagnosis not present

## 2022-07-02 DIAGNOSIS — I25708 Atherosclerosis of coronary artery bypass graft(s), unspecified, with other forms of angina pectoris: Secondary | ICD-10-CM | POA: Diagnosis not present

## 2022-07-02 DIAGNOSIS — I714 Abdominal aortic aneurysm, without rupture, unspecified: Secondary | ICD-10-CM

## 2022-07-02 DIAGNOSIS — I252 Old myocardial infarction: Secondary | ICD-10-CM

## 2022-07-02 DIAGNOSIS — T466X5D Adverse effect of antihyperlipidemic and antiarteriosclerotic drugs, subsequent encounter: Secondary | ICD-10-CM

## 2022-07-02 NOTE — Patient Instructions (Signed)
Medication Instructions:  Your physician recommends that you continue on your current medications as directed. Please refer to the Current Medication list given to you today.  *If you need a refill on your cardiac medications before your next appointment, please call your pharmacy*   Lab Work: none If you have labs (blood work) drawn today and your tests are completely normal, you will receive your results only by: [place] (if you have MyChart) OR A paper copy in the mail If you have any lab test that is abnormal or we need to change your treatment, we will call you to review the results.   Testing/Procedures: Your physician has requested that you have an abdominal aorta duplex. During this test, an ultrasound is used to evaluate the aorta. Allow 30 minutes for this exam. Do not eat after midnight the day before and avoid carbonated beverages  To be done in August 2024   Follow-Up: At [facility], you and your health needs are our priority.  As part of our continuing mission to provide you with exceptional heart care, we have created designated Provider Care Teams.  These Care Teams include your primary Cardiologist (physician) and Advanced Practice Providers (APPs -  Physician Assistants and Nurse Practitioners) who all work together to provide you with the care you need, when you need it.  We recommend signing up for the patient portal called "MyChart".  Sign up information is provided on this After Visit Summary.  MyChart is used to connect with patients for Virtual Visits (Telemedicine).  Patients are able to view lab/test results, encounter notes, upcoming appointments, etc.  Non-urgent messages can be sent to your provider as well.   To learn more about what you can do with MyChart, go to [email or web address].    Your next appointment:   12 month(s)  Provider:   Larae Grooms, MD     Other Instructions

## 2022-07-26 ENCOUNTER — Telehealth: Payer: Self-pay | Admitting: Interventional Cardiology

## 2022-07-26 MED ORDER — METOPROLOL TARTRATE 25 MG PO TABS: 25.0000 mg | ORAL_TABLET | Freq: Two times a day (BID) | ORAL | 3 refills | Status: DC

## 2022-07-26 MED ORDER — ISOSORBIDE MONONITRATE ER 30 MG PO TB24: 30.0000 mg | ORAL_TABLET | Freq: Two times a day (BID) | ORAL | 3 refills | Status: DC

## 2022-07-26 NOTE — Addendum Note (Signed)
Addended by: Carter Kitten D on: 07/26/2022 12:51 PM   Modules accepted: Orders

## 2022-07-26 NOTE — Telephone Encounter (Signed)
Pt's medication was sent to pt's pharmacy as requested. Confirmation received.  °

## 2022-07-26 NOTE — Telephone Encounter (Signed)
*  STAT* If patient is at the pharmacy, call can be transferred to refill team.   1. Which medications need to be refilled? (please list name of each medication and dose if known) isosorbide mononitrate (IMDUR) 30 MG 24 hr tablet   2. Which pharmacy/location (including street and city if local pharmacy) is medication to be sent to?  [place], [place] Phone: [phone]  Fax: [phone]      3. Do they need a 30 day or 90 day supply? [place]

## 2022-08-20 ENCOUNTER — Ambulatory Visit (HOSPITAL_BASED_OUTPATIENT_CLINIC_OR_DEPARTMENT_OTHER): Payer: Medicare Other | Admitting: Pulmonary Disease

## 2022-08-20 ENCOUNTER — Encounter (HOSPITAL_BASED_OUTPATIENT_CLINIC_OR_DEPARTMENT_OTHER): Payer: Self-pay | Admitting: Pulmonary Disease

## 2022-08-20 VITALS — BP 122/68 | HR 65 | Wt 157.0 lb

## 2022-08-20 DIAGNOSIS — G4733 Obstructive sleep apnea (adult) (pediatric): Secondary | ICD-10-CM | POA: Diagnosis not present

## 2022-08-20 NOTE — Progress Notes (Signed)
[facility]  Chief Complaint  Patient presents with   Follow-up    Pt states the plug that goes into the machine keeps popping out of her cpap machine. She states it connects to another little box.     Past Surgical History:  She  has a past surgical history that includes Replacement total knee; Cystoscopy; and LEFT HEART CATH AND CORONARY ANGIOGRAPHY (N/A, 04/24/2021).  Past Medical History:  Hypothyroidism, Allergies, Aortic aneurysm, Nephrolithiasis, CAD, Cystocele, Hypertension, HLD, Osteopenia, Pre DM, SVT, Vit D deficiency  Constitutional:  BP 122/68 (BP Location: Left Arm, Patient Position: Sitting, Cuff Size: Normal)   Pulse 65   Wt 157 lb (71.2 kg)   SpO2 97%   BMI 27.81 kg/m   Brief Summary:  Shannon Walker is a 89 y.o. female with obstructive sleep apnea.      Subjective:   She is here with her daughter.  Uses CPAP nightly.  Mask is comfortable.  She has a piece that popped off.  It doesn't fit right anymore and she has to use tape to keep it connected.   Physical Exam:   Appearance - well kempt   ENMT - no sinus tenderness, no oral exudate, no LAN, Mallampati 3 airway, no stridor, wears dentures  Respiratory - equal breath sounds bilaterally, no wheezing or rales  CV - s1s2 regular rate and rhythm, no murmurs  Ext - no clubbing, no edema  Skin - no rashes  Psych - normal mood and affect     Chest Imaging:  CT angio chest 04/23/21 >> atherosclerosis, elevated Rt diaphragm, ATX, small HH  Sleep Tests:  HST 09/25/21>> AHI 22, SpO2 low 75% Auto CPAP 07/18/22 to 08/16/22 >> used on 30 of 30 nights with average 2 hrs 49 min.  Average AHI 17.1 with median CPAP 7 and 95 th percentile CPAP 10 cm H2O.  Air leak.  Cardiac Tests:  Echo 04/24/21 >> EF 65 to 70%, mild LVH  Social History:  She  reports that she has never smoked. She has been exposed to tobacco smoke. She has never used smokeless tobacco. She  reports that she does not drink alcohol and does not use drugs.  Family History:  Her family history includes Clotting disorder in her father; Diabetes in her brother and sister; Heart Problems in her sister; Heart attack in her mother.     Assessment/Plan:   Obstructive sleep apnea. - she is compliant with CPAP and reports benefit from therapy - uses [employer] for her DME - current CPAP ordered May 2023 - continue [place] S/M hybrid mask - will change auto CPAP to 5 -15 cm H2O - will have her machine checked and see if she needs a replacement machine  Coronary artery disease. - she is followed by Dr. Casandra Doffing with cardiology  Time Spent Involved in Patient Care on Day of Examination:  15 minutes  Follow up:   Patient Instructions  Follow up in 1 year  Medication List:   Allergies as of 08/20/2022       Reactions   Bee Venom Anaphylaxis   Praluent [alirocumab] Shortness Of Breath   Atorvastatin    Myalgias on atorvastatin 80mg  daily   Celecoxib Other (See Comments)   Abdominal pain   Iodine Hives   Repatha [evolocumab]    Muscle aches   Rosuvastatin    Myalgias on rosuvastatin 10mg  and 20mg  daily and 20mg  once weekly   Sulfamethoxazole-trimethoprim Other (  See Comments)   Restless legs, involuntary movements   Codeine Palpitations   Iodinated Contrast Media Rash   Nabumetone Other (See Comments), Palpitations   Tachycardia        Medication List        Accurate as of August 20, 2022  9:51 AM. If you have any questions, ask your nurse or doctor.          ALPRAZolam 0.5 MG tablet Commonly known as: XANAX Take 0.25 mg by mouth every 8 (eight) hours as needed for anxiety.   aspirin EC 81 MG tablet Take 1 tablet by mouth daily.   clopidogrel 75 MG tablet Commonly known as: PLAVIX Take 1 tablet by mouth once daily   cyanocobalamin 100 MCG tablet Take 100 mcg by mouth daily.   isosorbide mononitrate 30 MG 24 hr tablet Commonly known as:  IMDUR Take 1 tablet (30 mg total) by mouth 2 (two) times daily.   levothyroxine 50 MCG tablet Commonly known as: SYNTHROID Take 50 mcg by mouth daily before breakfast.   Magnesium Gluconate 550 MG Tabs Take 500 mg by mouth daily.   metoprolol tartrate 25 MG tablet Commonly known as: LOPRESSOR Take 1 tablet (25 mg total) by mouth 2 (two) times daily.   nitroGLYCERIN 0.4 MG SL tablet Commonly known as: NITROSTAT DISSOLVE ONE TABLET UNDER THE TONGUE EVERY 5 MINUTES AS NEEDED FOR CHEST PAIN.  DO NOT EXCEED A TOTAL OF 3 DOSES IN 15 MINUTES   rosuvastatin 5 MG tablet Commonly known as: CRESTOR Take 1 tablet (5 mg total) by mouth 3 (three) times a week.   Vitamin D (Cholecalciferol) 25 MCG (1000 UT) Caps Take 2,000 Units by mouth daily.        Signature:  Chesley Mires, MD [place] Pager - [phone] 08/20/2022, 9:51 AM

## 2022-08-20 NOTE — Patient Instructions (Signed)
Follow up in 1 year.

## 2022-09-28 IMAGING — CT CT ANGIO CHEST
2 of 7 series · 17 of 46 positions shown · IV contrast (APPLIED)
Comparison: 04/23/2021, 08/23/2014.

CLINICAL DATA: Positive D-dimer, PE suspected, low/intermediate
probability.

EXAM:
CT ANGIOGRAPHY CHEST WITH CONTRAST
TECHNIQUE: Multidetector CT imaging of the chest was performed using the
standard protocol during bolus administration of intravenous
contrast. Multiplanar CT image reconstructions and MIPs were
obtained to evaluate the vascular anatomy.
CONTRAST:  72mL OMNIPAQUE IOHEXOL 350 MG/ML SOLN

[Series 6: thins · axial · 0.88mm/px · z∈[+1009,+1228]mm · 14 of 353 slices shown]
[im 20/353  lung]
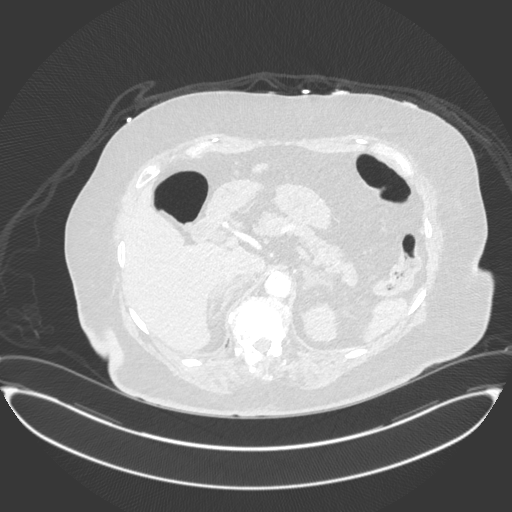
[im 40/353  soft-tissue]
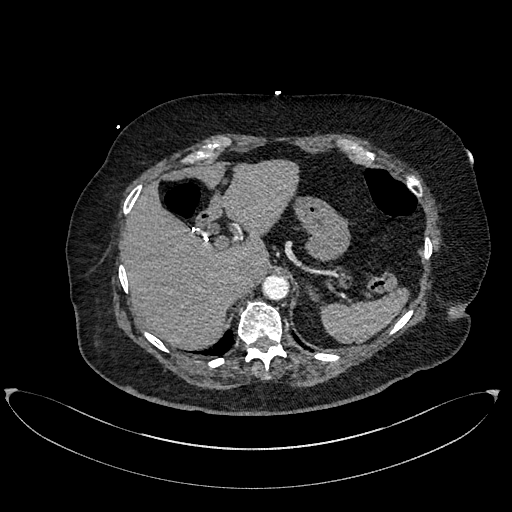
[im 79/353  lung]
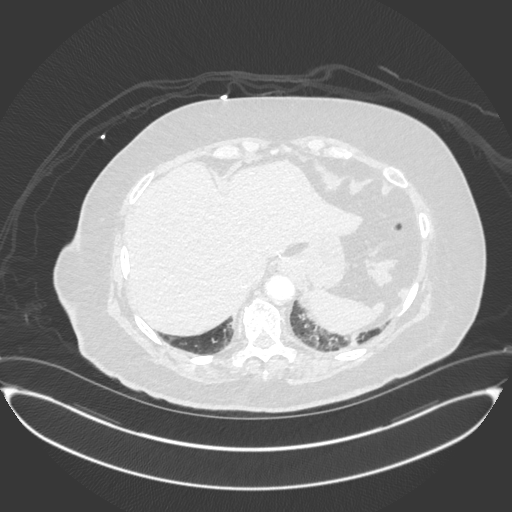
[im 98/353  soft-tissue]
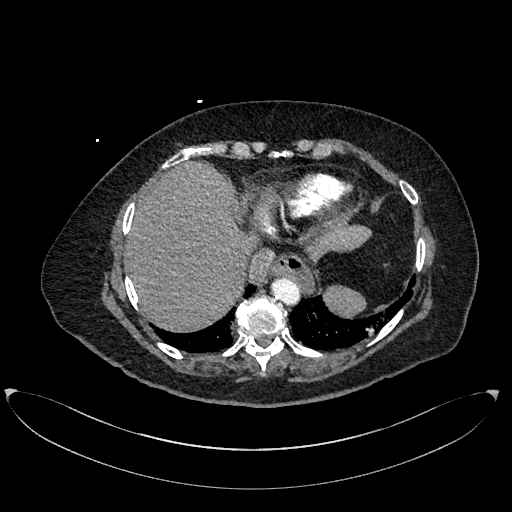
[im 118/353  lung]
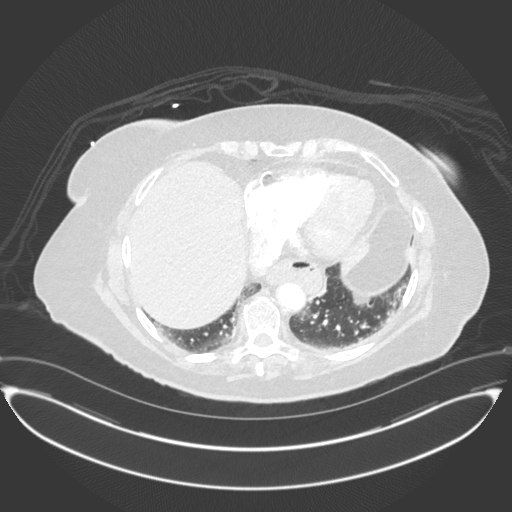
[im 137/353  soft-tissue]
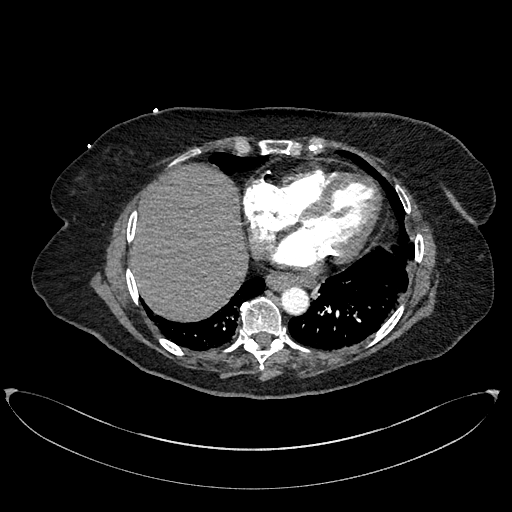
[im 157/353  lung]
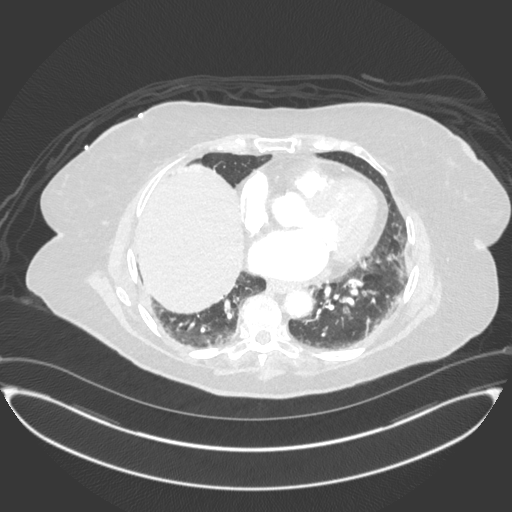
[im 196/353  soft-tissue]
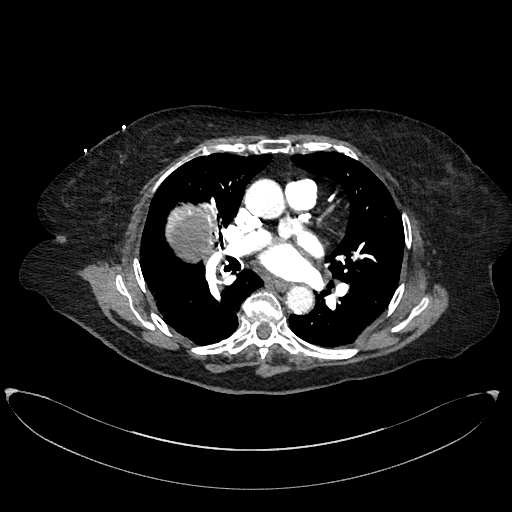
[im 216/353  lung]
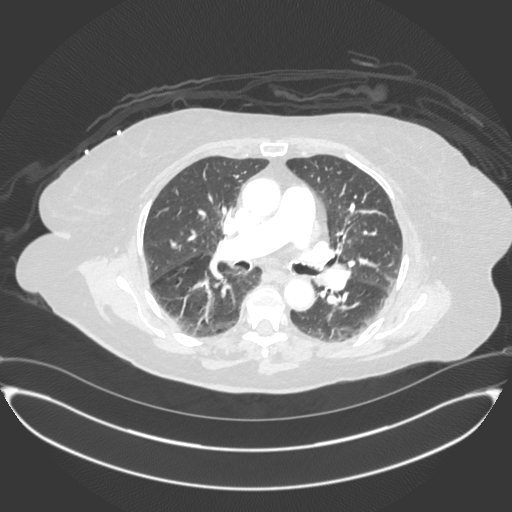
[im 235/353  soft-tissue]
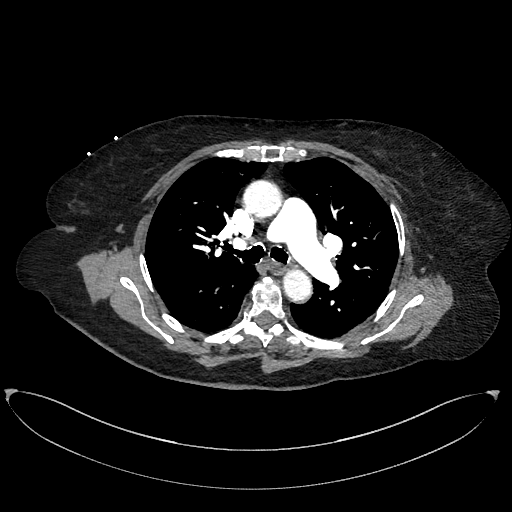
[im 255/353  lung]
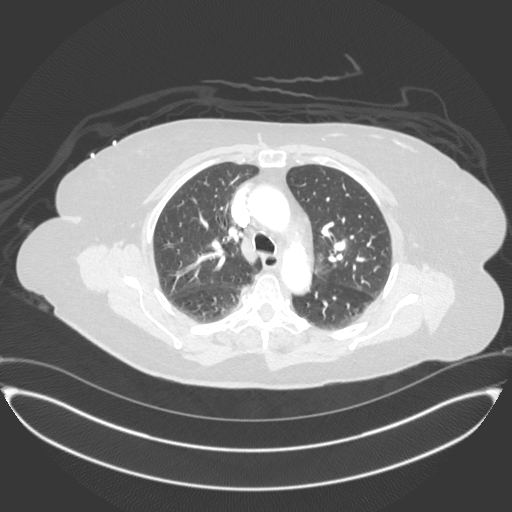
[im 274/353  soft-tissue]
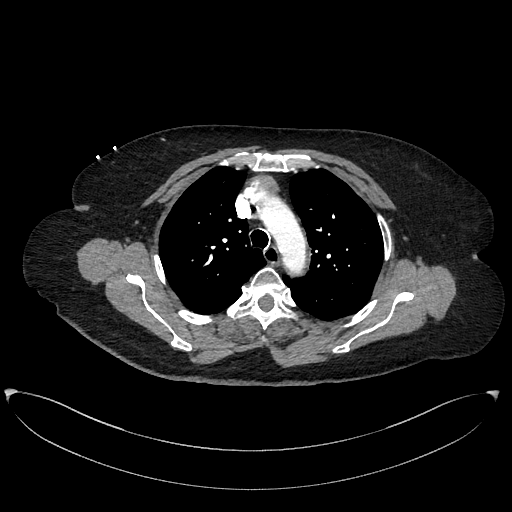
[im 313/353  lung]
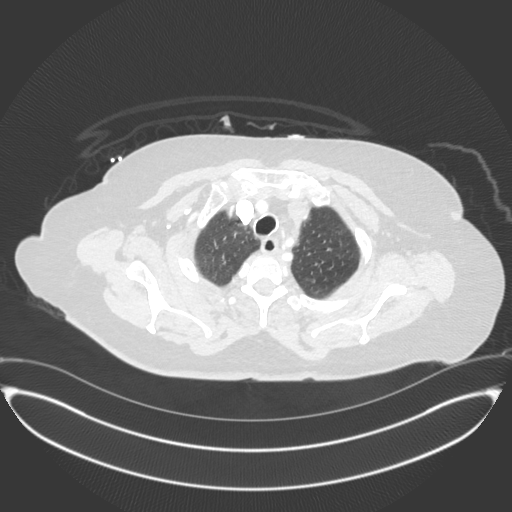
[im 333/353  soft-tissue]
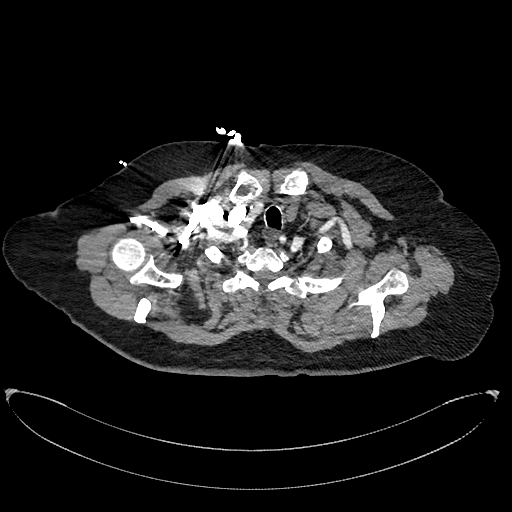

[Series 8: cor · coronal · 0.49mm/px · 3 of 133 slices shown]
[im 34/133  soft-tissue]
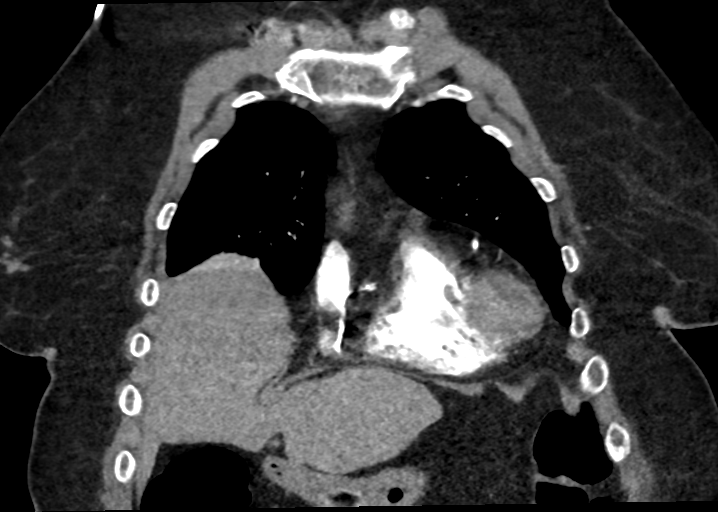
[im 67/133  soft-tissue]
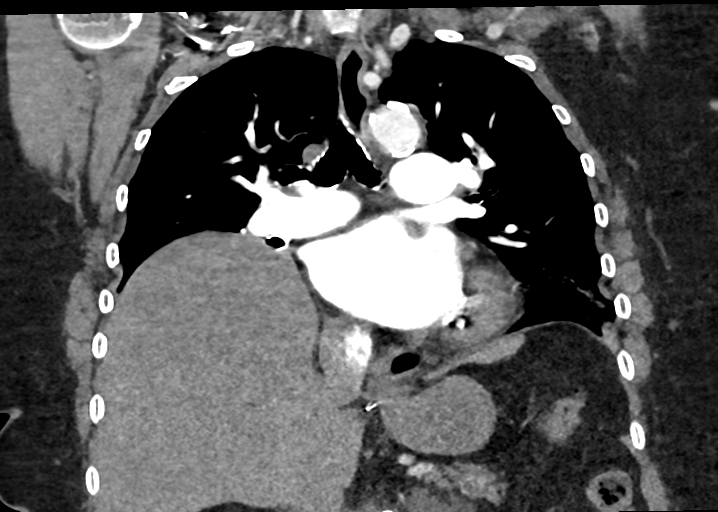
[im 100/133  soft-tissue]
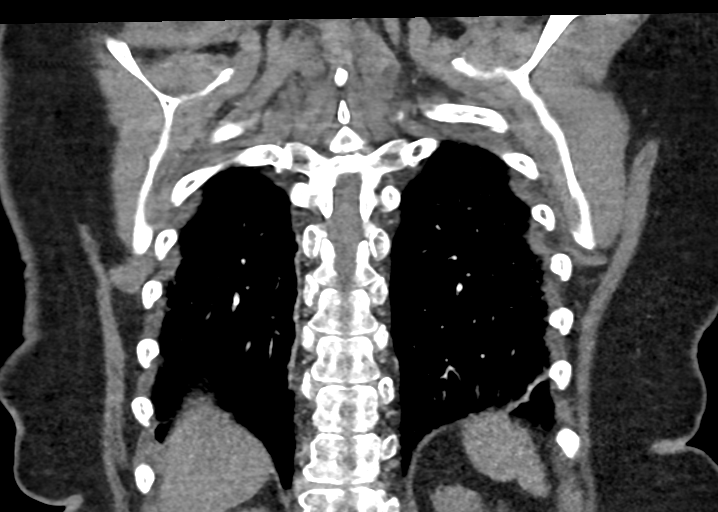

[17 of 46 positions shown; findings below may reference images not displayed]

FINDINGS: Cardiovascular: The heart is enlarged there is no pericardial
effusion. Coronary artery calcifications are noted. There is
calcification of the mitral valve. Atherosclerotic calcification of
the aorta without evidence of aneurysm. The pulmonary trunk is
distended which may be associated with underlying pulmonary artery
hypertension. No pulmonary artery filling defect is identified.
Evaluation of the pulmonary arteries at the right lung base is
limited due to compressive atelectasis.

Mediastinum/Nodes: No enlarged mediastinal, hilar, or axillary lymph
nodes. Thyroid gland, trachea, and esophagus demonstrate no
significant findings.

Lungs/Pleura: There is elevation of the right diaphragm. Atelectasis
is present at the lung bases bilaterally. No effusion or
pneumothorax.

Upper Abdomen: Small hiatal hernia. The gallbladder is surgically
absent. There is a calcification in the left kidney measuring 4 mm.
A hyperdense lesion is seen along the posterior aspect of the left
kidney measuring 8 mm, possible proteinaceous or hemorrhagic cyst.
Scattered diverticula are noted along the colon.

Musculoskeletal: There is compression deformity at T6 which is
unchanged from 9228.

Review of the MIP images confirms the above findings.
IMPRESSION: 1. No evidence of pulmonary embolism or other acute process.
2. Elevation of the right diaphragm with atelectasis at the lung
bases bilaterally.
3. Cardiomegaly with multi-vessel coronary artery calcifications.
4. Mildly distended pulmonary trunk which may be associated with
underlying pulmonary artery hypertension.
5. Remaining findings as described above.

## 2023-01-02 ENCOUNTER — Telehealth: Payer: Self-pay | Admitting: Interventional Cardiology

## 2023-01-02 DIAGNOSIS — I714 Abdominal aortic aneurysm, without rupture, unspecified: Secondary | ICD-10-CM

## 2023-01-02 NOTE — Telephone Encounter (Signed)
New order placed for AAA duplex

## 2023-01-02 NOTE — Telephone Encounter (Signed)
Pt's daughter calling to reschedule the appointment for tomorrow 8/8 due to weather and the order needs to be put back in because the order expires on 8/10. Please advise

## 2023-01-03 ENCOUNTER — Ambulatory Visit (HOSPITAL_COMMUNITY): Payer: Medicare Other

## 2023-01-08 ENCOUNTER — Ambulatory Visit (HOSPITAL_COMMUNITY)
Admission: RE | Admit: 2023-01-08 | Discharge: 2023-01-08 | Disposition: A | Discharge status: Home / Self Care | Payer: Medicare Other | Source: Ambulatory Visit | Attending: Cardiovascular Disease | Admitting: Cardiovascular Disease

## 2023-01-08 DIAGNOSIS — I714 Abdominal aortic aneurysm, without rupture, unspecified: Secondary | ICD-10-CM | POA: Diagnosis present

## 2023-01-08 DIAGNOSIS — I7143 Infrarenal abdominal aortic aneurysm, without rupture: Secondary | ICD-10-CM | POA: Diagnosis present

## 2023-01-10 ENCOUNTER — Telehealth: Payer: Self-pay | Admitting: Interventional Cardiology

## 2023-01-10 DIAGNOSIS — I714 Abdominal aortic aneurysm, without rupture, unspecified: Secondary | ICD-10-CM

## 2023-01-10 NOTE — Telephone Encounter (Signed)
-----   Message from [place] sent at 01/09/2023  8:47 PM EDT ----- Mild increase in size of AAA. Can repeat study in 12 months.

## 2023-01-10 NOTE — Telephone Encounter (Signed)
Patient is returning phone call in regards to AAA Duplex results.

## 2023-01-10 NOTE — Telephone Encounter (Signed)
Results reviewed with patient's daughter.

## 2023-02-25 ENCOUNTER — Other Ambulatory Visit: Payer: Self-pay | Admitting: Specialist

## 2023-02-25 DIAGNOSIS — Z1231 Encounter for screening mammogram for malignant neoplasm of breast: Secondary | ICD-10-CM

## 2023-04-08 ENCOUNTER — Ambulatory Visit
Admission: RE | Admit: 2023-04-08 | Discharge: 2023-04-08 | Disposition: A | Discharge status: Home / Self Care | Payer: Medicare Other | Source: Ambulatory Visit | Attending: Specialist | Admitting: Specialist

## 2023-04-08 DIAGNOSIS — Z1231 Encounter for screening mammogram for malignant neoplasm of breast: Secondary | ICD-10-CM

## 2023-05-03 ENCOUNTER — Other Ambulatory Visit: Payer: Self-pay | Admitting: Interventional Cardiology

## 2023-06-23 ENCOUNTER — Other Ambulatory Visit: Payer: Self-pay | Admitting: Interventional Cardiology

## 2023-07-02 NOTE — Progress Notes (Signed)
 Cardiology Office Note:  .   Date:  07/04/2023  ID:  Shannon Walker, DOB [birth year], MRN [id number] PCP: Shannon Eleanor Rung, NP  [facility] Providers Cardiologist:  Dr. Redell Shallow  }   History of Present Illness: .   Shannon Walker is a 89 y.o. female history of CAD, anemia, AAA with repeat study due August 2025, hypertension, bilateral carotid artery disease, SVT, chronic angina, OSA, hypothyroidism.    Recent LHC in 2022, showed severe multivessel CAD including moderate to severe distal left main disease. She is not a good PCI or CABG candidate so is being treated medically   She comes today with her daughter, Shannon Walker who is her healthcare power of attorney.  The patient also requests change in advanced directives she was previously a DNR but now wants to be a full code.  She states that she has been tired more and not as active.  Her energy level is low and she is not active outside in her yard as she used to be due to the cold weather.  She enjoys being outside working with her flowers but with the cold weather she has not been able to be outside as much and this is causing her to be more tired.  She denies any significant chest pain shortness of breath dizziness, any bleeding on clopidogrel , she has had recent labs completed by PCP for lipid panel.  Lipid panel dated 06/21/2023 total cholesterol 149, triglycerides 92, HDL 42, LDL 88.     ROS: As above otherwise negative  Studies Reviewed: SABRA   EKG Interpretation Date/Time:  Thursday July 04 2023 08:04:29 EST Ventricular Rate:  60 PR Interval:  140 QRS Duration:  86 QT Interval:  452 QTC Calculation: 452 R Axis:   -9  Text Interpretation: Normal sinus rhythm Minimal voltage criteria for LVH, may be normal variant ( Cornell product ) Inferior infarct , age undetermined T wave abnormality, consider anterior ischemia When compared with ECG of 30-Apr-2021 07:07, T wave inversion now evident in Anterior leads  Confirmed by Jerilynn Collar [phone]) on 07/04/2023 8:24:41 AM    LHC 04/02/2021  Mid RCA lesion is 99% stenosed.   Prox RCA lesion is 40% stenosed.   Ost LM to Dist LM lesion is 60% stenosed.   Dist LM to Prox LAD lesion is 60% stenosed.   Mid LAD lesion is 99% stenosed.   1st Diag lesion is 60% stenosed.   Moderate to severe distal left main stenosis Moderate, heavily calcified ostial LAD stenosis. Severe heavily calcified mid LAD stenosis just past the takeoff of the diagonal branch. The diagonal branch has diffuse moderate stenosis. Mild non-obstructive disease in the Circumflex.  The RCA is a large dominant vessel with heavy calcification in the proximal and mid vessel. The mid RCA has a severe, heavily calcified stenosis that is a functional CTO. The distal RCA branches fill from antegrade flow and from left to right collaterals.  LVEDP 7 mmHg  Per CVTS revascularization would be high risk and therefore she is managed medically and symptomatically.  When last seen by Dr. Dann,  on 05/24/2021 the patient was started on rosuvastatin  20 mg weekly and is now on Repatha    Physical Exam:   VS:  BP 126/70 (BP Location: Right Arm, Patient Position: Sitting, Cuff Size: Normal)   Pulse 60   Ht 5' 2 (1.575 m)   Wt 166 lb (75.3 kg)   SpO2 98%   BMI 30.36 kg/m  Wt Readings from Last 3 Encounters:  07/04/23 166 lb (75.3 kg)  08/20/22 157 lb (71.2 kg)  07/02/22 157 lb 3.2 oz (71.3 kg)    GEN: Well nourished, well developed in no acute distress NECK: No JVD; No carotid bruits CARDIAC: RRR, bradycardic with 2/6 systolic murmur, no, rubs, gallops RESPIRATORY:  Clear to auscultation without rales, wheezing or rhonchi  ABDOMEN: Soft, non-tender, non-distended EXTREMITIES:  No edema; No deformity   ASSESSMENT AND PLAN: .    Coronary artery disease: Multivessel CAD including moderate to severe distal left main disease was noted on cardiac catheterization 2022.  She was not found to be a  good candidate for PCI or CABG and is being treated medically with isosorbide  mononitrate, clopidogrel , and low-cholesterol diet.  She is not as active as she likes to be during the winter months as she feels tired and listless.  I am going to decrease her metoprolol  to 12.5 mg daily versus twice daily to help with her heart rate as she states it normally runs in the 50s.  This may help her feel less tired.  If she finds that she is not feeling any better and has having palpitations or chest pain she is to go up on the metoprolol  back to the full 25 mg tablet.  2.  Hypercholesterolemia: Goal of LDL is less than 70.  She is no longer on rosuvastatin  and she is unable to take it due to myalgia pain.  I am going to start her on Zetia  10 mg daily.  She did try the PCSK9 inhibition injections and she states that this caused her to have generalized fatigue and weakness and stopped using them as well.  I will repeat her lipid studies in 3 months.  3.  Hypertension: Blood pressure is low normal for her.  With reduction in metoprolol  I have explained to her that her blood pressure may rise 5 to 8 mmHg.  Allowing for purposeful hypertension but I have warned her it that if her blood pressure runs higher than 150/90 she is to report this.         Signed, Lamarr HERO. Jerilynn CHOL, ANP, AACC

## 2023-07-04 ENCOUNTER — Ambulatory Visit: Payer: Medicare Other | Attending: Adult Health | Admitting: Adult Health

## 2023-07-04 ENCOUNTER — Encounter: Payer: Self-pay | Admitting: Adult Health

## 2023-07-04 VITALS — BP 126/70 | HR 60 | Ht 62.0 in | Wt 166.0 lb

## 2023-07-04 DIAGNOSIS — I2510 Atherosclerotic heart disease of native coronary artery without angina pectoris: Secondary | ICD-10-CM

## 2023-07-04 DIAGNOSIS — Z79899 Other long term (current) drug therapy: Secondary | ICD-10-CM | POA: Diagnosis not present

## 2023-07-04 DIAGNOSIS — E7800 Pure hypercholesterolemia, unspecified: Secondary | ICD-10-CM

## 2023-07-04 DIAGNOSIS — R5383 Other fatigue: Secondary | ICD-10-CM

## 2023-07-04 DIAGNOSIS — I10 Essential (primary) hypertension: Secondary | ICD-10-CM | POA: Diagnosis not present

## 2023-07-04 DIAGNOSIS — R5381 Other malaise: Secondary | ICD-10-CM

## 2023-07-04 MED ORDER — METOPROLOL TARTRATE 25 MG PO TABS: 12.5000 mg | ORAL_TABLET | Freq: Every day | ORAL | 0 refills | Status: DC

## 2023-07-04 MED ORDER — EZETIMIBE 10 MG PO TABS: 10.0000 mg | ORAL_TABLET | Freq: Every day | ORAL | 3 refills | Status: DC

## 2023-07-04 NOTE — Addendum Note (Signed)
 Addended by: Danna Duster T on: 07/04/2023 11:49 AM   Modules accepted: Orders

## 2023-07-04 NOTE — Patient Instructions (Signed)
 Medication Instructions:  START ZETIA  10 MG DAILY  DECREASE METOPROLOL  TARTRATE TO 12.5 MG DAILY *If you need a refill on your cardiac medications before your next appointment, please call your pharmacy*   Lab Work: FASTING LIPIDS IN 3 MONTHS If you have labs (blood work) drawn today and your tests are completely normal, you will receive your results only by: MyChart Message (if you have MyChart) OR A paper copy in the mail If you have any lab test that is abnormal or we need to change your treatment, we will call you to review the results.   Testing/Procedures: NO TESTING   Follow-Up: At [facility], you and your health needs are our priority.  As part of our continuing mission to provide you with exceptional heart care, we have created designated Provider Care Teams.  These Care Teams include your primary Cardiologist (physician) and Advanced Practice Providers (APPs -  Physician Assistants and Nurse Practitioners) who all work together to provide you with the care you need, when you need it.  We recommend signing up for the patient portal called MyChart.  Sign up information is provided on this After Visit Summary.  MyChart is used to connect with patients for Virtual Visits (Telemedicine).  Patients are able to view lab/test results, encounter notes, upcoming appointments, etc.  Non-urgent messages can be sent to your provider as well.   To learn more about what you can do with MyChart, go to [email or web address].    Your next appointment:   3 month(s)  Provider:   Redell Shallow, MD or Lamarr Satterfield DNP, ANP   Other Instructions

## 2023-07-24 ENCOUNTER — Other Ambulatory Visit: Payer: Self-pay | Admitting: Interventional Cardiology

## 2023-07-27 ENCOUNTER — Other Ambulatory Visit: Payer: Self-pay | Admitting: Interventional Cardiology

## 2023-07-29 ENCOUNTER — Ambulatory Visit: Payer: Medicare Other | Admitting: Cardiology

## 2023-07-31 DIAGNOSIS — E785 Hyperlipidemia, unspecified: Secondary | ICD-10-CM

## 2023-07-31 DIAGNOSIS — I214 Non-ST elevation (NSTEMI) myocardial infarction: Secondary | ICD-10-CM | POA: Diagnosis not present

## 2023-07-31 DIAGNOSIS — I2510 Atherosclerotic heart disease of native coronary artery without angina pectoris: Secondary | ICD-10-CM | POA: Diagnosis not present

## 2023-07-31 DIAGNOSIS — I249 Acute ischemic heart disease, unspecified: Secondary | ICD-10-CM | POA: Diagnosis not present

## 2023-07-31 DIAGNOSIS — R778 Other specified abnormalities of plasma proteins: Secondary | ICD-10-CM

## 2023-07-31 DIAGNOSIS — R040 Epistaxis: Secondary | ICD-10-CM | POA: Diagnosis not present

## 2023-07-31 DIAGNOSIS — I10 Essential (primary) hypertension: Secondary | ICD-10-CM

## 2023-07-31 DIAGNOSIS — I340 Nonrheumatic mitral (valve) insufficiency: Secondary | ICD-10-CM | POA: Diagnosis not present

## 2023-08-01 DIAGNOSIS — R000 Tachycardia, unspecified: Secondary | ICD-10-CM | POA: Diagnosis not present

## 2023-08-01 DIAGNOSIS — R040 Epistaxis: Secondary | ICD-10-CM | POA: Diagnosis not present

## 2023-08-01 DIAGNOSIS — I2510 Atherosclerotic heart disease of native coronary artery without angina pectoris: Secondary | ICD-10-CM | POA: Diagnosis not present

## 2023-08-01 DIAGNOSIS — I214 Non-ST elevation (NSTEMI) myocardial infarction: Secondary | ICD-10-CM | POA: Diagnosis not present

## 2023-08-01 DIAGNOSIS — I249 Acute ischemic heart disease, unspecified: Secondary | ICD-10-CM | POA: Diagnosis not present

## 2023-08-02 DIAGNOSIS — E785 Hyperlipidemia, unspecified: Secondary | ICD-10-CM | POA: Diagnosis not present

## 2023-08-02 DIAGNOSIS — I249 Acute ischemic heart disease, unspecified: Secondary | ICD-10-CM | POA: Diagnosis not present

## 2023-08-02 DIAGNOSIS — I160 Hypertensive urgency: Secondary | ICD-10-CM | POA: Diagnosis not present

## 2023-08-03 DIAGNOSIS — I160 Hypertensive urgency: Secondary | ICD-10-CM | POA: Diagnosis not present

## 2023-08-05 ENCOUNTER — Other Ambulatory Visit: Payer: Self-pay | Admitting: Nurse Practitioner

## 2023-08-14 ENCOUNTER — Telehealth: Payer: Self-pay | Admitting: Adult Health

## 2023-08-14 NOTE — Telephone Encounter (Signed)
 Spoke with Shannon Walker, Aware of dr Ludwig Clarks recommendations.

## 2023-08-14 NOTE — Telephone Encounter (Signed)
 Pt c/o medication issue:  1. Name of Medication: nitroGLYCERIN (NITROSTAT) 0.4 MG SL tablet  metoprolol tartrate (LOPRESSOR) 25 MG tablet  2. How are you currently taking this medication (dosage and times per day)? As Written   3. Are you having a reaction (difficulty breathing--STAT)? No  4. What is your medication issue? The pt said that she was told she could take up to 10 nitroGLYCERIN daily. The pt has also been taking Metoprolol 2x daily. The home health aid would like clear instructions. Please advise

## 2023-08-14 NOTE — Telephone Encounter (Addendum)
 Patient identification verified by 2 forms. Marilynn Rail, RN    Called and spoke to [employer] states:   -would like to clarify instructions for medications   -Patient is currently taking Metoprolol Tartrate 25mg  BID   -patient was recently discharged from [facility] with HTN  -Recent BP 140/80 HR: 88 -Recent BP 140/90 HR 67 -family and patient were told patient could take up to 10NTG in a day  Informed Michelle:   -Per chart review instructions for NTG are 3tabs in , present to ED if CP does not resolve   -Message sent to Dr. Jens Som regarding Metoprolol and BP   -per chart review Metoprolol was decreased to 12.5mg  daily due to Hr in the 50s  Marcelino Duster has no further questions at this time

## 2023-08-21 ENCOUNTER — Other Ambulatory Visit: Payer: Self-pay | Admitting: Interventional Cardiology

## 2023-09-29 NOTE — Progress Notes (Unsigned)
 Cardiology Office Note    Date:  10/01/2023  ID:  Shannon Walker, Shannon Walker Nov 28, 1932, MRN [id number] PCP:  Despina Floro, NP  Cardiologist:  None  Electrophysiologist:  None   Chief Complaint: Follow up for CAD   History of Present Illness: .    Shannon Walker is a 89 y.o. female with visit-pertinent history of CAD s/p NSTEMI in 03/2021, hypertension, hyperlipidemia, obesity, bilateral carotid artery stenosis, AAA with 3.9 cm aneurysm on ultrasound in 12/2021.  In 03/2021 she was admitted with an NSTEMI.  LHC revealed three-vessel disease including severe distal left main disease.  Extensive discussion was held with patient by cardiology/cardiothoracic surgery and patent did not want to pursue high risk CABG, was not felt to be a candidate for PCI.  Echo during admission revealed LVEF of 65 to 70% with normal wall motion and mild LVH.  Patient returned to the hospital again the day after discharge, troponins were decreasing and she was again discharged.  Patient was previously referred to Pharm.D. and started on Repatha , she had reaction with switch to Praluent  was also unable to tolerate she has been unable to tolerate statins due to myalgias.  Per prior notes patient was admitted to [facility] for an MI from 12/20- 05/18/22.  Patient reported elevations in troponins, she reported no intervention was done.  She was started on Ranexa for angina but noted burning in her bladder a time she took it and has stopped it.  Patient was last seen in clinic on 07/04/2023.  Patient reported that she was more tired and not as active, she noted her energy level was low.  Given increase in fatigue her metoprolol  was decreased to 12.5 mg daily, patient was also started on Zetia  as she was no longer tolerating statins.  On chart review patient was admitted to [facility] in 07/2023.  Unfortunately we do not have full records of this.  An echocardiogram was sent that indicated LVEF of 45 to  50%, inferior/posterior and septal wall appeared mildly hypokinetic, RV size and function was normal, left atrium was mildly dilated, right atrium was normal in size, mild to moderate mitral regurgitation, mitral valve leaflets were mildly thickened, mild aortic sclerosis without evidence of stenosis.   On chart review her PCP notes that patient was admitted to [facility] from March 4 through August 03, 2023.  Apparently patient started having a nosebleed at home that was spontaneous, her daughter was unable to stop the bleeding and ultimately called 911.  Patient reported at home she started having left upper quadrant lower retrosternal pain described as a pressure was somewhat similar to her previous anginal type pain and then developed epistasis.  In the emergency department her blood pressure was initially 213 systolic, treated with IV hydralazine , ENT saw the patient's, treated with Oxy metolazone nasal spray and TXA nasal spray and packed.  Cardiology saw patient in hospital, recommended heparin  no bolus to continue on nitrates, ranolazine and beta-blockade.  Per notes they found a incidental small left frontal subacute hemorrhage, neurology was consulted recommended continuing Plavix  but to stop aspirin .  Today she presents for follow-up.  She reports that she has remained stable from a cardiac standpoint.  She notes difficulty at home in the last 2 weeks as her son sadly passed away 2 weeks ago.  Patient denies any shortness of breath, increased lower extremity edema, orthopnea or PND.  She reports that she has been regularly working in the garden the last week  and has not had any chest discomfort, notes previously she would have some on occasion that would quickly resolve with rest. She would prefer to defer any changes to medication regimen today.  ROS: .   Today she denies chest pain, shortness of breath, lower extremity edema, fatigue, palpitations, melena, hematuria, hemoptysis, diaphoresis,  weakness, presyncope, syncope, orthopnea, and PND.  All other systems are reviewed and otherwise negative. Studies Reviewed: Shannon Walker    EKG:  EKG not ordered today, patient deferred.  CV Studies: Cardiac studies reviewed are outlined and summarized above. Otherwise please see EMR for full report. Cardiac Studies & Procedures   ______________________________________________________________________________________________ CARDIAC CATHETERIZATION  CARDIAC CATHETERIZATION 04/24/2021  Conclusion   Mid RCA lesion is 99% stenosed.   Prox RCA lesion is 40% stenosed.   Ost LM to Dist LM lesion is 60% stenosed.   Dist LM to Prox LAD lesion is 60% stenosed.   Mid LAD lesion is 99% stenosed.   1st Diag lesion is 60% stenosed.  Moderate to severe distal left main stenosis Moderate, heavily calcified ostial LAD stenosis. Severe heavily calcified mid LAD stenosis just past the takeoff of the diagonal branch. The diagonal branch has diffuse moderate stenosis. Mild non-obstructive disease in the Circumflex. The RCA is a large dominant vessel with heavy calcification in the proximal and mid vessel. The mid RCA has a severe, heavily calcified stenosis that is a functional CTO. The distal RCA branches fill from antegrade flow and from left to right collaterals. LVEDP 7 mmHg  Recommendations: She has severe distal left main disease and severe, calcific disease in the RCA and LAD. Her disease would be best treated with CABG as I am not sure we would have a favorable result with PCI. She will be a high risk candidate for CABG given her age but overall she seems relatively functional. PCI will not be a good option for revascularization. Will ask CT surgery to see her for CABG. Resume IV heparin  8 hours post sheath pull. Continue ASA, statin and beta blocker.  Findings Coronary Findings Diagnostic  Dominance: Right  Left Main Ost LM to Dist LM lesion is 60% stenosed. The lesion is calcified. Dist LM to Prox LAD  lesion is 60% stenosed. The lesion is calcified.  Left Anterior Descending Mid LAD lesion is 99% stenosed. The lesion is calcified.  First Diagonal Branch Vessel is moderate in size. 1st Diag lesion is 60% stenosed.  Right Coronary Artery Prox RCA lesion is 40% stenosed. The lesion is calcified. Mid RCA lesion is 99% stenosed. The lesion is calcified.  Third Right Posterolateral Branch Collaterals 3rd RPL filled by collaterals from 2nd Sept.  Intervention  No interventions have been documented.     ECHOCARDIOGRAM  ECHOCARDIOGRAM COMPLETE 04/24/2021  Narrative ECHOCARDIOGRAM REPORT    Patient Name:   Shannon Walker Date of Exam: 04/24/2021 Medical Rec #:  [id number]        Height:       62.0 in Accession #:    [id number]       Weight:       207.2 lb Date of Birth:  02-Aug-1932        BSA:          1.941 m Patient Age:    88 years         BP:           131/60 mmHg Patient Gender: F  HR:           92 bpm. Exam Location:  Inpatient  Procedure: 2D Echo, Cardiac Doppler, Color Doppler, Strain Analysis and 3D Echo  Indications:    CAD Native Vessel I25.10  History:        Patient has prior history of Echocardiogram examinations, most recent 03/15/2021. Carotid Disease; Risk Factors:Hypertension and Dyslipidemia. Hypothyroidism.  Sonographer:    Konnie Perry RDCS Referring Phys: [id number] CHRISTOPHER D MCALHANY  IMPRESSIONS   1. Global longitudinal strain is -15.2%. Left ventricular ejection fraction, by estimation, is 65 to 70%. The left ventricle has normal function. The left ventricle has no regional wall motion abnormalities. There is mild left ventricular hypertrophy. Left ventricular diastolic parameters are indeterminate. 2. Right ventricular systolic function is normal. The right ventricular size is normal. 3. Trivial mitral valve regurgitation. Moderate mitral annular calcification. 4. Aortic valve regurgitation is not visualized. Aortic valve  sclerosis is present, with no evidence of aortic valve stenosis. 5. The inferior vena cava is normal in size with greater than 50% respiratory variability, suggesting right atrial pressure of 3 mmHg.  FINDINGS Left Ventricle: Global longitudinal strain is -15.2%. Left ventricular ejection fraction, by estimation, is 65 to 70%. The left ventricle has normal function. The left ventricle has no regional wall motion abnormalities. The left ventricular internal cavity size was normal in size. There is mild left ventricular hypertrophy. Left ventricular diastolic parameters are indeterminate.  Right Ventricle: The right ventricular size is normal. Right vetricular wall thickness was not assessed. Right ventricular systolic function is normal.  Left Atrium: Left atrial size was normal in size.  Right Atrium: Right atrial size was normal in size.  Pericardium: There is no evidence of pericardial effusion.  Mitral Valve: There is mild thickening of the mitral valve leaflet(s). Moderate mitral annular calcification. Trivial mitral valve regurgitation. MV peak gradient, 11.1 mmHg. The mean mitral valve gradient is 4.0 mmHg.  Tricuspid Valve: The tricuspid valve is normal in structure. Tricuspid valve regurgitation is trivial.  Aortic Valve: Aortic valve regurgitation is not visualized. Aortic valve sclerosis is present, with no evidence of aortic valve stenosis.  Pulmonic Valve: The pulmonic valve was normal in structure. Pulmonic valve regurgitation is not visualized.  Aorta: The aortic root and ascending aorta are structurally normal, with no evidence of dilitation.  Venous: The inferior vena cava is normal in size with greater than 50% respiratory variability, suggesting right atrial pressure of 3 mmHg.   LEFT VENTRICLE PLAX 2D LVIDd:         4.30 cm   Diastology LVIDs:         2.77 cm   LV e' medial:    4.38 cm/s LV PW:         1.20 cm   LV E/e' medial:  16.6 LV IVS:        1.30 cm   LV e'  lateral:   6.66 cm/s LVOT diam:     1.90 cm   LV E/e' lateral: 10.9 LV SV:         50 LV SV Index:   26 LVOT Area:     2.84 cm  3D Volume EF: 3D EF:        54 % LV EDV:       76 ml LV ESV:       35 ml LV SV:        41 ml  RIGHT VENTRICLE RV S prime:     11.20 cm/s  LEFT ATRIUM             Index LA diam:        3.50 cm 1.80 cm/m LA Vol (A2C):   21.9 ml 11.28 ml/m LA Vol (A4C):   25.0 ml 12.88 ml/m LA Biplane Vol: 23.7 ml 12.21 ml/m AORTIC VALVE LVOT Vmax:   98.70 cm/s LVOT Vmean:  55.400 cm/s LVOT VTI:    0.176 m  AORTA Ao Root diam: 3.20 cm Ao Asc diam:  3.30 cm  MITRAL VALVE MV Area (PHT): 4.29 cm     SHUNTS MV Area VTI:   2.02 cm     Systemic VTI:  0.18 m MV Peak grad:  11.1 mmHg    Systemic Diam: 1.90 cm MV Mean grad:  4.0 mmHg MV Vmax:       1.66 m/s MV Vmean:      90.4 cm/s MV Decel Time: 177 msec MV E velocity: 72.60 cm/s MV A velocity: 162.00 cm/s MV E/A ratio:  0.45  Ola Berger MD Electronically signed by Ola Berger MD Signature Date/Time: 04/24/2021/6:09:20 PM    Final    MONITORS  CARDIAC EVENT MONITOR 08/20/2017  Narrative Dates:                                    08/20/2017 2 09/02/2017 Indication:                              SVT Ordering;                               Dr. Sandee Crook Interpreting:                           Dr. Sandee Crook  Baseline transmission:             Sinus tachycardia 122 bpm  Atrial arrhythmia:                    One event with atrial premature contractions  Ventricular arrhythmia:            5 events with ventricular premature contractions  Conduction abnormality:          None  Bradycardia:                           None  Symptoms:                             No symptomatic events   Conclusion:                             Atrial and ventricular premature contractions asymptomatic       ______________________________________________________________________________________________       Current  Reported Medications:.    Current Meds  Medication Sig   ALPRAZolam  (XANAX ) 0.5 MG tablet Take 0.25 mg by mouth every 8 (eight) hours as needed for anxiety.   clopidogrel  (PLAVIX ) 75 MG tablet Take 1 tablet (75 mg total) by mouth daily.   cyanocobalamin 100 MCG tablet Take 100 mcg by mouth daily.   ezetimibe  (ZETIA ) 10 MG tablet Take 1 tablet (10 mg total) by  mouth daily.   isosorbide  mononitrate (IMDUR ) 30 MG 24 hr tablet Take 1 tablet by mouth twice daily   levothyroxine  (SYNTHROID ) 50 MCG tablet Take 50 mcg by mouth daily before breakfast.   Magnesium  Gluconate 550 MG TABS Take 500 mg by mouth daily.   metoprolol  tartrate (LOPRESSOR ) 25 MG tablet Take 0.5 tablets (12.5 mg total) by mouth daily. (Patient taking differently: Take 25 mg by mouth 2 (two) times daily.)   nitroGLYCERIN  (NITROSTAT ) 0.4 MG SL tablet DISSOLVE ONE TABLET UNDER THE TONGUE EVERY 5 MINUTES AS NEEDED FOR CHEST PAIN.  DO NOT EXCEED A TOTAL OF 3 DOSES IN 15 MINUTES   Vitamin D, Cholecalciferol, 1000 units CAPS Take 2,000 Units by mouth daily.    [DISCONTINUED] aspirin  EC 81 MG tablet Take 1 tablet by mouth daily.    Physical Exam:    VS:  BP 118/64   Pulse 64   Ht 5\' 2"  (1.575 m)   Wt 165 lb 12.8 oz (75.2 kg)   SpO2 96%   BMI 30.33 kg/m    Wt Readings from Last 3 Encounters:  10/01/23 165 lb 12.8 oz (75.2 kg)  07/04/23 166 lb (75.3 kg)  08/20/22 157 lb (71.2 kg)    GEN: Well nourished, well developed in no acute distress NECK: No JVD; No carotid bruits CARDIAC: RRR, no murmurs, rubs, gallops RESPIRATORY:  Clear to auscultation without rales, wheezing or rhonchi  ABDOMEN: Soft, non-tender, non-distended EXTREMITIES:  No edema; No acute deformity     Asessement and Plan:.    CAD/mildly reduced EF: Patient admitted in 03/2021 with an NSTEMI LHC revealed three-vessel disease including severe distal left main disease.  Patient would be a high risk CABG per cardiothoracic surgery, patient declined.  Not felt to  be a candidate for PCI.  Being medically managed.  Today she reports that she has been stable, she reports the last week she has had no significant chest discomfort, pain or pressure even while out gardening.  She notes that she will on occasion have some chest discomfort that quickly resolves with rest.  Patient's aspirin  was discontinued while at [facility] as recommended by neurology given evidence of a small left frontal subacute hemorrhage, it was recommended that she continue on Plavix  75 mg daily.  Patient prefers to continue with this recommendation. Echocardiogram was sent from [facility] that indicated LVEF of 45 to 50%, inferior/posterior and septal wall appeared mildly hypokinetic, RV size and function was normal, left atrium was mildly dilated, right atrium was normal in size, mild to moderate mitral regurgitation, mitral valve leaflets were mildly thickened, mild aortic sclerosis without evidence of stenosis.  Reviewed echocardiogram results with patient, she will monitor for weight gain, shortness of breath or increased lower extremity edema.  Patient denies any recent changes, notes she would defer any changes recommended today.  Continue Plavix  75 mg daily, Zetia  10 mg daily, Imdur  30 mg twice daily and metoprolol  tartrate 12.5 mg daily.  Reviewed ED precautions.  Hypertension: Blood pressure today 118/64.  Continue Imdur .  Hyperlipidemia: Per patient's PCPs note at [facility] her total cholesterol was 125, triglycerides were 67, HDL 40 and LDL 71. Patient with history of statin intolerance.  Continue Zetia  10 mg daily.  AAA: AAA duplex on 01/08/2023 indicated 4.1 cm dilation of the abdominal aorta, Dr. Jacquelynn Matter recommended one year follow up. Order in place.    Disposition: F/u with Dr. Sandee Crook in 3 months, per patient request to establish at [facility] office.   Signed, Asir Bingley  Vinita Greenspan, NP

## 2023-10-01 ENCOUNTER — Encounter: Payer: Self-pay | Admitting: Cardiology

## 2023-10-01 ENCOUNTER — Ambulatory Visit: Payer: Medicare Other | Admitting: Adult Health

## 2023-10-01 ENCOUNTER — Ambulatory Visit: Attending: Cardiology | Admitting: Cardiology

## 2023-10-01 VITALS — BP 118/64 | HR 64 | Ht 62.0 in | Wt 165.8 lb

## 2023-10-01 DIAGNOSIS — I10 Essential (primary) hypertension: Secondary | ICD-10-CM | POA: Diagnosis not present

## 2023-10-01 DIAGNOSIS — E7800 Pure hypercholesterolemia, unspecified: Secondary | ICD-10-CM

## 2023-10-01 DIAGNOSIS — G720 Drug-induced myopathy: Secondary | ICD-10-CM | POA: Diagnosis not present

## 2023-10-01 DIAGNOSIS — T466X5D Adverse effect of antihyperlipidemic and antiarteriosclerotic drugs, subsequent encounter: Secondary | ICD-10-CM

## 2023-10-01 DIAGNOSIS — I25118 Atherosclerotic heart disease of native coronary artery with other forms of angina pectoris: Secondary | ICD-10-CM

## 2023-10-01 DIAGNOSIS — T466X5A Adverse effect of antihyperlipidemic and antiarteriosclerotic drugs, initial encounter: Secondary | ICD-10-CM

## 2023-10-01 NOTE — Patient Instructions (Signed)
 Medication Instructions:  No changes *If you need a refill on your cardiac medications before your next appointment, please call your pharmacy*  Lab Work: No labs If you have labs (blood work) drawn today and your tests are completely normal, you will receive your results only by: MyChart Message (if you have MyChart) OR A paper copy in the mail If you have any lab test that is abnormal or we need to change your treatment, we will call you to review the results.  Testing/Procedures: No testing  Follow-Up: At [facility], you and your health needs are our priority.  As part of our continuing mission to provide you with exceptional heart care, our providers are all part of one team.  This team includes your primary Cardiologist (physician) and Advanced Practice Providers or APPs (Physician Assistants and Nurse Practitioners) who all work together to provide you with the care you need, when you need it.  Your next appointment:   3-4 month(s)  Provider:   Zoe Hinds, MD  We recommend signing up for the patient portal called "MyChart".  Sign up information is provided on this After Visit Summary.  MyChart is used to connect with patients for Virtual Visits (Telemedicine).  Patients are able to view lab/test results, encounter notes, upcoming appointments, etc.  Non-urgent messages can be sent to your provider as well.   To learn more about what you can do with MyChart, go to [email or web address].

## 2023-11-15 ENCOUNTER — Telehealth: Payer: Self-pay | Admitting: Pulmonary Disease

## 2023-11-15 NOTE — Telephone Encounter (Signed)
 PT not seen in a year. We can not fill req for CPAP supplies until NP sees here at her upcoming appt.

## 2023-12-20 ENCOUNTER — Telehealth: Payer: Self-pay

## 2023-12-20 NOTE — Telephone Encounter (Signed)
 If they're requiring an OV and sleep study, nothing we can do in meantime. She can look up supplies online and pay out of pocket. I have sooner openings at [place] office. Can do virtual. Thanks.

## 2023-12-20 NOTE — Telephone Encounter (Signed)
 Copied from CRM [id number]. Topic: Clinical - Order For Equipment >> Dec 18, 2023 11:41 AM Leila C wrote: Reason for CRM: Patient's child Holley [phone] states called the office in 11/15/23 and patient was overdue for an office visit. Pam states [facility] needs another sleep study visit and office visit for patient to have refill cpap supplies. Due to all the miscommunication, Pam didn't scheduled right away. Patient is scheduled for 01/23/24 at 2 pm with NP, Cobb. Pam asked what can be done for patient since she's out of supplies. Patient keeps waking up and cannot sleep. Placed patient on wait list. Please advise and call back.    Patient / daughter pam  Verbalized understanding    NFN-

## 2024-01-01 DIAGNOSIS — R319 Hematuria, unspecified: Secondary | ICD-10-CM | POA: Insufficient documentation

## 2024-01-01 NOTE — Progress Notes (Unsigned)
 Cardiology Office Note:    Date:  01/02/2024   ID:  BELL CARBO, DOB 1932/07/28, MRN [id number]  PCP:  Benson Eleanor Rung, NP  Cardiologist:  Redell Leiter, MD    Referring MD: Benson Eleanor PARAS*    ASSESSMENT:    1. Coronary artery disease of bypass graft of native heart with stable angina pectoris (HCC)   2. SVT (supraventricular tachycardia) (HCC)   3. Symptomatic PVCs   4. Primary hypertension   5. Infrarenal abdominal aortic aneurysm (AAA) without rupture (HCC)    PLAN:    In order of problems listed above:  Shannon Walker is doing very well with very careful conservative medical therapy she has undergone previous angiography has known severe coronary artery disease and was felt not to be a good candidate for revascularization.  Hide continue her current medical treatment pleased to see she is back on her clopidogrel  and is well-tolerated continue that along with oral mononitrate nitroglycerin  if needed beta-blocker and I asked her to at least start over-the-counter fish oil supplement No recurrence of her SVT continue her current beta-blocker Stable no symptomatic PVCs Well-controlled tolerating beta-blocker and oral nitrate without hypotension In view of her age and all of her medical problems not pursue her abdominal aortic aneurysm at this time I checked with the patient and her daughter they have no knowledge she has carotid artery disease we decided not to do any diagnostic cardiac testing at this time   Next appointment: 6 months   Medication Adjustments/Labs and Tests Ordered: Current medicines are reviewed at length with the patient today.  Concerns regarding medicines are outlined above.  No orders of the defined types were placed in this encounter.  No orders of the defined types were placed in this encounter.    History of Present Illness:    Shannon Walker is a 89 y.o. female with a hx of SVT symptomatic PVCs hypertension and abdominal aortic  aneurysm 39 mm April 2022 last seen 03/29/2021.  She underwent coronary angiography 04/24/2021 showing left main and multivessel CAD was not revascularized.  Most recently had a small chronic intracranial hemorrhage and neurology recommended discontinue aspirin  and continuing clopidogrel .  At her last office visit 12/19/2023 there is a notation that she is off lipid-lowering therapy statin intolerance and clopidogrel .  There is also notation that she sees cardiology for carotid artery disease but on reviewing my records in Care Everywhere I cannot find any diagnostic carotid imaging has been performed  She had an echocardiogram at [facility] 07/31/2023 left ventricle normal in size ejection fraction 45 to 50% moderate left atrial enlargement.  Mitral to moderate regurgitation.    Hospital records review that she had an elevated troponin in the context of intra cranial hemorrhage and had her anticoagulant therapy interrupted.  She was diagnosed as ACS with her known CAD and new type II MI secondary to intracranial hemorrhage and hypertensive urgency hyperlipidemia   compliance with diet, lifestyle and medications: Yes  She is here with her daughter she indeed is taking clopidogrel  and adamantly declines any lipid-lowering prescription medication but told me she would go back to her over-the-counter fish oil supplements 1 or 2 a day She has done remarkably well her gait is stable she is doing activities like housework and cutting the grass rarely has anginal discomfort usually relieved with rest at times she will take a nitroglycerin  with good effect She has not had any of her tachycardia syncope lightheadedness shortness of breath.  She had  recent labs at her PCP office 12/19/2023 hemoglobin normal 15.2 platelets 249,000 A1c 5.9% CMP with a potassium of 4 open creatinine 0.7 GFR 82 cc/min Past Medical History:  Diagnosis Date   Acquired hypothyroidism 09/16/2015   Acute right ankle pain 08/23/2020    Allergic rhinitis due to pollen 11/02/2011   Aneurysm of abdominal aorta (HCC)    Angina pectoris (HCC) 03/20/2021   Anxiety 08/21/2017   Aortic atherosclerosis (HCC) 05/16/2020   Arthritis, midfoot 09/16/2015   Bilateral carotid artery stenosis 12/09/2012   CAD (coronary artery disease) 04/30/2021   Calculus of kidney    Carpal tunnel syndrome of right wrist 06/22/2019   Chest pain 12/09/2012   Cystocele, lateral    Cystocele, midline    Dyspnea and respiratory abnormality 12/09/2012   Edema of left foot 06/12/2017   Elevated blood pressure reading 12/09/2012   Encounter for long-term (current) use of aspirin  12/09/2012   Essential hypertension 12/09/2012   Last Assessment & Plan:   Relevant Hx:  Course:  Daily Update:  Today's Plan:this is stable for her at this time and will follow her     Electronically signed by: Eleanor Merlynn Lady, NP  11/16/15 1241     Gastroesophageal reflux disease without esophagitis 08/23/2017   Headache 12/09/2012   Hematuria, unspecified    HTN (hypertension) 10/05/2013   Idiopathic chronic gout 02/05/2020   Left foot pain 06/12/2017   Malaise and fatigue 12/09/2012   Last Assessment & Plan:   Relevant Hx:  Course:  Daily Update:  Today's Plan:she is feeling her energy is returning for her of which she is really pleased with     Electronically signed by: Eleanor Merlynn Lady, NP  11/16/15 1247     Menopausal disorder 09/16/2015   Last Assessment & Plan:   Relevant Hx:  Course:  Daily Update:  Today's Plan:this is stable for her and discussed UTI and relationship with lack of estrogen her UA here today was clear for her which she was pleased with     Electronically signed by: Eleanor Merlynn Lady, NP  11/16/15 1246     Mild episode of recurrent major depressive disorder (HCC) 12/06/2017   Mixed hyperlipidemia 09/16/2015   Moderate obstructive sleep apnea 10/06/2021   Morbid obesity (HCC)    Neoplasm of uncertain behavior of lip,  oral cavity, 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   Nonspecific abnormal cardiovascular system function study 12/09/2012   NSTEMI (non-ST elevated myocardial infarction) (HCC) 04/24/2021   Obesity with body mass index of 30.0-39.9 09/16/2015   Last Assessment & Plan:   Relevant Hx:  Course:  Daily Update:  Today's Plan:she is doing the next 56 days and has lost 10 pounds of which she is very proud of as this is a lifestyle change for her and she is pleased with her efforts since she has never really been able to lose weight before and is hopeful she will be able to walk some with the weight loss     Electronically signed by: Eleanor Mini   Occlusion and stenosis of carotid artery 12/09/2012   Occlusion and stenosis of multiple and bilateral precerebral arteries 12/09/2012   Osteopenia 09/16/2015   Other abnormal glucose    Pain in right hand 05/07/2019   Palpitations 10/05/2013   Pre-syncope 10/05/2013   Prediabetes 09/16/2015   Primary osteoarthritis involving multiple joints 05/16/2020   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  Located infrarenal abdominal aorta and measured 3.4cm,  previous 2.2 cm was last seen     Shortness of breath 12/09/2012   Statin myopathy 06/08/2021   Subfibular impingement of left lower extremity 06/12/2017   SVT (supraventricular tachycardia) (HCC) 05/23/2017   Symptomatic PVCs 05/23/2017   Symptoms involving cardiovascular system 12/09/2012   Tumor of soft tissue of neck 09/16/2015   Last Assessment & Plan:   Relevant Hx:  Course:  Daily Update:  Today's Plan:follows [facility] and her last scan was stable for her      Electronically signed by: Eleanor Merlynn Lady, NP  11/16/15 1242     Unstable angina (HCC) 04/28/2021   Urethral caruncle 10/28/2019   Urge incontinence    Vaginal enterocele, congenital or acquired    Vitamin D deficiency     Current Medications: Current Meds  Medication Sig    ALPRAZolam  (XANAX ) 0.5 MG tablet Take 0.25 mg by mouth every 8 (eight) hours as needed for anxiety.   clopidogrel  (PLAVIX ) 75 MG tablet Take 1 tablet (75 mg total) by mouth daily.   cyanocobalamin 100 MCG tablet Take 100 mcg by mouth daily.   ezetimibe  (ZETIA ) 10 MG tablet Take 1 tablet (10 mg total) by mouth daily.   isosorbide  mononitrate (IMDUR ) 30 MG 24 hr tablet Take 1 tablet by mouth twice daily   levothyroxine  (SYNTHROID ) 50 MCG tablet Take 50 mcg by mouth daily before breakfast.   Magnesium  Gluconate 550 MG TABS Take 500 mg by mouth daily.   metoprolol  tartrate (LOPRESSOR ) 25 MG tablet Take 0.5 tablets (12.5 mg total) by mouth daily.   nitroGLYCERIN  (NITROSTAT ) 0.4 MG SL tablet DISSOLVE ONE TABLET UNDER THE TONGUE EVERY 5 MINUTES AS NEEDED FOR CHEST PAIN.  DO NOT EXCEED A TOTAL OF 3 DOSES IN 15 MINUTES   [DISCONTINUED] Vitamin D, Cholecalciferol, 1000 units CAPS Take 2,000 Units by mouth daily.       EKGs/Labs/Other Studies Reviewed:    The following studies were reviewed today:  Cardiac Studies & Procedures   ______________________________________________________________________________________________ CARDIAC CATHETERIZATION  CARDIAC CATHETERIZATION 04/24/2021  Conclusion   Mid RCA lesion is 99% stenosed.   Prox RCA lesion is 40% stenosed.   Ost LM to Dist LM lesion is 60% stenosed.   Dist LM to Prox LAD lesion is 60% stenosed.   Mid LAD lesion is 99% stenosed.   1st Diag lesion is 60% stenosed.  Moderate to severe distal left main stenosis Moderate, heavily calcified ostial LAD stenosis. Severe heavily calcified mid LAD stenosis just past the takeoff of the diagonal branch. The diagonal branch has diffuse moderate stenosis. Mild non-obstructive disease in the Circumflex. The RCA is a large dominant vessel with heavy calcification in the proximal and mid vessel. The mid RCA has a severe, heavily calcified stenosis that is a functional CTO. The distal RCA branches fill from  antegrade flow and from left to right collaterals. LVEDP 7 mmHg  Recommendations: She has severe distal left main disease and severe, calcific disease in the RCA and LAD. Her disease would be best treated with CABG as I am not sure we would have a favorable result with PCI. She will be a high risk candidate for CABG given her age but overall she seems relatively functional. PCI will not be a good option for revascularization. Will ask CT surgery to see her for CABG. Resume IV heparin  8 hours post sheath pull. Continue ASA, statin and beta blocker.  Findings Coronary Findings Diagnostic  Dominance: Right  Left Main Ost LM to [facility]  LM lesion is 60% stenosed. The lesion is calcified. Dist LM to Prox LAD lesion is 60% stenosed. The lesion is calcified.  Left Anterior Descending Mid LAD lesion is 99% stenosed. The lesion is calcified.  First Diagonal Branch Vessel is moderate in size. 1st Diag lesion is 60% stenosed.  Right Coronary Artery Prox RCA lesion is 40% stenosed. The lesion is calcified. Mid RCA lesion is 99% stenosed. The lesion is calcified.  Third Right Posterolateral Branch Collaterals 3rd RPL filled by collaterals from 2nd Sept.  Intervention  No interventions have been documented.     ECHOCARDIOGRAM  ECHOCARDIOGRAM COMPLETE 04/24/2021  Narrative ECHOCARDIOGRAM REPORT    Patient Name:   Shannon Walker Date of Exam: 04/24/2021 Medical Rec #:  [id number]        Height:       62.0 in Accession #:    [id number]       Weight:       207.2 lb Date of Birth:  1932/08/06        BSA:          1.941 m Patient Age:    89 years         BP:           131/60 mmHg Patient Gender: F                HR:           92 bpm. Exam Location:  Inpatient  Procedure: 2D Echo, Cardiac Doppler, Color Doppler, Strain Analysis and 3D Echo  Indications:    CAD Native Vessel I25.10  History:        Patient has prior history of Echocardiogram examinations, most recent 03/15/2021.  Carotid Disease; Risk Factors:Hypertension and Dyslipidemia. Hypothyroidism.  Sonographer:    Annabella Fell RDCS Referring Phys: [id number] CHRISTOPHER D MCALHANY  IMPRESSIONS   1. Global longitudinal strain is -15.2%. Left ventricular ejection fraction, by estimation, is 65 to 70%. The left ventricle has normal function. The left ventricle has no regional wall motion abnormalities. There is mild left ventricular hypertrophy. Left ventricular diastolic parameters are indeterminate. 2. Right ventricular systolic function is normal. The right ventricular size is normal. 3. Trivial mitral valve regurgitation. Moderate mitral annular calcification. 4. Aortic valve regurgitation is not visualized. Aortic valve sclerosis is present, with no evidence of aortic valve stenosis. 5. The inferior vena cava is normal in size with greater than 50% respiratory variability, suggesting right atrial pressure of 3 mmHg.  FINDINGS Left Ventricle: Global longitudinal strain is -15.2%. Left ventricular ejection fraction, by estimation, is 65 to 70%. The left ventricle has normal function. The left ventricle has no regional wall motion abnormalities. The left ventricular internal cavity size was normal in size. There is mild left ventricular hypertrophy. Left ventricular diastolic parameters are indeterminate.  Right Ventricle: The right ventricular size is normal. Right vetricular wall thickness was not assessed. Right ventricular systolic function is normal.  Left Atrium: Left atrial size was normal in size.  Right Atrium: Right atrial size was normal in size.  Pericardium: There is no evidence of pericardial effusion.  Mitral Valve: There is mild thickening of the mitral valve leaflet(s). Moderate mitral annular calcification. Trivial mitral valve regurgitation. MV peak gradient, 11.1 mmHg. The mean mitral valve gradient is 4.0 mmHg.  Tricuspid Valve: The tricuspid valve is normal in structure. Tricuspid valve  regurgitation is trivial.  Aortic Valve: Aortic valve regurgitation is not visualized. Aortic valve sclerosis is present, with no evidence of  aortic valve stenosis.  Pulmonic Valve: The pulmonic valve was normal in structure. Pulmonic valve regurgitation is not visualized.  Aorta: The aortic root and ascending aorta are structurally normal, with no evidence of dilitation.  Venous: The inferior vena cava is normal in size with greater than 50% respiratory variability, suggesting right atrial pressure of 3 mmHg.   LEFT VENTRICLE PLAX 2D LVIDd:         4.30 cm   Diastology LVIDs:         2.77 cm   LV e' medial:    4.38 cm/s LV PW:         1.20 cm   LV E/e' medial:  16.6 LV IVS:        1.30 cm   LV e' lateral:   6.66 cm/s LVOT diam:     1.90 cm   LV E/e' lateral: 10.9 LV SV:         50 LV SV Index:   26 LVOT Area:     2.84 cm  3D Volume EF: 3D EF:        54 % LV EDV:       76 ml LV ESV:       35 ml LV SV:        41 ml  RIGHT VENTRICLE RV S prime:     11.20 cm/s  LEFT ATRIUM             Index LA diam:        3.50 cm 1.80 cm/m LA Vol (A2C):   21.9 ml 11.28 ml/m LA Vol (A4C):   25.0 ml 12.88 ml/m LA Biplane Vol: 23.7 ml 12.21 ml/m AORTIC VALVE LVOT Vmax:   98.70 cm/s LVOT Vmean:  55.400 cm/s LVOT VTI:    0.176 m  AORTA Ao Root diam: 3.20 cm Ao Asc diam:  3.30 cm  MITRAL VALVE MV Area (PHT): 4.29 cm     SHUNTS MV Area VTI:   2.02 cm     Systemic VTI:  0.18 m MV Peak grad:  11.1 mmHg    Systemic Diam: 1.90 cm MV Mean grad:  4.0 mmHg MV Vmax:       1.66 m/s MV Vmean:      90.4 cm/s MV Decel Time: 177 msec MV E velocity: 72.60 cm/s MV A velocity: 162.00 cm/s MV E/A ratio:  0.45  Vina Gull MD Electronically signed by Vina Gull MD Signature Date/Time: 04/24/2021/6:09:20 PM    Final    MONITORS  CARDIAC EVENT MONITOR 08/20/2017  Narrative Dates:                                    08/20/2017 2 09/02/2017 Indication:                               SVT Ordering;                               Dr. Monetta Interpreting:                           Dr. Monetta  Baseline transmission:             Sinus tachycardia 122 bpm  Atrial arrhythmia:  One event with atrial premature contractions  Ventricular arrhythmia:            5 events with ventricular premature contractions  Conduction abnormality:          None  Bradycardia:                           None  Symptoms:                             No symptomatic events   Conclusion:                             Atrial and ventricular premature contractions asymptomatic       ______________________________________________________________________________________________          Recent Labs: No results found for requested labs within last 365 days.  Recent Lipid Panel    Component Value Date/Time   CHOL 138 12/13/2021 1107   TRIG 202 (H) 12/13/2021 1107   HDL 38 (L) 12/13/2021 1107   CHOLHDL 3.6 12/13/2021 1107   CHOLHDL 4.9 04/26/2021 0338   VLDL 30 04/26/2021 0338   LDLCALC 66 12/13/2021 1107    Physical Exam:    VS:  BP 110/70   Pulse 60   Ht 5' 3 (1.6 m)   Wt 165 lb 12.8 oz (75.2 kg)   SpO2 99%   BMI 29.37 kg/m     Wt Readings from Last 3 Encounters:  01/02/24 165 lb 12.8 oz (75.2 kg)  10/01/23 165 lb 12.8 oz (75.2 kg)  07/04/23 166 lb (75.3 kg)     GEN: Marked improvement in her appearance she does not appear ill and actually looks younger than her age well nourished, well developed in no acute distress HEENT: Normal NECK: No JVD; No carotid bruits LYMPHATICS: No lymphadenopathy CARDIAC: She has a grade 1/6 to 2/6 systolic ejection murmur RRR, no murmurs, rubs, gallops RESPIRATORY:  Clear to auscultation without rales, wheezing or rhonchi  ABDOMEN: Soft, non-tender, non-distended MUSCULOSKELETAL:  No edema; No deformity  SKIN: Warm and dry NEUROLOGIC:  Alert and oriented x 3 PSYCHIATRIC:  Normal affect    Signed, Redell Leiter, MD   01/02/2024 8:23 AM    [facility]

## 2024-01-02 ENCOUNTER — Encounter: Payer: Self-pay | Admitting: Cardiology

## 2024-01-02 ENCOUNTER — Ambulatory Visit: Attending: Cardiology | Admitting: Cardiology

## 2024-01-02 VITALS — BP 110/70 | HR 60 | Ht 63.0 in | Wt 165.8 lb

## 2024-01-02 DIAGNOSIS — I10 Essential (primary) hypertension: Secondary | ICD-10-CM

## 2024-01-02 DIAGNOSIS — I471 Supraventricular tachycardia, unspecified: Secondary | ICD-10-CM | POA: Diagnosis not present

## 2024-01-02 DIAGNOSIS — I493 Ventricular premature depolarization: Secondary | ICD-10-CM

## 2024-01-02 DIAGNOSIS — I25708 Atherosclerosis of coronary artery bypass graft(s), unspecified, with other forms of angina pectoris: Secondary | ICD-10-CM

## 2024-01-02 DIAGNOSIS — I7143 Infrarenal abdominal aortic aneurysm, without rupture: Secondary | ICD-10-CM

## 2024-01-02 NOTE — Patient Instructions (Signed)
 Medication Instructions:  Your physician recommends that you continue on your current medications as directed. Please refer to the Current Medication list given to you today.  *If you need a refill on your cardiac medications before your next appointment, please call your pharmacy*  Lab Work: None If you have labs (blood work) drawn today and your tests are completely normal, you will receive your results only by: MyChart Message (if you have MyChart) OR A paper copy in the mail If you have any lab test that is abnormal or we need to change your treatment, we will call you to review the results.  Testing/Procedures: None  Follow-Up: At [facility], you and your health needs are our priority.  As part of our continuing mission to provide you with exceptional heart care, our providers are all part of one team.  This team includes your primary Cardiologist (physician) and Advanced Practice Providers or APPs (Physician Assistants and Nurse Practitioners) who all work together to provide you with the care you need, when you need it.  Your next appointment:   6 month(s)  Provider:   Redell Leiter, MD    We recommend signing up for the patient portal called MyChart.  Sign up information is provided on this After Visit Summary.  MyChart is used to connect with patients for Virtual Visits (Telemedicine).  Patients are able to view lab/test results, encounter notes, upcoming appointments, etc.  Non-urgent messages can be sent to your provider as well.   To learn more about what you can do with MyChart, go to [email or web address].   Other Instructions Restart OTC Fish Oil.

## 2024-01-07 ENCOUNTER — Other Ambulatory Visit (HOSPITAL_COMMUNITY)

## 2024-01-10 ENCOUNTER — Ambulatory Visit (HOSPITAL_COMMUNITY)
Admission: RE | Admit: 2024-01-10 | Discharge: 2024-01-10 | Disposition: A | Discharge status: Home / Self Care | Source: Ambulatory Visit | Attending: Interventional Cardiology | Admitting: Interventional Cardiology

## 2024-01-10 DIAGNOSIS — I714 Abdominal aortic aneurysm, without rupture, unspecified: Secondary | ICD-10-CM | POA: Insufficient documentation

## 2024-01-19 ENCOUNTER — Ambulatory Visit: Payer: Self-pay | Admitting: Cardiology

## 2024-01-23 ENCOUNTER — Encounter: Payer: Self-pay | Admitting: Nurse Practitioner

## 2024-01-23 ENCOUNTER — Ambulatory Visit: Admitting: Nurse Practitioner

## 2024-01-23 VITALS — BP 126/72 | HR 67 | Temp 97.8°F | Ht 63.0 in | Wt 169.8 lb

## 2024-01-23 DIAGNOSIS — G4733 Obstructive sleep apnea (adult) (pediatric): Secondary | ICD-10-CM

## 2024-01-23 NOTE — Telephone Encounter (Signed)
 Left message to call office.

## 2024-01-23 NOTE — Progress Notes (Signed)
 @Patient  ID: Shannon Walker, female    DOB: November 30, 1932, 89 y.o.   MRN: [id number]  Chief Complaint  Patient presents with   Follow-up    OSA f/u Pt states she does not sleep well due to 3 crushed vertebrae in her back. Pt currently sleeps in a recliner.     Referring provider: Benson Eleanor PARAS*  HPI: 89 year old female, never smoker followed for moderate obstructive sleep apnea.  She is a patient of Dr. Magdaleno and last seen in office on 08/20/2022.  Past medical history significant for CAD, hypertension, bilateral carotid artery stenosis, AAA, NSTEMI, allergic rhinitis, GERD, hypothyroid, OA, HLD, prediabetes, vitamin D deficiency, obesity.  TEST/EVENTS:  09/25/2021 HST: AHI 22, SPO2 low 75%.  Moderate obstructive sleep apnea  01/23/2024: Today - follow up  12/22/2023-01/20/2024: CPAP 5-15 cmH2O 29/30 days; 10% >4 hr; average use 2 hr 33 min Pressure 95th 10.6 Leaks 95th 61.4 AHI 16.9  Allergies  Allergen Reactions   Bee Venom Anaphylaxis   Praluent  [Alirocumab ] Shortness Of Breath   Atorvastatin     Myalgias on atorvastatin 80mg  daily   Celecoxib Other (See Comments)    Abdominal pain    Iodine Hives   Repatha  [Evolocumab ]     Muscle aches   Rosuvastatin      Myalgias on rosuvastatin  10mg  and 20mg  daily and 20mg  once weekly   Sulfamethoxazole-Trimethoprim Other (See Comments)    Restless legs, involuntary movements    Codeine Palpitations   Iodinated Contrast Media Rash   Nabumetone Other (See Comments) and Palpitations    Tachycardia     Immunization History  Administered Date(s) Administered   Influenza Split 02/25/2018   Moderna Sars-Covid-2 Vaccination 06/22/2019, 07/20/2019   Pneumococcal Conjugate-13 04/08/2013    Past Medical History:  Diagnosis Date   Acquired hypothyroidism 09/16/2015   Acute right ankle pain 08/23/2020   Allergic rhinitis due to pollen 11/02/2011   Aneurysm of abdominal aorta (HCC)    Angina pectoris (HCC) 03/20/2021    Anxiety 08/21/2017   Aortic atherosclerosis (HCC) 05/16/2020   Arthritis, midfoot 09/16/2015   Bilateral carotid artery stenosis 12/09/2012   CAD (coronary artery disease) 04/30/2021   Calculus of kidney    Carpal tunnel syndrome of right wrist 06/22/2019   Chest pain 12/09/2012   Cystocele, lateral    Cystocele, midline    Dyspnea and respiratory abnormality 12/09/2012   Edema of left foot 06/12/2017   Elevated blood pressure reading 12/09/2012   Encounter for long-term (current) use of aspirin  12/09/2012   Essential hypertension 12/09/2012   Last Assessment & Plan:   Relevant Hx:  Course:  Daily Update:  Today's Plan:this is stable for her at this time and will follow her     Electronically signed by: Eleanor Merlynn Benson, NP  11/16/15 1241     Gastroesophageal reflux disease without esophagitis 08/23/2017   Headache 12/09/2012   Hematuria, unspecified    HTN (hypertension) 10/05/2013   Idiopathic chronic gout 02/05/2020   Left foot pain 06/12/2017   Malaise and fatigue 12/09/2012   Last Assessment & Plan:   Relevant Hx:  Course:  Daily Update:  Today's Plan:she is feeling her energy is returning for her of which she is really pleased with     Electronically signed by: Eleanor Merlynn Benson, NP  11/16/15 1247     Menopausal disorder 09/16/2015   Last Assessment & Plan:   Relevant Hx:  Course:  Daily Update:  Today's Plan:this is stable for her and discussed UTI and  relationship with lack of estrogen her UA here today was clear for her which she was pleased with     Electronically signed by: Eleanor Merlynn Lady, NP  11/16/15 1246     Mild episode of recurrent major depressive disorder (HCC) 12/06/2017   Mixed hyperlipidemia 09/16/2015   Moderate obstructive sleep apnea 10/06/2021   Morbid obesity (HCC)    Neoplasm of uncertain behavior of lip, oral cavity, 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   Nonspecific abnormal cardiovascular system function study 12/09/2012   NSTEMI (non-ST  elevated myocardial infarction) (HCC) 04/24/2021   Obesity with body mass index of 30.0-39.9 09/16/2015   Last Assessment & Plan:   Relevant Hx:  Course:  Daily Update:  Today's Plan:she is doing the next 56 days and has lost 10 pounds of which she is very proud of as this is a lifestyle change for her and she is pleased with her efforts since she has never really been able to lose weight before and is hopeful she will be able to walk some with the weight loss     Electronically signed by: Eleanor Mini   Occlusion and stenosis of carotid artery 12/09/2012   Occlusion and stenosis of multiple and bilateral precerebral arteries 12/09/2012   Osteopenia 09/16/2015   Other abnormal glucose    Pain in right hand 05/07/2019   Palpitations 10/05/2013   Pre-syncope 10/05/2013   Prediabetes 09/16/2015   Primary osteoarthritis involving multiple joints 05/16/2020   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  Located infrarenal abdominal aorta and measured 3.4cm, previous 2.2 cm was last seen     Shortness of breath 12/09/2012   Statin myopathy 06/08/2021   Subfibular impingement of left lower extremity 06/12/2017   SVT (supraventricular tachycardia) (HCC) 05/23/2017   Symptomatic PVCs 05/23/2017   Symptoms involving cardiovascular system 12/09/2012   Tumor of soft tissue of neck 09/16/2015   Last Assessment & Plan:   Relevant Hx:  Course:  Daily Update:  Today's Plan:follows through [facility] yearly and her last scan was stable for her      Electronically signed by: Eleanor Merlynn Lady, NP  11/16/15 1242     Unstable angina (HCC) 04/28/2021   Urethral caruncle 10/28/2019   Urge incontinence    Vaginal enterocele, congenital or acquired    Vitamin D deficiency     Tobacco History: Social History   Tobacco Use  Smoking Status Never   Passive exposure: Past  Smokeless Tobacco Never   Counseling given: Not Answered   Outpatient  Medications Prior to Visit  Medication Sig Dispense Refill   ALPRAZolam  (XANAX ) 0.5 MG tablet Take 0.25 mg by mouth every 8 (eight) hours as needed for anxiety.     clopidogrel  (PLAVIX ) 75 MG tablet Take 1 tablet (75 mg total) by mouth daily. 90 tablet 3   cyanocobalamin 100 MCG tablet Take 100 mcg by mouth daily.     isosorbide  mononitrate (IMDUR ) 30 MG 24 hr tablet Take 1 tablet by mouth twice daily 180 tablet 3   levothyroxine  (SYNTHROID ) 50 MCG tablet Take 50 mcg by mouth daily before breakfast.     Magnesium  Gluconate 550 MG TABS Take 500 mg by mouth daily.     metoprolol  tartrate (LOPRESSOR ) 25 MG tablet Take 0.5 tablets (12.5 mg total) by mouth daily. 45 tablet 3   nitroGLYCERIN  (NITROSTAT ) 0.4 MG SL tablet DISSOLVE ONE TABLET UNDER THE TONGUE EVERY 5 MINUTES AS NEEDED FOR CHEST PAIN.  DO  NOT EXCEED A TOTAL OF 3 DOSES IN 15 MINUTES 75 tablet 3   Omega-3 Fatty Acids (FISH OIL) 300 MG CAPS Take by mouth.     ezetimibe  (ZETIA ) 10 MG tablet Take 1 tablet (10 mg total) by mouth daily. (Patient not taking: Reported on 01/23/2024) 90 tablet 3   No facility-administered medications prior to visit.     Review of Systems:   Constitutional: No weight loss or gain, night sweats, fevers, chills. +excessive daytime fatigue (some improved) HEENT: No headaches, difficulty swallowing, tooth/dental problems, or sore throat. No sneezing, itching, ear ache, nasal congestion, or post nasal drip. +morning headaches (resolved) CV:  No chest pain, orthopnea, PND, swelling in lower extremities, anasarca, dizziness, palpitations, syncope Resp: No shortness of breath with exertion or at rest. No excess mucus or change in color of mucus. No productive or non-productive. No hemoptysis. No wheezing.  No chest wall deformity Skin: No rash, lesions, ulcerations MSK:  No joint pain or swelling.  No decreased range of motion.  No back pain. Neuro: No dizziness or lightheadedness.  Psych: No depression or anxiety. Mood  stable.     Physical Exam:  BP 126/72   Pulse 67   Temp 97.8 F (36.6 C)   Ht 5' 3 (1.6 m)   Wt 169 lb 12.8 oz (77 kg)   SpO2 99% Comment: RA  BMI 30.08 kg/m   GEN: Pleasant, interactive, well-appearing; obese; in no acute distress. HEENT:  Normocephalic and atraumatic. PERRLA. Sclera white. Nasal turbinates pink, moist and patent bilaterally. No rhinorrhea present. Oropharynx pink and moist, without exudate or edema. Edentulous - wears dentures. No lesions, ulcerations, or postnasal drip.  NECK:  Supple w/ fair ROM. No JVD present. Normal carotid impulses w/o bruits. Thyroid symmetrical with no goiter or nodules palpated. No lymphadenopathy.   CV: RRR, no m/r/g, no peripheral edema. Pulses intact, +2 bilaterally. No cyanosis, pallor or clubbing. PULMONARY:  Unlabored, regular breathing. Clear bilaterally A&P w/o wheezes/rales/rhonchi. No accessory muscle use. No dullness to percussion. GI: BS present and normoactive. Soft, non-tender to palpation. No organomegaly or masses detected. No CVA tenderness. MSK: No erythema, warmth or tenderness. Cap refil <2 sec all extrem. No deformities or joint swelling noted.  Neuro: A/Ox3. No focal deficits noted.   Skin: Warm, no lesions or rashe Psych: Normal affect and behavior. Judgement and thought content appropriate.     Lab Results:  CBC    Component Value Date/Time   WBC 6.5 12/13/2021 1107   WBC 8.6 04/28/2021 1941   RBC 4.40 12/13/2021 1107   RBC 4.65 04/28/2021 1941   HGB 14.5 12/13/2021 1107   HCT 43.2 12/13/2021 1107   PLT 268 12/13/2021 1107   MCV 98 (H) 12/13/2021 1107   MCH 33.0 12/13/2021 1107   MCH 32.7 04/28/2021 1941   MCHC 33.6 12/13/2021 1107   MCHC 33.0 04/28/2021 1941   RDW 12.8 12/13/2021 1107   LYMPHSABS 2.7 06/30/2014 0755   MONOABS 0.6 06/30/2014 0755   EOSABS 0.3 06/30/2014 0755   BASOSABS 0.0 06/30/2014 0755    BMET    Component Value Date/Time   NA 142 12/13/2021 1107   K 4.6 12/13/2021 1107    CL 104 12/13/2021 1107   CO2 25 12/13/2021 1107   GLUCOSE 94 12/13/2021 1107   GLUCOSE 118 (H) 04/28/2021 1941   BUN 11 12/13/2021 1107   CREATININE 0.66 12/13/2021 1107   CALCIUM  9.4 12/13/2021 1107   GFRNONAA >60 04/28/2021 1941   GFRAA >90 06/30/2014  0755    BNP    Component Value Date/Time   BNP 16.9 06/30/2014 0755     Imaging:  VAS US  AAA DUPLEX Result Date: 01/10/2024 ABDOMINAL AORTA STUDY Patient Name:  TAYLYNN EASTON  Date of Exam:   01/10/2024 Medical Rec #: [id number]         Accession #:    [id number] Date of Birth: 1933/05/10         Patient Gender: F Patient Age:   46 years Exam Location:  [address] Procedure:      VAS US  AAA DUPLEX Referring Phys: CANDYCE REEK --------------------------------------------------------------------------------  Indications: AAA without rupture Risk Factors: Hypertension, no history of smoking. Limitations: Air/bowel gas.  Comparison Study: 01/08/23 AAA is seen in the distal aorta measuring 4.1x4.1 cm. Performing Technologist: Dena Pane  Examination Guidelines: A complete evaluation includes B-mode imaging, spectral Doppler, color Doppler, and power Doppler as needed of all accessible portions of each vessel. Bilateral testing is considered an integral part of a complete examination. Limited examinations for reoccurring indications may be performed as noted.  Abdominal Aorta Findings: +-----------+-------+----------+----------+--------+--------+--------+ Location   AP (cm)Trans (cm)PSV (cm/s)WaveformThrombusComments +-----------+-------+----------+----------+--------+--------+--------+ Proximal   2.30   1.80      62                                 +-----------+-------+----------+----------+--------+--------+--------+ Mid        4.30   4.40      71                                 +-----------+-------+----------+----------+--------+--------+--------+ Distal     2.00   2.00      89                                  +-----------+-------+----------+----------+--------+--------+--------+ RT CIA Prox1.2    1.5       64                                 +-----------+-------+----------+----------+--------+--------+--------+ RT EIA Prox0.8    0.9       103                                +-----------+-------+----------+----------+--------+--------+--------+ LT CIA Prox0.9    1.0       63                                 +-----------+-------+----------+----------+--------+--------+--------+ LT EIA Prox0.8    0.9       87                                 +-----------+-------+----------+----------+--------+--------+--------+  Summary: Abdominal Aorta: The largest aortic measurement is 4.4 cm. AAA is seen in the mid/distal area. Patent iliac arteries, there is no evidence of stenosis. IVC/Iliac: There is no evidence of thrombus involving the IVC.  *See table(s) above for measurements and observations.  Electronically signed by Dorn Lesches MD on 01/10/2024 at 3:42:50 PM.    Final     Administration History  None           No data to display          No results found for: NITRICOXIDE      Assessment & Plan:   No problem-specific Assessment & Plan notes found for this encounter.    I spent 32 minutes of dedicated to the care of this patient on the date of this encounter to include pre-visit review of records, face-to-face time with the patient discussing conditions above, post visit ordering of testing, clinical documentation with the electronic health record, making appropriate referrals as documented, and communicating necessary findings to members of the patients care team.  Comer LULLA Rouleau, NP 01/23/2024  Pt aware and understands NP's role.

## 2024-01-23 NOTE — Patient Instructions (Signed)
 Increase use CPAP every night, minimum of 4-6 hours a night.  Change equipment as directed. Wash your tubing with warm soap and water daily, hang to dry. Wash humidifier portion weekly. Use bottled, distilled water and change daily Be aware of reduced alertness and do not drive or operate heavy machinery if experiencing this or drowsiness.  Exercise encouraged, as tolerated. Healthy weight management discussed.  Avoid or decrease alcohol consumption and medications that make you more sleepy, if possible. Notify if persistent daytime sleepiness occurs even with consistent use of PAP therapy.  Change CPAP supplies... Every month Mask cushions and/or nasal pillows CPAP machine filters Every 3 months Mask frame (not including the headgear) CPAP tubing Every 6 months Mask headgear Chin strap (if applicable) Humidifier water tub  Adjusted CPAP settings CPAP titration study ordered  Follow up in 6 weeks with Katie Halona Amstutz,NP. If symptoms do not improve or worsen, please contact office for sooner follow up

## 2024-01-24 ENCOUNTER — Encounter: Payer: Self-pay | Admitting: Nurse Practitioner

## 2024-01-24 NOTE — Assessment & Plan Note (Signed)
 Moderate OSA. Suboptimal compliance and persistent breakthrough events despite pressure adjustments. Suspect some of this is related to leaks. Provided with new AirFit F30 medium mask sample today. Advised to resume CPAP and increase usage to minimum 4-6 hours nightly. Will order CPAP titration study. Reassess at follow up. Reviewed risks of untreated OSA and potential treatment options. Safe driving practices reviewed. Healthy weight loss encouraged.   Patient Instructions  Increase use CPAP every night, minimum of 4-6 hours a night.  Change equipment as directed. Wash your tubing with warm soap and water daily, hang to dry. Wash humidifier portion weekly. Use bottled, distilled water and change daily Be aware of reduced alertness and do not drive or operate heavy machinery if experiencing this or drowsiness.  Exercise encouraged, as tolerated. Healthy weight management discussed.  Avoid or decrease alcohol consumption and medications that make you more sleepy, if possible. Notify if persistent daytime sleepiness occurs even with consistent use of PAP therapy.  Change CPAP supplies... Every month Mask cushions and/or nasal pillows CPAP machine filters Every 3 months Mask frame (not including the headgear) CPAP tubing Every 6 months Mask headgear Chin strap (if applicable) Humidifier water tub  Adjusted CPAP settings CPAP titration study ordered  Follow up in 6 weeks with Katie Makita Blow,NP. If symptoms do not improve or worsen, please contact office for sooner follow up

## 2024-02-20 ENCOUNTER — Other Ambulatory Visit: Payer: Self-pay

## 2024-02-20 MED ORDER — METOPROLOL TARTRATE 25 MG PO TABS: 12.5000 mg | ORAL_TABLET | Freq: Every day | ORAL | 3 refills | Status: DC

## 2024-03-09 ENCOUNTER — Other Ambulatory Visit: Payer: Self-pay | Admitting: Specialist

## 2024-03-09 DIAGNOSIS — Z1231 Encounter for screening mammogram for malignant neoplasm of breast: Secondary | ICD-10-CM

## 2024-03-20 ENCOUNTER — Encounter (HOSPITAL_BASED_OUTPATIENT_CLINIC_OR_DEPARTMENT_OTHER): Admitting: Pulmonary Disease

## 2024-04-02 ENCOUNTER — Ambulatory Visit: Admitting: Nurse Practitioner

## 2024-04-02 ENCOUNTER — Encounter: Payer: Self-pay | Admitting: Nurse Practitioner

## 2024-04-02 VITALS — BP 126/77 | HR 59 | Ht 63.0 in | Wt 170.8 lb

## 2024-04-02 DIAGNOSIS — G4733 Obstructive sleep apnea (adult) (pediatric): Secondary | ICD-10-CM | POA: Diagnosis not present

## 2024-04-02 NOTE — Progress Notes (Signed)
 @Patient  ID: Shannon Walker, female    DOB: 1932-10-04, 89 y.o.   MRN: [id number]  Chief Complaint  Patient presents with   Medical Management of Chronic Issues    Pt states Cpap issues will turn on by its self     Referring provider: Benson Eleanor PARAS*  HPI: 89 year old female, never smoker followed for moderate obstructive sleep apnea.  She is a patient of Dr. Magdaleno and last seen in office on 01/23/2024 by [facility] NP.  Past medical history significant for CAD, hypertension, bilateral carotid artery stenosis, AAA, NSTEMI, allergic rhinitis, GERD, hypothyroid, OA, HLD, prediabetes, vitamin D deficiency, obesity.  TEST/EVENTS:  09/25/2021 HST: AHI 22, SPO2 low 75%.  Moderate obstructive sleep apnea  08/20/2022: OV with Dr. Shellia. F/u for OSa on CPAP. Mask is comfortable. Does have a piece that popped off. Doesn't fit right and has to use tape. Avg use 2 hr 49 min. Changed to 5-15 cmH2O. Have CPAP and mask checked.   01/23/2024: Shannon Walker is an 89 year old female who presents with CPAP compliance issues. She has been experiencing difficulty maintaining consistent use of her CPAP machine, which she has had for approximately a year and a half. Currently, she uses the CPAP only about 10% of the nights for over four hours, whereas she needs to use it 70% of the nights for insurance coverage of supplies. She attributes part of the issue to using the same CPAP supplies for six months, which may contribute to discomfort and leaks. She primarily sleeps in a recliner due to back problems and confirms wearing the CPAP while in the recliner. However, the Velcro on the headgear does not stay fastened, especially as it ages, leading to frequent disconnections.  No sleepiness while driving and only occasional daytime naps when very tired. She does not notice a significant difference in how she feels the next day whether she uses the CPAP or not. She is currently using a mask that  covers both her nose and mouth and does prefer this. No sleep parasomnias, PND.  12/22/2023-01/20/2024: CPAP 5-15 cmH2O 29/30 days; 10% >4 hr; average use 2 hr 33 min Pressure 95th 10.6 Leaks 95th 61.4 AHI 16.9  04/02/2024: Today - follow up Discussed the use of AI scribe software for clinical note transcription with the patient, who gave verbal consent to proceed. History of Present Illness  Shannon Walker is an 89 year old female who presents for follow-up regarding CPAP therapy.  She is experiencing issues with her CPAP machine, which will cut back on after she turns it off. Despite these issues, she has been more consistent with using her CPAP since our last visit. She feels better rested and has improved energy levels since being CPAP therapy. She denies any morning headaches, sleep parasomnias. No PND. She does not drive.   She experiences some leaks but they are not bothersome. No awakenings or issues with CPAP. Her sleep quality is described as 'pretty good' without significant disruptions.   03/02/2024-03/31/2024: CPAP 8-12 cmH2O 30/30 days; 100% >4 hr; average use 5 hr 9 min Pressure 95th 11.8 Leaks 95th 51.8 AHI 6  Allergies  Allergen Reactions   Bee Venom Anaphylaxis   Praluent  [Alirocumab ] Shortness Of Breath   Atorvastatin     Myalgias on atorvastatin 80mg  daily   Celecoxib Other (See Comments)    Abdominal pain    Iodine Hives   Repatha  [Evolocumab ]     Muscle aches  Rosuvastatin      Myalgias on rosuvastatin  10mg  and 20mg  daily and 20mg  once weekly   Sulfamethoxazole-Trimethoprim Other (See Comments)    Restless legs, involuntary movements    Codeine Palpitations   Iodinated Contrast Media Rash   Nabumetone Other (See Comments) and Palpitations    Tachycardia     Immunization History  Administered Date(s) Administered   Influenza Split 02/25/2018   Moderna Sars-Covid-2 Vaccination 06/22/2019, 07/20/2019   Pneumococcal Conjugate-13 04/08/2013     Past Medical History:  Diagnosis Date   Acquired hypothyroidism 09/16/2015   Acute right ankle pain 08/23/2020   Allergic rhinitis due to pollen 11/02/2011   Aneurysm of abdominal aorta    Angina pectoris 03/20/2021   Anxiety 08/21/2017   Aortic atherosclerosis 05/16/2020   Arthritis, midfoot 09/16/2015   Bilateral carotid artery stenosis 12/09/2012   CAD (coronary artery disease) 04/30/2021   Calculus of kidney    Carpal tunnel syndrome of right wrist 06/22/2019   Chest pain 12/09/2012   Cystocele, lateral    Cystocele, midline    Dyspnea and respiratory abnormality 12/09/2012   Edema of left foot 06/12/2017   Elevated blood pressure reading 12/09/2012   Encounter for long-term (current) use of aspirin  12/09/2012   Essential hypertension 12/09/2012   Last Assessment & Plan:   Relevant Hx:  Course:  Daily Update:  Today's Plan:this is stable for her at this time and will follow her     Electronically signed by: Eleanor Merlynn Lady, NP  11/16/15 1241     Gastroesophageal reflux disease without esophagitis 08/23/2017   Headache 12/09/2012   Hematuria, unspecified    HTN (hypertension) 10/05/2013   Idiopathic chronic gout 02/05/2020   Left foot pain 06/12/2017   Malaise and fatigue 12/09/2012   Last Assessment & Plan:   Relevant Hx:  Course:  Daily Update:  Today's Plan:she is feeling her energy is returning for her of which she is really pleased with     Electronically signed by: Eleanor Merlynn Lady, NP  11/16/15 1247     Menopausal disorder 09/16/2015   Last Assessment & Plan:   Relevant Hx:  Course:  Daily Update:  Today's Plan:this is stable for her and discussed UTI and relationship with lack of estrogen her UA here today was clear for her which she was pleased with     Electronically signed by: Eleanor Merlynn Lady, NP  11/16/15 1246     Mild episode of recurrent major depressive disorder 12/06/2017   Mixed hyperlipidemia 09/16/2015   Moderate  obstructive sleep apnea 10/06/2021   Morbid obesity (HCC)    Neoplasm of uncertain behavior of lip, oral cavity, 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   Nonspecific abnormal cardiovascular system function study 12/09/2012   NSTEMI (non-ST elevated myocardial infarction) (HCC) 04/24/2021   Obesity with body mass index of 30.0-39.9 09/16/2015   Last Assessment & Plan:   Relevant Hx:  Course:  Daily Update:  Today's Plan:she is doing the next 56 days and has lost 10 pounds of which she is very proud of as this is a lifestyle change for her and she is pleased with her efforts since she has never really been able to lose weight before and is hopeful she will be able to walk some with the weight loss     Electronically signed by: Eleanor Mini   Occlusion and stenosis of carotid artery 12/09/2012   Occlusion and stenosis of multiple and bilateral precerebral arteries 12/09/2012   Osteopenia 09/16/2015   Other abnormal glucose  Pain in right hand 05/07/2019   Palpitations 10/05/2013   Pre-syncope 10/05/2013   Prediabetes 09/16/2015   Primary osteoarthritis involving multiple joints 05/16/2020   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  Located infrarenal abdominal aorta and measured 3.4cm, previous 2.2 cm was last seen     Shortness of breath 12/09/2012   Statin myopathy 06/08/2021   Subfibular impingement of left lower extremity 06/12/2017   SVT (supraventricular tachycardia) 05/23/2017   Symptomatic PVCs 05/23/2017   Symptoms involving cardiovascular system 12/09/2012   Tumor of soft tissue of neck 09/16/2015   Last Assessment & Plan:   Relevant Hx:  Course:  Daily Update:  Today's Plan:follows through [facility] yearly and her last scan was stable for her      Electronically signed by: Eleanor Merlynn Lady, NP  11/16/15 1242     Unstable angina (HCC) 04/28/2021   Urethral caruncle 10/28/2019   Urge incontinence    Vaginal enterocele, congenital or  acquired    Vitamin D deficiency     Tobacco History: Social History   Tobacco Use  Smoking Status Never   Passive exposure: Past  Smokeless Tobacco Never   Counseling given: Not Answered   Outpatient Medications Prior to Visit  Medication Sig Dispense Refill   ALPRAZolam  (XANAX ) 0.5 MG tablet Take 0.25 mg by mouth every 8 (eight) hours as needed for anxiety.     clopidogrel  (PLAVIX ) 75 MG tablet Take 1 tablet (75 mg total) by mouth daily. 90 tablet 3   cyanocobalamin 100 MCG tablet Take 100 mcg by mouth daily.     isosorbide  mononitrate (IMDUR ) 30 MG 24 hr tablet Take 1 tablet by mouth twice daily 180 tablet 3   levothyroxine  (SYNTHROID ) 50 MCG tablet Take 50 mcg by mouth daily before breakfast.     Magnesium  Gluconate 550 MG TABS Take 500 mg by mouth daily.     metoprolol  tartrate (LOPRESSOR ) 25 MG tablet Take 0.5 tablets (12.5 mg total) by mouth daily. 45 tablet 3   nitroGLYCERIN  (NITROSTAT ) 0.4 MG SL tablet DISSOLVE ONE TABLET UNDER THE TONGUE EVERY 5 MINUTES AS NEEDED FOR CHEST PAIN.  DO NOT EXCEED A TOTAL OF 3 DOSES IN 15 MINUTES 75 tablet 3   Omega-3 Fatty Acids (FISH OIL) 300 MG CAPS Take by mouth.     ezetimibe  (ZETIA ) 10 MG tablet Take 1 tablet (10 mg total) by mouth daily. (Patient not taking: Reported on 04/02/2024) 90 tablet 3   No facility-administered medications prior to visit.     Review of Systems: As above; otherwise negative      Physical Exam:  BP 126/77   Pulse (!) 59   Ht 5' 3 (1.6 m) Comment: per pt  Wt 170 lb 12.8 oz (77.5 kg)   SpO2 99%   BMI 30.26 kg/m   GEN: Pleasant, interactive, well-appearing; obese; in no acute distress. HEENT:  Normocephalic and atraumatic. PERRLA. Sclera white. Nasal turbinates pink, moist and patent bilaterally. No rhinorrhea present. Oropharynx pink and moist, without exudate or edema. Edentulous - wears dentures. No lesions, ulcerations, or postnasal drip. Mallampati III NECK:  Supple w/ fair ROM.No lymphadenopathy.    CV: RRR, no m/r/g PULMONARY:  Unlabored, regular breathing. Clear bilaterally A&P w/o wheezes/rales/rhonchi. No accessory muscle use.  GI: BS present and normoactive. Soft, non-tender to palpation.  Neuro: A/Ox3. No focal deficits noted.   Skin: Warm, no lesions or rashe Psych: Normal affect and behavior. Judgement and thought  content appropriate.     Lab Results:  CBC    Component Value Date/Time   WBC 6.5 12/13/2021 1107   WBC 8.6 04/28/2021 1941   RBC 4.40 12/13/2021 1107   RBC 4.65 04/28/2021 1941   HGB 14.5 12/13/2021 1107   HCT 43.2 12/13/2021 1107   PLT 268 12/13/2021 1107   MCV 98 (H) 12/13/2021 1107   MCH 33.0 12/13/2021 1107   MCH 32.7 04/28/2021 1941   MCHC 33.6 12/13/2021 1107   MCHC 33.0 04/28/2021 1941   RDW 12.8 12/13/2021 1107   LYMPHSABS 2.7 06/30/2014 0755   MONOABS 0.6 06/30/2014 0755   EOSABS 0.3 06/30/2014 0755   BASOSABS 0.0 06/30/2014 0755    BMET    Component Value Date/Time   NA 142 12/13/2021 1107   K 4.6 12/13/2021 1107   CL 104 12/13/2021 1107   CO2 25 12/13/2021 1107   GLUCOSE 94 12/13/2021 1107   GLUCOSE 118 (H) 04/28/2021 1941   BUN 11 12/13/2021 1107   CREATININE 0.66 12/13/2021 1107   CALCIUM  9.4 12/13/2021 1107   GFRNONAA >60 04/28/2021 1941   GFRAA >90 06/30/2014 0755    BNP    Component Value Date/Time   BNP 16.9 06/30/2014 0755     Imaging:  No results found.   Administration History     None           No data to display          No results found for: NITRICOXIDE      Assessment & Plan:   Moderate obstructive sleep apnea Moderate OSA. Improved compliance and control. Residual AHI down to 6/h on average, which I suspect some of this reading is leaks. Significant improvement in symptoms. Utilizing nightly. Receives benefit from use. Advised to contact DME regarding machine issues. Working well during sleep. Reviewed risks of untreated OSA and encouraged continued nightly use. Safe driving  practices reviewed. Healthy weight loss encouraged.   Patient Instructions  Continue to use CPAP every night, minimum of 4-6 hours a night.  Change equipment as directed. Wash your tubing with warm soap and water daily, hang to dry. Wash humidifier portion weekly. Use bottled, distilled water and change daily Be aware of reduced alertness and do not drive or operate heavy machinery if experiencing this or drowsiness.  Exercise encouraged, as tolerated. Healthy weight management discussed.  Avoid or decrease alcohol consumption and medications that make you more sleepy, if possible. Notify if persistent daytime sleepiness occurs even with consistent use of PAP therapy.  Change CPAP supplies... Every month Mask cushions and/or nasal pillows CPAP machine filters Every 3 months Mask frame (not including the headgear) CPAP tubing Every 6 months Mask headgear Chin strap (if applicable) Humidifier water tub  Great job with increasing your CPAP use! Things look much better  Call adapt about the machine cutting on after you've turned it off if you don't hear from them   Follow up in one year, or sooner, if needed     I spent 25 minutes of dedicated to the care of this patient on the date of this encounter to include pre-visit review of records, face-to-face time with the patient discussing conditions above, post visit ordering of testing, clinical documentation with the electronic health record, making appropriate referrals as documented, and communicating necessary findings to members of the patients care team.  Shannon LULLA Rouleau, NP 04/02/2024  Pt aware and understands NP's role.

## 2024-04-02 NOTE — Patient Instructions (Signed)
 Continue to use CPAP every night, minimum of 4-6 hours a night.  Change equipment as directed. Wash your tubing with warm soap and water daily, hang to dry. Wash humidifier portion weekly. Use bottled, distilled water and change daily Be aware of reduced alertness and do not drive or operate heavy machinery if experiencing this or drowsiness.  Exercise encouraged, as tolerated. Healthy weight management discussed.  Avoid or decrease alcohol consumption and medications that make you more sleepy, if possible. Notify if persistent daytime sleepiness occurs even with consistent use of PAP therapy.  Change CPAP supplies... Every month Mask cushions and/or nasal pillows CPAP machine filters Every 3 months Mask frame (not including the headgear) CPAP tubing Every 6 months Mask headgear Chin strap (if applicable) Humidifier water tub  Great job with increasing your CPAP use! Things look much better  Call adapt about the machine cutting on after you've turned it off if you don't hear from them   Follow up in one year, or sooner, if needed

## 2024-04-02 NOTE — Assessment & Plan Note (Signed)
 Moderate OSA. Improved compliance and control. Residual AHI down to 6/h on average, which I suspect some of this reading is leaks. Significant improvement in symptoms. Utilizing nightly. Receives benefit from use. Advised to contact DME regarding machine issues. Working well during sleep. Reviewed risks of untreated OSA and encouraged continued nightly use. Safe driving practices reviewed. Healthy weight loss encouraged.   Patient Instructions  Continue to use CPAP every night, minimum of 4-6 hours a night.  Change equipment as directed. Wash your tubing with warm soap and water daily, hang to dry. Wash humidifier portion weekly. Use bottled, distilled water and change daily Be aware of reduced alertness and do not drive or operate heavy machinery if experiencing this or drowsiness.  Exercise encouraged, as tolerated. Healthy weight management discussed.  Avoid or decrease alcohol consumption and medications that make you more sleepy, if possible. Notify if persistent daytime sleepiness occurs even with consistent use of PAP therapy.  Change CPAP supplies... Every month Mask cushions and/or nasal pillows CPAP machine filters Every 3 months Mask frame (not including the headgear) CPAP tubing Every 6 months Mask headgear Chin strap (if applicable) Humidifier water tub  Great job with increasing your CPAP use! Things look much better  Call adapt about the machine cutting on after you've turned it off if you don't hear from them   Follow up in one year, or sooner, if needed

## 2024-04-03 NOTE — Addendum Note (Signed)
 Addended by: Yandriel Boening V on: 04/03/2024 01:18 PM   Modules accepted: Level of Service

## 2024-04-10 ENCOUNTER — Ambulatory Visit
Admission: RE | Admit: 2024-04-10 | Discharge: 2024-04-10 | Disposition: A | Discharge status: Home / Self Care | Source: Ambulatory Visit | Attending: Specialist | Admitting: Specialist

## 2024-04-10 DIAGNOSIS — Z1231 Encounter for screening mammogram for malignant neoplasm of breast: Secondary | ICD-10-CM

## 2024-06-05 ENCOUNTER — Telehealth: Payer: Self-pay | Admitting: Cardiology

## 2024-06-05 NOTE — Telephone Encounter (Signed)
 Pts daughter to state that patients insurance company is needing proof that she is indeed a heart pt. She is needing this documentation no later than 06/27/24, she would like a c/b regarding this matter. Please advise.

## 2024-06-05 NOTE — Telephone Encounter (Signed)
 Advised that we will check Monday for the form and complete.

## 2024-06-19 NOTE — Telephone Encounter (Signed)
 [employer] calling to get patient dx code to see if patient qualify for a particular program. Please advise

## 2024-06-19 NOTE — Telephone Encounter (Signed)
 Called the patient's [employer] insurance and answered there questions informing them that she was indeed a cardiac patient under Dr. Leandrew care. The representative at [facility] stated that the patient was good now and she would not lose her insurance.

## 2024-06-19 NOTE — Telephone Encounter (Signed)
 Called [employer] and informed them of the patient's diagnosis code for their CAD. The representative for [employer] had no further questions at this time.

## 2024-06-19 NOTE — Telephone Encounter (Signed)
 Daughter Towana) stated she returned RN's call.

## 2024-06-19 NOTE — Telephone Encounter (Signed)
 Called the patient's daughter Holley and informed her that the patient would not be losing her insurance and all of the questions that [employer] had been answered. Pam had no further questions at this time.

## 2024-06-23 NOTE — Telephone Encounter (Signed)
 Called Pam, the patient's daughter, and explained that [employer] called back and had one more question. That question was answered and the patient's insurance had no further questions. They stated that the patient would not be losing their insurance. Pam verbalized understanding and had no further questions at this time.

## 2024-06-29 ENCOUNTER — Other Ambulatory Visit: Payer: Self-pay

## 2024-07-01 ENCOUNTER — Encounter: Payer: Self-pay | Admitting: Cardiology

## 2024-07-01 ENCOUNTER — Ambulatory Visit: Payer: Self-pay | Admitting: Cardiology

## 2024-07-01 ENCOUNTER — Other Ambulatory Visit (HOSPITAL_BASED_OUTPATIENT_CLINIC_OR_DEPARTMENT_OTHER): Payer: Self-pay

## 2024-07-01 VITALS — BP 128/70 | HR 61 | Ht 63.0 in | Wt 173.4 lb

## 2024-07-01 DIAGNOSIS — E7800 Pure hypercholesterolemia, unspecified: Secondary | ICD-10-CM

## 2024-07-01 DIAGNOSIS — I10 Essential (primary) hypertension: Secondary | ICD-10-CM

## 2024-07-01 DIAGNOSIS — I25708 Atherosclerosis of coronary artery bypass graft(s), unspecified, with other forms of angina pectoris: Secondary | ICD-10-CM

## 2024-07-01 DIAGNOSIS — I471 Supraventricular tachycardia, unspecified: Secondary | ICD-10-CM | POA: Diagnosis not present

## 2024-07-01 DIAGNOSIS — I493 Ventricular premature depolarization: Secondary | ICD-10-CM

## 2024-07-01 DIAGNOSIS — I714 Abdominal aortic aneurysm, without rupture, unspecified: Secondary | ICD-10-CM | POA: Diagnosis not present

## 2024-07-01 MED ORDER — METOPROLOL TARTRATE 25 MG PO TABS
25.0000 mg | ORAL_TABLET | Freq: Two times a day (BID) | ORAL | 3 refills | Status: AC
  Filled 2024-07-01: qty 180, 90d supply, fill #0

## 2024-07-01 MED ORDER — EZETIMIBE 10 MG PO TABS
10.0000 mg | ORAL_TABLET | Freq: Every day | ORAL | 3 refills | Status: AC
  Filled 2024-07-01: qty 90, 90d supply, fill #0

## 2024-07-01 MED ORDER — ISOSORBIDE MONONITRATE ER 60 MG PO TB24
60.0000 mg | ORAL_TABLET | Freq: Every day | ORAL | 3 refills | Status: AC
  Filled 2024-07-01: qty 90, 90d supply, fill #0

## 2024-07-01 NOTE — Progress Notes (Signed)
 " Cardiology Office Note:    Date:  07/01/2024   ID:  MARIEN MANSHIP, DOB 1933/04/05, MRN [id number]  PCP:  Benson Eleanor Rung, NP  Cardiologist:  Redell Leiter, MD    Referring MD: Benson Eleanor Rung, NP    ASSESSMENT:    1. Abdominal aortic aneurysm (AAA) without rupture, unspecified part   2. Coronary artery disease of bypass graft of native heart with stable angina pectoris   3. Symptomatic PVCs   4. SVT (supraventricular tachycardia)   5. Primary hypertension   6. Hypercholesterolemia    PLAN:    In order of problems listed above:  After a nice discussion with the patient and her daughter and goals of treatment we decided she is not a candidate for elective intervention with her comorbidities personal choices and were not committed to further ultrasounds at this time Stable but having more frequent angina increase oral nitrate and initiate lipid-lowering therapy Not having symptomatic PVCs or SVT continue beta-blocker BP is well-controlled on current treatment   Next appointment: 6 months   Medication Adjustments/Labs and Tests Ordered: Current medicines are reviewed at length with the patient today.  Concerns regarding medicines are outlined above.  Orders Placed This Encounter  Procedures   EKG 12-Lead   No orders of the defined types were placed in this encounter.    History of Present Illness:    AMISHA POSPISIL is a 89 y.o. female with a hx of SVT symptomatic PVCs hypertension abdominal aortic aneurysm CAD with left main and multivessel CAD advise revascularization small chronic intracranial hemorrhage with recommendations discontinue dual antiplatelet therapy hyperlipidemia and statin intolerance last seen 01/02/2024.  At her last visit she declined any lipid-lowering therapy.  Although I had advised not to do an abdominal aortic aneurysm duplex because of age and comorbidities it was performed 01/10/2024 and showed progression with the largest  diameter 44 mm mid and distal abdominal aorta.  She had lipid profile in the last year 12/19/2023 cholesterol 155 LDL 89 non-HDL cholesterol 108.  At that time potassium 4.7 creatinine 0.7 GFR 82 cc/min CBC with a hemoglobin of 15.2 and platelet count 249,000.  Compliance with diet, lifestyle and medications: Yes  I spoke to the patient and her daughter and not sure how that vascular ultrasound was ordered but we decided not to do more testing at this point in time\her mother is not feeling as well she said she has had occasional angina relieved with rest or nitroglycerin  will increase the dose of her oral mononitrate and she does agree to lipid-lowering therapy nonstatin start Zetia  which should help I understand she is not a candidate for CABG Continue her good medical regimen including clopidogrel  oral nitrate increase dose beta-blocker and initiation of lipid-lowering treatment she agrees at this time Past Medical History:  Diagnosis Date   Acquired hypothyroidism 09/16/2015   Acute right ankle pain 08/23/2020   Allergic rhinitis due to pollen 11/02/2011   Aneurysm of abdominal aorta    Angina pectoris 03/20/2021   Anxiety 08/21/2017   Aortic atherosclerosis 05/16/2020   Arthritis, midfoot 09/16/2015   Bilateral carotid artery stenosis 12/09/2012   CAD (coronary artery disease) 04/30/2021   Calculus of kidney    Carpal tunnel syndrome of right wrist 06/22/2019   Chest pain 12/09/2012   Cystocele, lateral    Cystocele, midline    Dyspnea and respiratory abnormality 12/09/2012   Edema of left foot 06/12/2017   Elevated blood pressure reading 12/09/2012   Encounter for long-term (  current) use of aspirin  12/09/2012   Essential hypertension 12/09/2012   Last Assessment & Plan:   Relevant Hx:  Course:  Daily Update:  Today's Plan:this is stable for her at this time and will follow her     Electronically signed by: Eleanor Merlynn Lady, NP  11/16/15 1241     Gastroesophageal reflux  disease without esophagitis 08/23/2017   Headache 12/09/2012   Hematuria, unspecified    HTN (hypertension) 10/05/2013   Idiopathic chronic gout 02/05/2020   Left foot pain 06/12/2017   Malaise and fatigue 12/09/2012   Last Assessment & Plan:   Relevant Hx:  Course:  Daily Update:  Today's Plan:she is feeling her energy is returning for her of which she is really pleased with     Electronically signed by: Eleanor Merlynn Lady, NP  11/16/15 1247     Menopausal disorder 09/16/2015   Last Assessment & Plan:   Relevant Hx:  Course:  Daily Update:  Today's Plan:this is stable for her and discussed UTI and relationship with lack of estrogen her UA here today was clear for her which she was pleased with     Electronically signed by: Eleanor Merlynn Lady, NP  11/16/15 1246     Mild episode of recurrent major depressive disorder 12/06/2017   Mixed hyperlipidemia 09/16/2015   Moderate obstructive sleep apnea 10/06/2021   Morbid obesity (HCC)    Neoplasm of uncertain behavior of lip, oral cavity, 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   Nonspecific abnormal cardiovascular system function study 12/09/2012   NSTEMI (non-ST elevated myocardial infarction) (HCC) 04/24/2021   Obesity with body mass index of 30.0-39.9 09/16/2015   Last Assessment & Plan:   Relevant Hx:  Course:  Daily Update:  Today's Plan:she is doing the next 56 days and has lost 10 pounds of which she is very proud of as this is a lifestyle change for her and she is pleased with her efforts since she has never really been able to lose weight before and is hopeful she will be able to walk some with the weight loss     Electronically signed by: Eleanor Mini   Occlusion and stenosis of carotid artery 12/09/2012   Occlusion and stenosis of multiple and bilateral precerebral arteries 12/09/2012   Osteopenia 09/16/2015   Other abnormal glucose    Pain in right hand 05/07/2019   Palpitations 10/05/2013   Pre-syncope 10/05/2013   Prediabetes  09/16/2015   Primary osteoarthritis involving multiple joints 05/16/2020   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  Located infrarenal abdominal aorta and measured 3.4cm, previous 2.2 cm was last seen     Shortness of breath 12/09/2012   Statin myopathy 06/08/2021   Subfibular impingement of left lower extremity 06/12/2017   SVT (supraventricular tachycardia) 05/23/2017   Symptomatic PVCs 05/23/2017   Symptoms involving cardiovascular system 12/09/2012   Tumor of soft tissue of neck 09/16/2015   Last Assessment & Plan:   Relevant Hx:  Course:  Daily Update:  Today's Plan:follows through [facility] yearly and her last scan was stable for her      Electronically signed by: Eleanor Merlynn Lady, NP  11/16/15 1242     Unstable angina (HCC) 04/28/2021   Urethral caruncle 10/28/2019   Urge incontinence    Vaginal enterocele, congenital or acquired    Vitamin D deficiency     Current Medications: Active Medications[1]    EKGs/Labs/Other Studies Reviewed:    The following studies were  reviewed today:           EKG Interpretation Date/Time:  Wednesday July 01 2024 10:56:30 EST Ventricular Rate:  61 PR Interval:  140 QRS Duration:  82 QT Interval:  432 QTC Calculation: 434 R Axis:   -11  Text Interpretation: Normal sinus rhythm Inferior infarct (cited on or before 04-Jul-2023) When compared with ECG of 04-Jul-2023 08:04, T wave inversion no longer evident in Anterior leads Confirmed by Monetta Rogue ([id number]) on 07/01/2024 11:15:44 AM    Recent Labs: No results found for requested labs within last 365 days.  Recent Lipid Panel    Component Value Date/Time   CHOL 138 12/13/2021 1107   TRIG 202 (H) 12/13/2021 1107   HDL 38 (L) 12/13/2021 1107   CHOLHDL 3.6 12/13/2021 1107   CHOLHDL 4.9 04/26/2021 0338   VLDL 30 04/26/2021 0338   LDLCALC 66 12/13/2021 1107    Physical Exam:    VS:  BP 128/70   Pulse 61   Ht 5'  3 (1.6 m)   Wt 173 lb 6.4 oz (78.7 kg)   SpO2 97%   BMI 30.72 kg/m     Wt Readings from Last 3 Encounters:  07/01/24 173 lb 6.4 oz (78.7 kg)  04/02/24 170 lb 12.8 oz (77.5 kg)  01/23/24 169 lb 12.8 oz (77 kg)     GEN:  Well nourished, well developed in no acute distress HEENT: Normal NECK: No JVD; No carotid bruits LYMPHATICS: No lymphadenopathy CARDIAC: RRR, no murmurs, rubs, gallops RESPIRATORY:  Clear to auscultation without rales, wheezing or rhonchi  ABDOMEN: Soft, non-tender, non-distended MUSCULOSKELETAL:  No edema; No deformity  SKIN: Warm and dry NEUROLOGIC:  Alert and oriented x 3 PSYCHIATRIC:  Normal affect    Signed, Rogue Monetta, MD  07/01/2024 11:15 AM    [facility]      [1]  Current Meds  Medication Sig   ALPRAZolam  (XANAX ) 0.5 MG tablet Take 0.25 mg by mouth every 8 (eight) hours as needed for anxiety.   clopidogrel  (PLAVIX ) 75 MG tablet Take 1 tablet (75 mg total) by mouth daily.   cyanocobalamin 100 MCG tablet Take 100 mcg by mouth daily.   isosorbide  mononitrate (IMDUR ) 30 MG 24 hr tablet Take 1 tablet by mouth twice daily   levothyroxine  (SYNTHROID ) 50 MCG tablet Take 50 mcg by mouth daily before breakfast.   Magnesium  Gluconate 550 MG TABS Take 500 mg by mouth daily.   metoprolol  tartrate (LOPRESSOR ) 25 MG tablet Take 0.5 tablets (12.5 mg total) by mouth daily. (Patient taking differently: Take 25 mg by mouth 2 (two) times daily.)   nitroGLYCERIN  (NITROSTAT ) 0.4 MG SL tablet DISSOLVE ONE TABLET UNDER THE TONGUE EVERY 5 MINUTES AS NEEDED FOR CHEST PAIN.  DO NOT EXCEED A TOTAL OF 3 DOSES IN 15 MINUTES   Omega-3 Fatty Acids (FISH OIL) 300 MG CAPS Take by mouth.   "

## 2024-07-01 NOTE — Patient Instructions (Signed)
 Medication Instructions:  Your physician has recommended you make the following change in your medication:   START: Imdur  60 mg daily START: Zetia  10 mg daily START: Metoprolol  tartrate 25 mg two times daily  *If you need a refill on your cardiac medications before your next appointment, please call your pharmacy*  Lab Work: None If you have labs (blood work) drawn today and your tests are completely normal, you will receive your results only by: MyChart Message (if you have MyChart) OR A paper copy in the mail If you have any lab test that is abnormal or we need to change your treatment, we will call you to review the results.  Testing/Procedures: None  Follow-Up: At [facility], you and your health needs are our priority.  As part of our continuing mission to provide you with exceptional heart care, our providers are all part of one team.  This team includes your primary Cardiologist (physician) and Advanced Practice Providers or APPs (Physician Assistants and Nurse Practitioners) who all work together to provide you with the care you need, when you need it.  Your next appointment:   6 month(s)  Provider:   Redell Leiter, MD    We recommend signing up for the patient portal called MyChart.  Sign up information is provided on this After Visit Summary.  MyChart is used to connect with patients for Virtual Visits (Telemedicine).  Patients are able to view lab/test results, encounter notes, upcoming appointments, etc.  Non-urgent messages can be sent to your provider as well.   To learn more about what you can do with MyChart, go to [email or web address].   Other Instructions None

## 2024-07-02 NOTE — Addendum Note (Signed)
 Addended by: CLAUDENE NEVINS A on: 07/02/2024 01:54 PM   Modules accepted: Orders
